# Patient Record
Sex: Male | Born: 1980 | Race: White | Hispanic: No | Marital: Married | Smoking: Current every day smoker
Health system: Southern US, Community
[De-identification: ages and names within clinical notes are randomized; demographics above are authoritative.]

## PROBLEM LIST (undated history)

## (undated) DIAGNOSIS — N200 Calculus of kidney: Secondary | ICD-10-CM

## (undated) DIAGNOSIS — G5600 Carpal tunnel syndrome, unspecified upper limb: Secondary | ICD-10-CM

## (undated) DIAGNOSIS — S069XAA Unspecified intracranial injury with loss of consciousness status unknown, initial encounter: Secondary | ICD-10-CM

## (undated) HISTORY — PX: LITHOTRIPSY: SUR834

---

## 2011-03-01 ENCOUNTER — Emergency Department (HOSPITAL_COMMUNITY)
Admission: EM | Admit: 2011-03-01 | Discharge: 2011-03-01 | Disposition: A | Discharge status: Home / Self Care | Payer: Self-pay | Attending: Emergency Medicine | Admitting: Emergency Medicine

## 2011-03-01 DIAGNOSIS — R209 Unspecified disturbances of skin sensation: Secondary | ICD-10-CM | POA: Insufficient documentation

## 2011-03-01 DIAGNOSIS — G5600 Carpal tunnel syndrome, unspecified upper limb: Secondary | ICD-10-CM | POA: Insufficient documentation

## 2011-03-01 DIAGNOSIS — M79609 Pain in unspecified limb: Secondary | ICD-10-CM | POA: Insufficient documentation

## 2011-04-02 ENCOUNTER — Emergency Department (HOSPITAL_COMMUNITY)
Admission: EM | Admit: 2011-04-02 | Discharge: 2011-04-02 | Disposition: A | Discharge status: Home / Self Care | Payer: Self-pay | Attending: Emergency Medicine | Admitting: Emergency Medicine

## 2011-04-02 DIAGNOSIS — G5601 Carpal tunnel syndrome, right upper limb: Secondary | ICD-10-CM

## 2011-04-02 DIAGNOSIS — Z87442 Personal history of urinary calculi: Secondary | ICD-10-CM | POA: Insufficient documentation

## 2011-04-02 DIAGNOSIS — G5600 Carpal tunnel syndrome, unspecified upper limb: Secondary | ICD-10-CM | POA: Insufficient documentation

## 2011-04-02 HISTORY — DX: Carpal tunnel syndrome, unspecified upper limb: G56.00

## 2011-04-02 HISTORY — DX: Calculus of kidney: N20.0

## 2011-04-02 MED ORDER — PREDNISONE 20 MG PO TABS: ORAL_TABLET | ORAL | Status: AC

## 2011-04-02 MED ORDER — PREDNISONE 20 MG PO TABS
60.0000 mg | ORAL_TABLET | Freq: Every day | ORAL | Status: DC
  Administered 2011-04-02: 60 mg via ORAL
  Filled 2011-04-02: qty 3

## 2011-04-02 MED ORDER — LORAZEPAM 2 MG/ML IJ SOLN
2.0000 mg | Freq: Once | INTRAMUSCULAR | Status: AC
  Administered 2011-04-02: 2 mg via INTRAMUSCULAR
  Filled 2011-04-02: qty 1

## 2011-04-02 MED ORDER — HYDROMORPHONE HCL 2 MG/ML IJ SOLN
2.0000 mg | Freq: Once | INTRAMUSCULAR | Status: AC
  Administered 2011-04-02: 2 mg via INTRAMUSCULAR
  Filled 2011-04-02: qty 1

## 2011-04-02 NOTE — ED Notes (Signed)
Has known carpal tunnel, unable to get right hand open "its locked up", +etoh noted on pts breath

## 2011-04-02 NOTE — ED Provider Notes (Signed)
History     CSN: [id number] Arrival date & time: 04/02/2011  8:59 PM  Chief Complaint  Patient presents with  . Hand Pain    HPI  (Consider location/radiation/quality/duration/timing/severity/associated sxs/prior treatment) This 30 year old right-hand-dominant male has had several months of pain in both volar wrists with numbness to his right handin the distribution of the median nerve without weakness. He has been wearing splints for several weeks to both wrists. 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 The pain is from his right volar wrist up to his right elbow along his volar forearm and he has cramps and spasms in his right hand causing him to clench his hand in a fist position. 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 His left hand is now bothering him today but over the last few days his right hand has been cramping off and on in a fist position. There is no weakness to the right hand today his baseline numbness with no change in numbness there is no redness or swelling to his right hand or forearm. He has no trauma he has no neck pain chest pain shortness of breath or abdominal pain or back pain or other concerns. 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 He has not seen an orthopedic surgeon yet. His pain is again in his right wrist and radiates to his right hand and his right forearm with a severe throbbing constant pain for days at a time. HPI  Past Medical History  Diagnosis Date  . Carpal tunnel syndrome   . Kidney stones     Past Surgical History  Procedure Date  . Lithotripsy     No family history on file.  History  Substance Use Topics  . Smoking status: Current Everyday Smoker  . Smokeless tobacco: Not on file  . Alcohol Use: Yes     daiily      Review of Systems  Review of Systems    Constitutional: Negative for fever.       10 Systems reviewed and are negative for acute change except as noted in the HPI.  HENT: Negative for congestion.   Eyes: Negative for discharge and redness.  Respiratory: Negative for cough and shortness of breath.   Cardiovascular: Negative for chest pain.  Gastrointestinal: Negative for vomiting and abdominal pain.  Musculoskeletal: Negative for back pain.  Skin: Negative for rash.  Neurological: Positive for numbness. Negative for syncope, weakness and headaches.  Psychiatric/Behavioral:       No behavior change.    Allergies  Review of patient's allergies indicates no known allergies.  Home Medications   Current Outpatient Rx  Name Route Sig Dispense Refill  . ARTHRITIS PAIN FORMULA PO Oral Take 2 tablets by mouth 4 (four) times daily as needed. For pain     . DIAZEPAM 5 MG PO TABS Oral Take 1 tablet (5 mg total) by mouth every 8 (eight) hours as needed (muscle spasm). 15 tablet 0  . OXYCODONE-ACETAMINOPHEN 5-325 MG PO TABS Oral Take 1 tablet by mouth once. 20 tablet 0  . PREDNISONE 20 MG PO TABS  2 tabs po daily x 4 days 8 tablet 0    Physical Exam    BP 102/85  Pulse 92  Temp(Src) 98.2 F (36.8 C) (Oral)  Resp 20  Ht 5\' 11"  (1.803 m)  Wt 175 lb (79.379 kg)  BMI 24.41 kg/m2  SpO2 100%  Physical Exam  Nursing note and vitals reviewed. Constitutional:       Awake, alert, nontoxic appearance.  HENT:  Head: Atraumatic.  Eyes: Right eye exhibits no discharge. Left eye exhibits no discharge.  Neck: Neck supple.  Cardiovascular: Normal rate and regular rhythm.   No murmur heard. Pulmonary/Chest: Effort normal and breath sounds normal. No respiratory distress. He has no wheezes. He has no rales. He exhibits no tenderness.  Abdominal: Soft. There is no tenderness. There is no rebound.  Musculoskeletal: He exhibits tenderness. He exhibits no edema.       Baseline ROM except right hand and wrist, no obvious new focal weakness.   His left arm and both legs are nontender. His right arm is nontender the shoulder upper arm and elbow. His right forearm is tender mildly to the volar forearm he also is mild tenderness to the right volar wrist he has no tenderness to the right hand but holds his right hand clenched fist and flexes against resistance when I passively tried to extend any of his digits of his right hand. His capillary refill less than 2 seconds to all digits to the right hand with slight decreased light touch to the right thumb index long and part of the ring finger and the median nerve distribution. He has normal light touch to the right small finger.  Neurological:       Mental status and motor strength appears baseline for patient and situation.  Skin: No rash noted.  Psychiatric: He has a normal mood and affect.    ED Course  Procedures (including critical care time)  Labs Reviewed - No data to display    1. Carpal tunnel syndrome of right wrist      MDM         Hurman Horn, MD 04/10/11 1115

## 2011-04-02 NOTE — ED Notes (Signed)
Pt states was seen at [facility] & dx w/ carpal tunnel, was seen in [place] & given valium. Pt states took 1 valium tab this morning & another again tonight. Pt right hand is clenched & pt states he is unable to open.

## 2011-04-05 ENCOUNTER — Emergency Department (HOSPITAL_COMMUNITY)
Admission: EM | Admit: 2011-04-05 | Discharge: 2011-04-05 | Disposition: A | Discharge status: Home / Self Care | Payer: Self-pay | Attending: Emergency Medicine | Admitting: Emergency Medicine

## 2011-04-05 ENCOUNTER — Encounter (HOSPITAL_COMMUNITY): Payer: Self-pay

## 2011-04-05 DIAGNOSIS — M62838 Other muscle spasm: Secondary | ICD-10-CM | POA: Insufficient documentation

## 2011-04-05 DIAGNOSIS — G5600 Carpal tunnel syndrome, unspecified upper limb: Secondary | ICD-10-CM | POA: Insufficient documentation

## 2011-04-05 DIAGNOSIS — Z87442 Personal history of urinary calculi: Secondary | ICD-10-CM | POA: Insufficient documentation

## 2011-04-05 DIAGNOSIS — F17200 Nicotine dependence, unspecified, uncomplicated: Secondary | ICD-10-CM | POA: Insufficient documentation

## 2011-04-05 MED ORDER — DIAZEPAM 5 MG PO TABS
5.0000 mg | ORAL_TABLET | Freq: Once | ORAL | Status: AC
  Administered 2011-04-05: 5 mg via ORAL
  Filled 2011-04-05: qty 1

## 2011-04-05 MED ORDER — DIAZEPAM 5 MG PO TABS: 5.0000 mg | ORAL_TABLET | Freq: Three times a day (TID) | ORAL | Status: AC | PRN

## 2011-04-05 MED ORDER — OXYCODONE-ACETAMINOPHEN 5-325 MG PO TABS: 1.0000 | ORAL_TABLET | Freq: Once | ORAL | Status: AC

## 2011-04-05 MED ORDER — KETOROLAC TROMETHAMINE 60 MG/2ML IM SOLN
60.0000 mg | Freq: Once | INTRAMUSCULAR | Status: AC
  Administered 2011-04-05: 60 mg via INTRAMUSCULAR
  Filled 2011-04-05: qty 2

## 2011-04-05 MED ORDER — HYDROMORPHONE HCL 1 MG/ML IJ SOLN
1.0000 mg | Freq: Once | INTRAMUSCULAR | Status: AC
  Administered 2011-04-05: 1 mg via INTRAMUSCULAR
  Filled 2011-04-05: qty 1

## 2011-04-05 MED ORDER — OXYCODONE-ACETAMINOPHEN 5-325 MG PO TABS
1.0000 | ORAL_TABLET | Freq: Once | ORAL | Status: AC
  Administered 2011-04-05: 1 via ORAL
  Filled 2011-04-05: qty 1

## 2011-04-05 NOTE — ED Notes (Signed)
Pt states he was recently seen and treated for carpal tunnel in rt hand. Pt states " my hand locks up" pt states he is unable to use hand. Pt states thumb and 2 fingers are numb.

## 2011-04-12 NOTE — ED Provider Notes (Signed)
History     CSN: [id number] Arrival date & time: 04/05/2011  8:29 PM  Chief Complaint  Patient presents with  . Hand Pain    (Consider location/radiation/quality/duration/timing/severity/associated sxs/prior treatment) Patient is a 30 y.o. male presenting with hand pain. The history is provided by the patient.  Hand Pain This is a recurrent problem. The current episode started 1 to 4 weeks ago. The problem occurs intermittently. The problem has been gradually worsening (he describes intermittent pain with "locking" up of his fingers and wrist intermittently for the past month.  He was seen here and diagnosed with carpal tunnel syndrome.    He has not followed up with any specialists yet.). Associated symptoms include arthralgias and numbness. Pertinent negatives include no abdominal pain, chest pain, congestion, fever, headaches, joint swelling, nausea, neck pain, rash, sore throat or weakness. Associated symptoms comments: He was here while his girlfriend is being treated in the ed when he developed a new muscle spasm in his hand,  So decided to be re-evaluated.  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  He also has complaint of numbness in his hands and fingertips which wakes him at night.  When his right hand spasms,  He states it spasm in the position of carrying a hammer.. Exacerbated by: Overuse. Treatments tried: He has had wrist splints for the past month without relief,  mainly wears at night..  He denies trauma.   The treatment provided no relief.    Past Medical History  Diagnosis Date  . Carpal tunnel syndrome   . Kidney stones     Past Surgical History  Procedure Date  . Lithotripsy     No family history on file.  History  Substance Use Topics  . Smoking status: Current Everyday Smoker  . Smokeless tobacco: Not on file  . Alcohol Use: Yes     daiily      Review of Systems  Constitutional: Negative for fever.  HENT: Negative for  congestion, sore throat and neck pain.   Eyes: Negative.   Respiratory: Negative for chest tightness and shortness of breath.   Cardiovascular: Negative for chest pain.  Gastrointestinal: Negative for nausea and abdominal pain.  Genitourinary: Negative.   Musculoskeletal: Positive for arthralgias. Negative for joint swelling.  Skin: Negative.  Negative for rash and wound.  Neurological: Positive for numbness. Negative for dizziness, weakness, light-headedness and headaches.  Hematological: Negative.   Psychiatric/Behavioral: Negative.     Allergies  Review of patient's allergies indicates no known allergies.  Home Medications   Current Outpatient Rx  Name Route Sig Dispense Refill  . ARTHRITIS PAIN FORMULA PO Oral Take 2 tablets by mouth 4 (four) times daily as needed. For pain     . DIAZEPAM 5 MG PO TABS Oral Take 1 tablet (5 mg total) by mouth every 8 (eight) hours as needed (muscle spasm). 15 tablet 0  . OXYCODONE-ACETAMINOPHEN 5-325 MG PO TABS Oral Take 1 tablet by mouth once. 20 tablet 0  . PREDNISONE 20 MG PO TABS  2 tabs po daily x 4 days 8 tablet 0    BP 129/73  Pulse 98  Temp(Src) 97.9 F (36.6 C) (Oral)  Resp 16  Ht 5\' 11"  (1.803 m)  Wt 175 lb (79.379 kg)  BMI 24.41 kg/m2  SpO2 99%  Physical Exam  Nursing note and vitals reviewed. Constitutional: He is oriented to person, place, and time. He appears well-developed and well-nourished.  HENT:  Head: Normocephalic and atraumatic.  Eyes: Conjunctivae are normal.  Neck: Normal range of motion.  Cardiovascular: Normal rate and intact distal pulses.   Pulmonary/Chest: Effort normal.  Abdominal: Soft. There is no tenderness.  Musculoskeletal: He exhibits tenderness. He exhibits no edema.       Right hand: He exhibits decreased range of motion and tenderness. He exhibits normal capillary refill and no swelling. normal sensation noted. Normal strength noted.       Hand and fingers of right hand held in flexion with  active resistance with passive attempt to extend digits and wrist.  Cap refill less than 2 seconds.  Mild ttp volar wrist and hand.  Slight decreased sensation to all digits and volar hand.   Neurological: He is alert and oriented to person, place, and time.  Skin: Skin is warm and dry.  Psychiatric: He has a normal mood and affect.    ED Course  Procedures (including critical care time)  Labs Reviewed - No data to display No results found.   1. Muscle spasm       MDM  Muscle spasm,  Probable carpal tunnel.  Referral to neurology for emg testing.  Patient given valium 5 mg PO in ed with little relief.  Dilaudid 1 mg IM given with improved pain and flexibility after tx.          Candis Musa, PA 04/13/11 [phone]

## 2011-04-16 NOTE — ED Provider Notes (Signed)
Medical screening examination/treatment/procedure(s) were performed by non-physician practitioner and as supervising physician I was immediately available for consultation/collaboration.   Geoffery Lyons, MD 04/16/11 2029

## 2011-10-20 ENCOUNTER — Emergency Department (HOSPITAL_COMMUNITY)
Admission: EM | Admit: 2011-10-20 | Discharge: 2011-10-20 | Disposition: A | Discharge status: Home / Self Care | Payer: Self-pay | Attending: Emergency Medicine | Admitting: Emergency Medicine

## 2011-10-20 ENCOUNTER — Emergency Department (HOSPITAL_COMMUNITY): Payer: Self-pay

## 2011-10-20 ENCOUNTER — Encounter (HOSPITAL_COMMUNITY): Payer: Self-pay | Admitting: *Deleted

## 2011-10-20 DIAGNOSIS — R296 Repeated falls: Secondary | ICD-10-CM | POA: Insufficient documentation

## 2011-10-20 DIAGNOSIS — Y92009 Unspecified place in unspecified non-institutional (private) residence as the place of occurrence of the external cause: Secondary | ICD-10-CM | POA: Insufficient documentation

## 2011-10-20 DIAGNOSIS — S62339A Displaced fracture of neck of unspecified metacarpal bone, initial encounter for closed fracture: Secondary | ICD-10-CM

## 2011-10-20 DIAGNOSIS — S62309A Unspecified fracture of unspecified metacarpal bone, initial encounter for closed fracture: Secondary | ICD-10-CM | POA: Insufficient documentation

## 2011-10-20 DIAGNOSIS — F17200 Nicotine dependence, unspecified, uncomplicated: Secondary | ICD-10-CM | POA: Insufficient documentation

## 2011-10-20 DIAGNOSIS — G5600 Carpal tunnel syndrome, unspecified upper limb: Secondary | ICD-10-CM | POA: Insufficient documentation

## 2011-10-20 IMAGING — CR DG HAND COMPLETE 3+V*R*
3 series · 3 of 3 positions shown · non-contrast
Comparison: None.

CLINICAL DATA: The patient states fifth metacarpal fracture 2
months ago, fall today.

RIGHT HAND - COMPLETE 3+ VIEW

[x hand pa right]
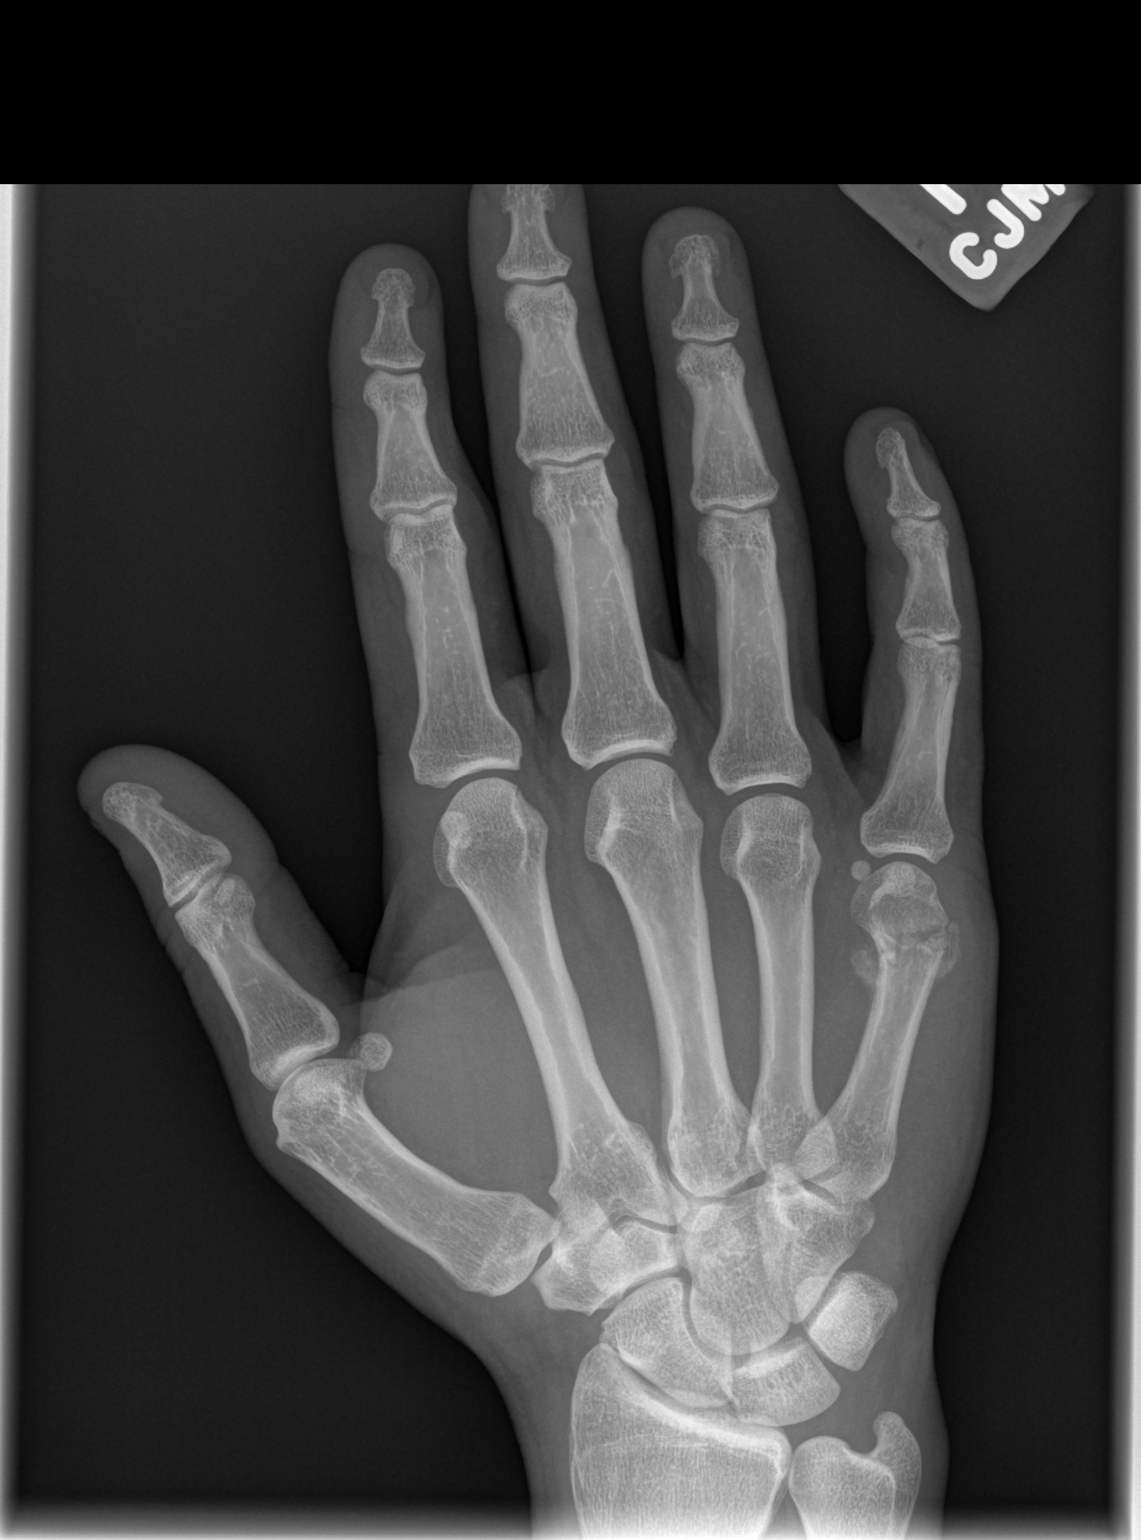

[x hand oblique right]
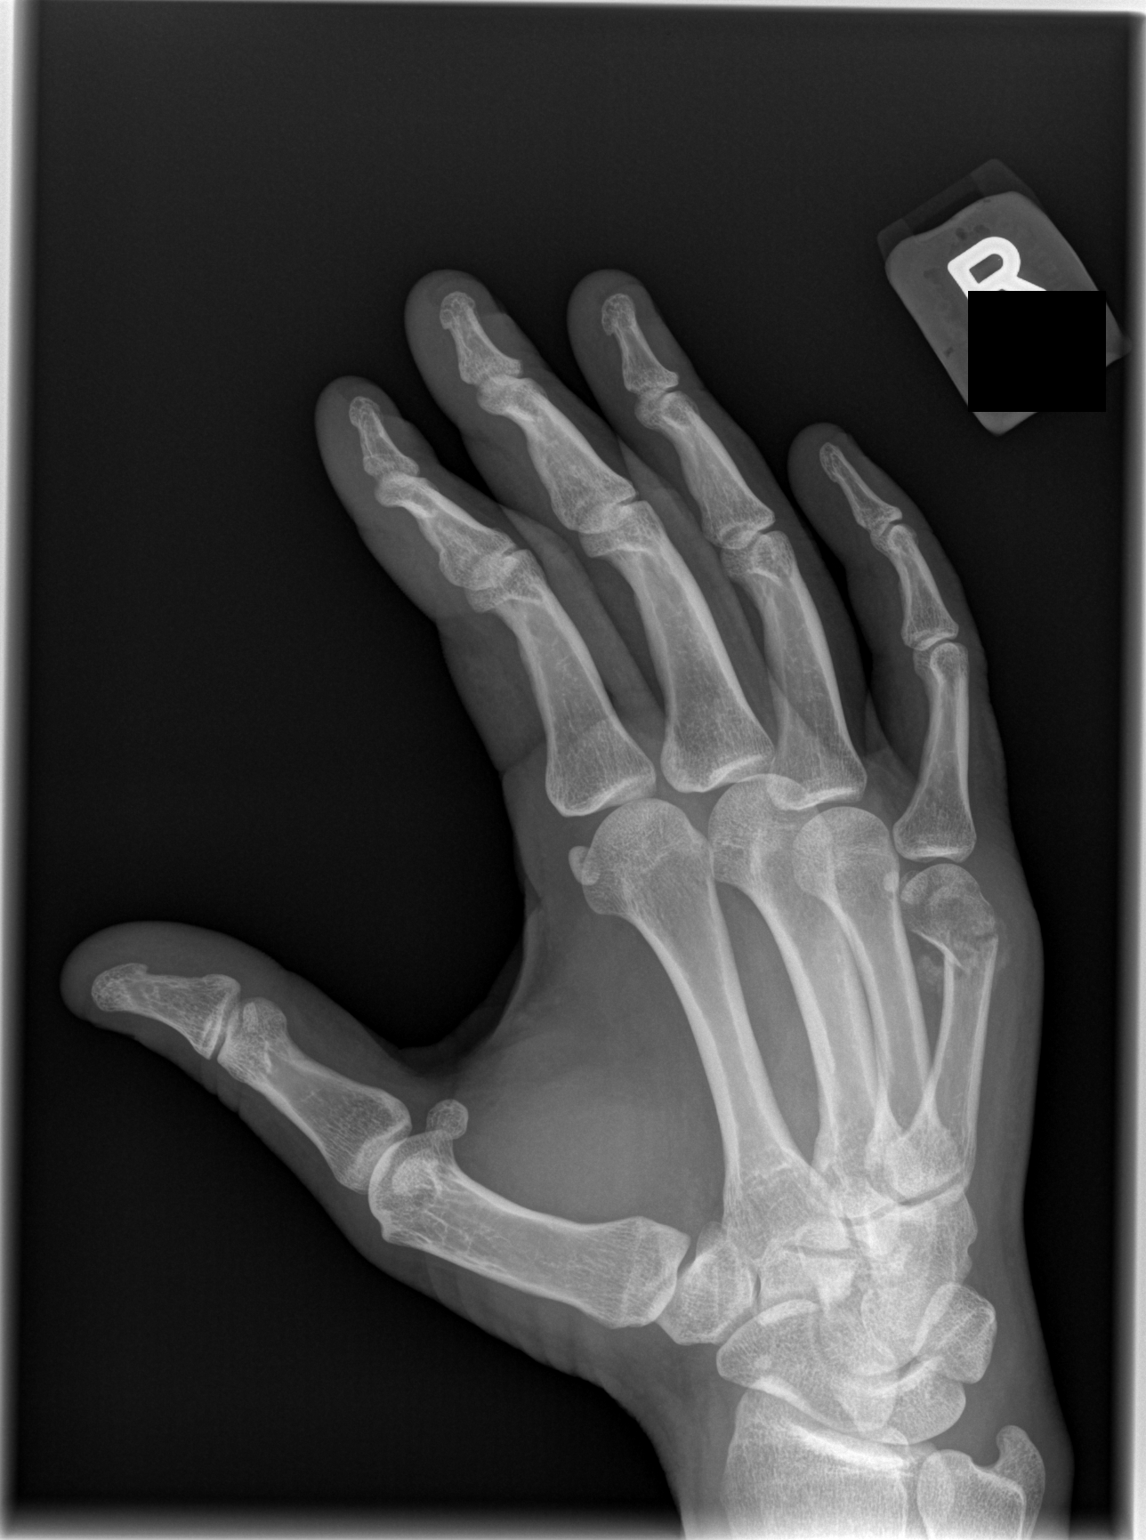

[x hand lat right]
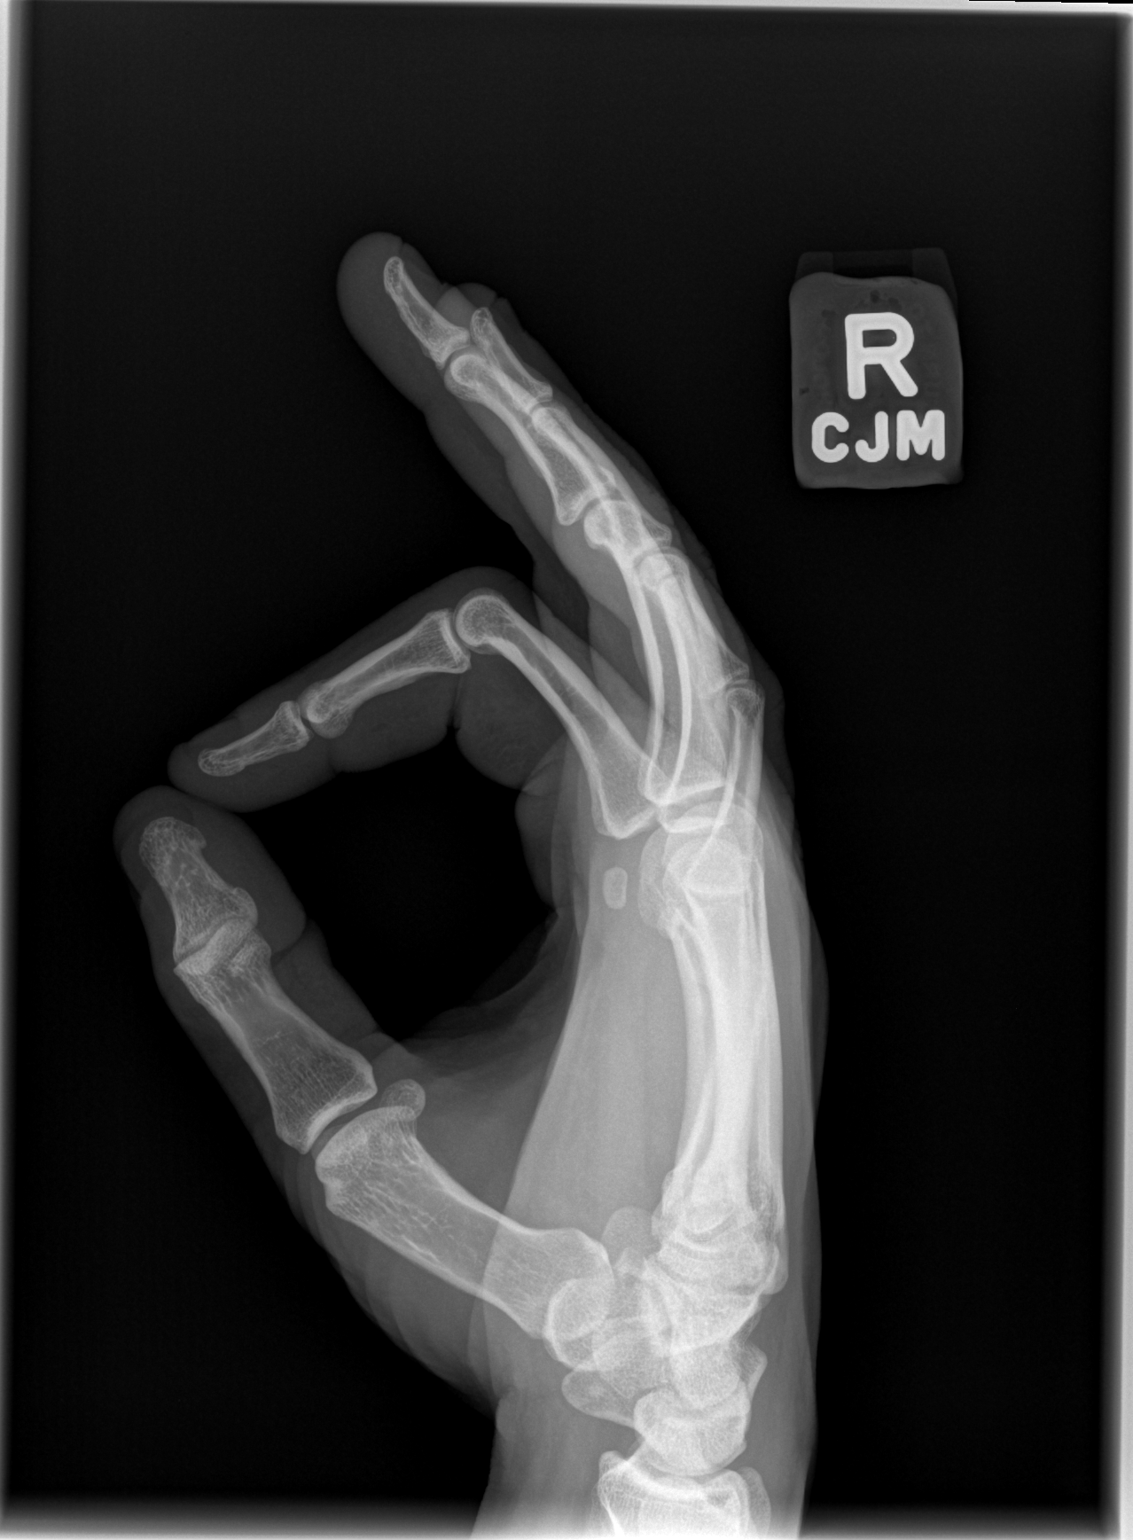

[3 of 3 positions shown; findings below may reference images not displayed]

FINDINGS: There is a subacute appearing fracture of the fifth
metacarpal neck.  This is incompletely healed with persistent
lucency and apex dorsal angulation.  There is some surrounding
callus formation.  There is no evidence of acute fracture or
dislocation.
IMPRESSION: Subacute boxer's fracture with incomplete healing and mild
angulation as described.

## 2011-10-20 MED ORDER — HYDROCODONE-ACETAMINOPHEN 5-325 MG PO TABS: 1.0000 | ORAL_TABLET | Freq: Four times a day (QID) | ORAL | Status: AC | PRN

## 2011-10-20 NOTE — ED Notes (Signed)
No answer x1

## 2011-10-20 NOTE — ED Notes (Signed)
The pt has rt hand pain that was unjured one month ago and he reinjured it again today. While wrestling.  Pain over the rt 4th and 5th metacarpal

## 2011-10-20 NOTE — ED Notes (Signed)
Ortho tech paged for splint.

## 2011-10-20 NOTE — ED Notes (Signed)
Ice pack given

## 2011-10-20 NOTE — Discharge Instructions (Signed)
Follow up with dr. Mina Marble next week.

## 2011-10-20 NOTE — ED Provider Notes (Cosign Needed)
History     CSN: [id number]  Arrival date & time 10/20/11  1728   First MD Initiated Contact with Patient 10/20/11 1912      Chief Complaint  Patient presents with  . Hand Injury    (Consider location/radiation/quality/duration/timing/severity/associated sxs/prior treatment) Patient is a 31 y.o. male presenting with hand injury. The history is provided by the patient (The patient states that he broke his hand couple weeks ago and then fell on his hand again today and had severe pain to his right hand over his fifth Mcp). No language interpreter was used.  Hand Injury  The incident occurred yesterday. The incident occurred at home. The injury mechanism was a fall. The pain is present in the right hand. The quality of the pain is described as sharp. The pain is at a severity of 5/10. The pain is moderate. The pain has been constant since the incident. Pertinent negatives include no fever. He reports no foreign bodies present. The symptoms are aggravated by movement. He has tried nothing for the symptoms. The treatment provided moderate relief.    Past Medical History  Diagnosis Date  . Carpal tunnel syndrome   . Kidney stones     Past Surgical History  Procedure Date  . Lithotripsy     No family history on file.  History  Substance Use Topics  . Smoking status: Current Everyday Smoker  . Smokeless tobacco: Not on file  . Alcohol Use: Yes     daiily      Review of Systems  Constitutional: Negative for fever and fatigue.  HENT: Negative for congestion, sinus pressure and ear discharge.   Eyes: Negative for discharge.  Respiratory: Negative for cough.   Cardiovascular: Negative for chest pain.  Gastrointestinal: Negative for abdominal pain and diarrhea.  Genitourinary: Negative for frequency and hematuria.  Musculoskeletal: Negative for back pain.  Skin: Negative for rash.  Neurological: Negative for seizures and headaches.  Hematological: Negative.     Psychiatric/Behavioral: Negative for hallucinations.    Allergies  Review of patient's allergies indicates no known allergies.  Home Medications   Current Outpatient Rx  Name Route Sig Dispense Refill  . HYDROCODONE-ACETAMINOPHEN 5-325 MG PO TABS Oral Take 1 tablet by mouth every 6 (six) hours as needed for pain. 20 tablet 0    BP 113/71  Pulse 98  Temp(Src) 98.4 F (36.9 C) (Oral)  Resp 18  SpO2 98%  Physical Exam  Constitutional: He is oriented to person, place, and time. He appears well-developed.  HENT:  Head: Normocephalic.  Eyes: Conjunctivae are normal.  Neck: No tracheal deviation present.  Cardiovascular:  No murmur heard. Musculoskeletal:       Tender swelling over right mcp  Neurological: He is oriented to person, place, and time.  Skin: Skin is warm.  Psychiatric: He has a normal mood and affect.    ED Course  Procedures (including critical care time)  Labs Reviewed - No data to display Dg Hand Complete Right  10/20/2011  *RADIOLOGY REPORT*  Clinical Data: The patient states fifth metacarpal fracture 2 months ago, fall today.  RIGHT HAND - COMPLETE 3+ VIEW  Comparison: None.  Findings: There is a subacute appearing fracture of the fifth metacarpal neck.  This is incompletely healed with persistent lucency and apex dorsal angulation.  There is some surrounding callus formation.  There is no evidence of acute fracture or dislocation.  IMPRESSION: Subacute boxer's fracture with incomplete healing and mild angulation as described.  Original Report Authenticated  By: Gerrianne Scale, M.D.     1. Boxer's fracture       MDM          Benny Lennert, MD 10/20/11 [phone]

## 2011-10-20 NOTE — Progress Notes (Signed)
Orthopedic Tech Progress Note Patient Details:  Yama Nielson Oct 31, 1980 [id number]  Type of Splint: Other (comment) (ulna gutter) Splint Location: (R) UE Splint Interventions: Application    Jennye Moccasin 10/20/2011, 8:19 PM

## 2011-11-22 ENCOUNTER — Emergency Department (HOSPITAL_COMMUNITY)
Admission: EM | Admit: 2011-11-22 | Discharge: 2011-11-22 | Discharge status: Against Medical Advice | Payer: Self-pay | Attending: Emergency Medicine | Admitting: Emergency Medicine

## 2011-11-22 ENCOUNTER — Encounter (HOSPITAL_COMMUNITY): Payer: Self-pay | Admitting: *Deleted

## 2011-11-22 DIAGNOSIS — R079 Chest pain, unspecified: Secondary | ICD-10-CM | POA: Insufficient documentation

## 2011-11-22 LAB — DIFFERENTIAL
Basophils Relative: 0 % (ref 0–1)
Eosinophils Relative: 2 % (ref 0–5)
Monocytes Absolute: 0.5 10*3/uL (ref 0.1–1.0)
Monocytes Relative: 5 % (ref 3–12)
Neutro Abs: 7.6 10*3/uL (ref 1.7–7.7)

## 2011-11-22 LAB — RAPID URINE DRUG SCREEN, HOSP PERFORMED
Barbiturates: NOT DETECTED
Benzodiazepines: NOT DETECTED
Cocaine: NOT DETECTED
Opiates: NOT DETECTED

## 2011-11-22 LAB — COMPREHENSIVE METABOLIC PANEL
Albumin: 4.3 g/dL (ref 3.5–5.2)
BUN: 6 mg/dL (ref 6–23)
CO2: 23 mEq/L (ref 19–32)
Calcium: 9.4 mg/dL (ref 8.4–10.5)
Chloride: 104 mEq/L (ref 96–112)
Creatinine, Ser: 0.84 mg/dL (ref 0.50–1.35)
GFR calc non Af Amer: >90 mL/min (ref >90)
Total Bilirubin: 0.2 mg/dL — ABNORMAL LOW (ref 0.3–1.2)

## 2011-11-22 LAB — URINALYSIS, ROUTINE W REFLEX MICROSCOPIC
Leukocytes, UA: NEGATIVE
Nitrite: NEGATIVE
Specific Gravity, Urine: 1.007 (ref 1.005–1.030)
Urobilinogen, UA: 0.2 mg/dL (ref 0.0–1.0)
pH: 6 (ref 5.0–8.0)

## 2011-11-22 LAB — CBC
HCT: 46.6 % (ref 39.0–52.0)
Hemoglobin: 16.5 g/dL (ref 13.0–17.0)
MCH: 32.7 pg (ref 26.0–34.0)
MCHC: 35.4 g/dL (ref 30.0–36.0)
MCV: 92.3 fL (ref 78.0–100.0)

## 2011-11-22 LAB — URINE MICROSCOPIC-ADD ON

## 2011-11-22 NOTE — ED Notes (Signed)
The pt is c/o pain in his back.  He has chest pain after he started hyperventalting  With numbness and tingling in his arms and hands.  He has multiple complaints

## 2021-06-30 ENCOUNTER — Emergency Department (HOSPITAL_COMMUNITY)
Admission: EM | Admit: 2021-06-30 | Discharge: 2021-07-01 | Disposition: A | Discharge status: Home / Self Care | Payer: Self-pay | Attending: Emergency Medicine | Admitting: Emergency Medicine

## 2021-06-30 ENCOUNTER — Other Ambulatory Visit: Payer: Self-pay

## 2021-06-30 DIAGNOSIS — F17200 Nicotine dependence, unspecified, uncomplicated: Secondary | ICD-10-CM | POA: Insufficient documentation

## 2021-06-30 DIAGNOSIS — N200 Calculus of kidney: Secondary | ICD-10-CM | POA: Insufficient documentation

## 2021-06-30 DIAGNOSIS — R531 Weakness: Secondary | ICD-10-CM

## 2021-06-30 DIAGNOSIS — G609 Hereditary and idiopathic neuropathy, unspecified: Secondary | ICD-10-CM | POA: Insufficient documentation

## 2021-06-30 DIAGNOSIS — M5136 Other intervertebral disc degeneration, lumbar region: Secondary | ICD-10-CM | POA: Insufficient documentation

## 2021-07-01 ENCOUNTER — Emergency Department (HOSPITAL_COMMUNITY): Payer: Self-pay

## 2021-07-01 ENCOUNTER — Encounter (HOSPITAL_COMMUNITY): Payer: Self-pay

## 2021-07-01 ENCOUNTER — Other Ambulatory Visit: Payer: Self-pay

## 2021-07-01 LAB — URINALYSIS, ROUTINE W REFLEX MICROSCOPIC
Glucose, UA: NEGATIVE mg/dL
Hgb urine dipstick: NEGATIVE
Ketones, ur: NEGATIVE mg/dL
Leukocytes,Ua: NEGATIVE
Nitrite: NEGATIVE
Protein, ur: 30 mg/dL — AB
Specific Gravity, Urine: >1.03 — ABNORMAL HIGH (ref 1.005–1.030)
pH: 6.5 (ref 5.0–8.0)

## 2021-07-01 LAB — URINALYSIS, MICROSCOPIC (REFLEX)

## 2021-07-01 IMAGING — CT CT RENAL STONE PROTOCOL
2 of 4 series · 16 of 46 positions shown, 18 images · non-contrast
Comparison: None.

CLINICAL DATA: Nephrolithiasis, back pain

EXAM:
CT ABDOMEN AND PELVIS WITHOUT CONTRAST
TECHNIQUE: Multidetector CT imaging of the abdomen and pelvis was performed
following the standard protocol without IV contrast.

[Series 3: ap without · axial · non-contrast · 0.82mm/px · z∈[+987,+1412]mm · 13 of 96 slices shown, 15 images]
[im 6/96  soft-tissue]
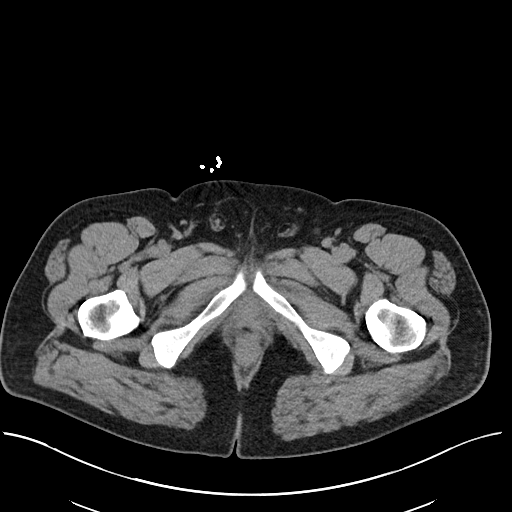
[im 6/96  bone]
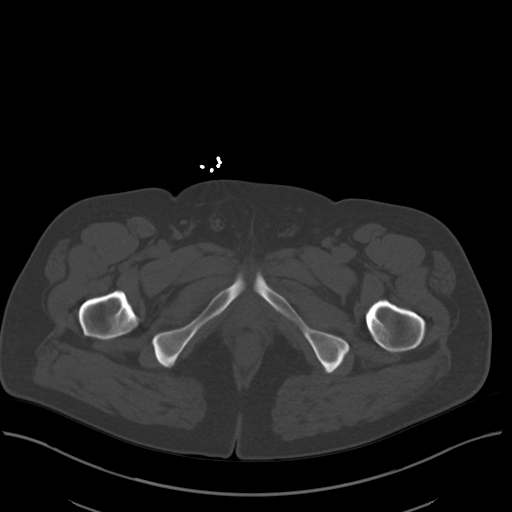
[im 16/96  soft-tissue]
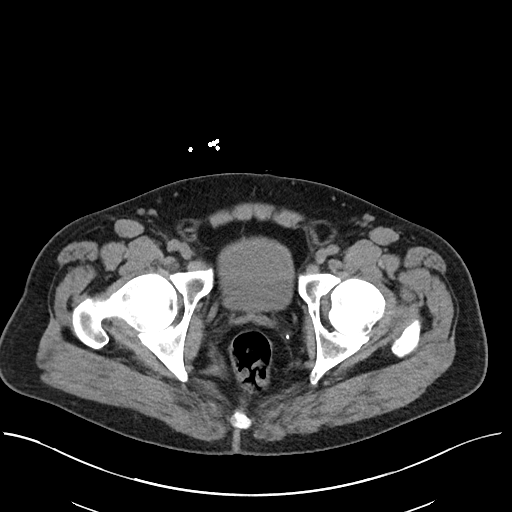
[im 21/96  soft-tissue]
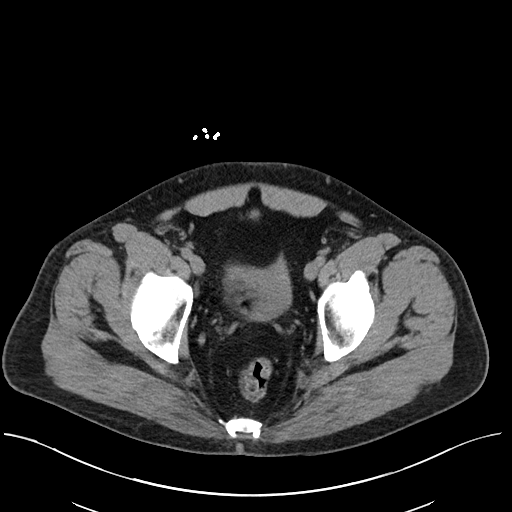
[im 26/96  soft-tissue]
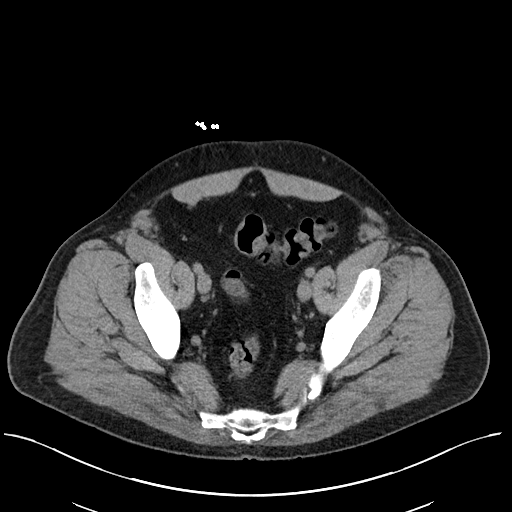
[im 36/96  soft-tissue]
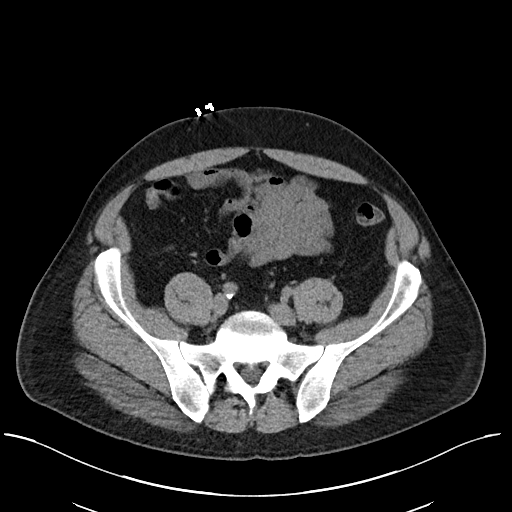
[im 41/96  soft-tissue]
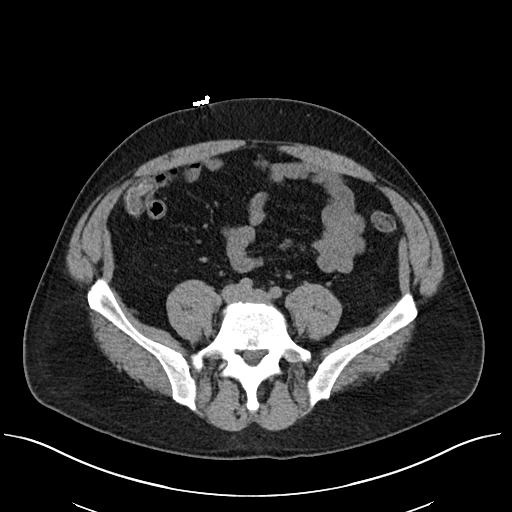
[im 51/96  soft-tissue]
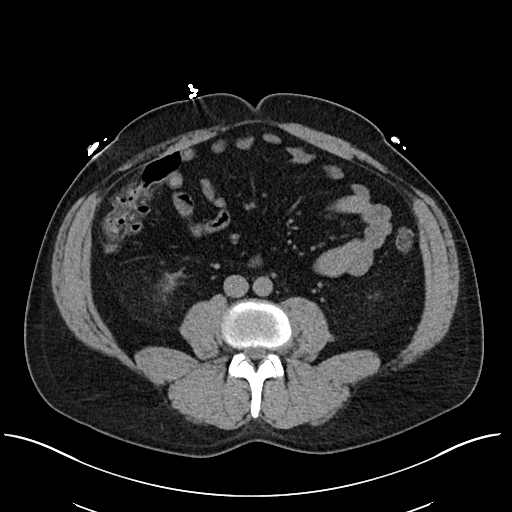
[im 56/96  soft-tissue]
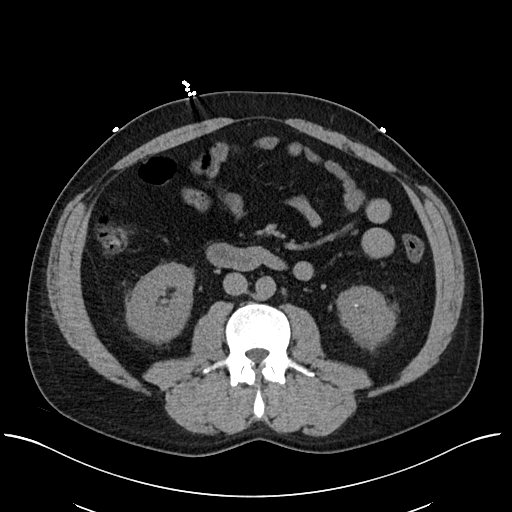
[im 61/96  soft-tissue]
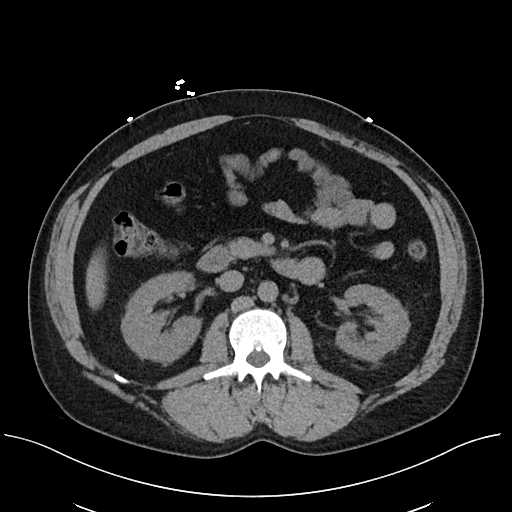
[im 61/96  bone]
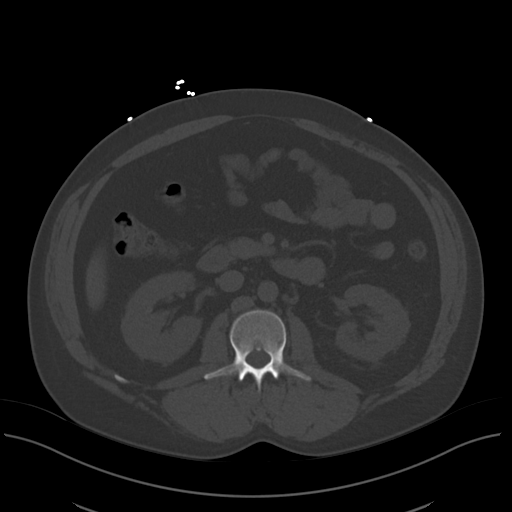
[im 71/96  soft-tissue]
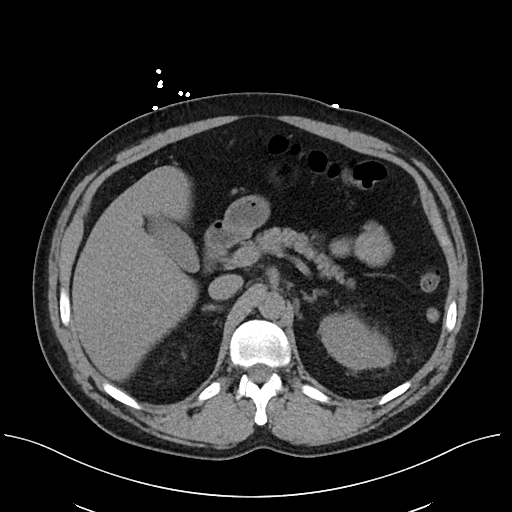
[im 76/96  soft-tissue]
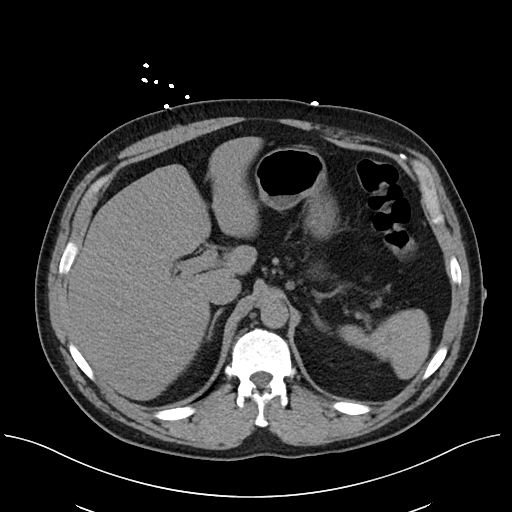
[im 81/96  soft-tissue]
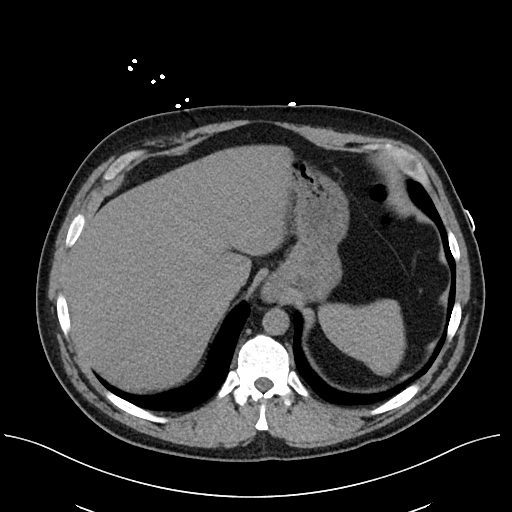
[im 91/96  soft-tissue]
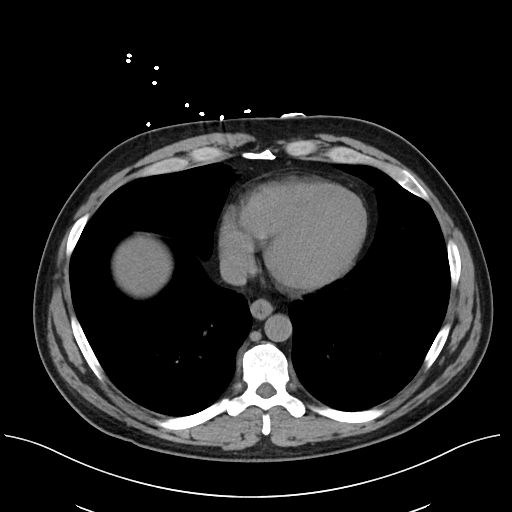

[Series 6: cor · coronal · 0.80mm/px · 3 of 105 slices shown]
[im 35/105  soft-tissue]
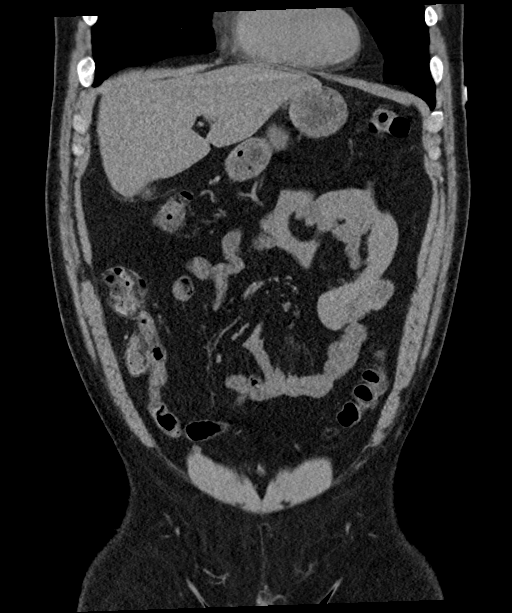
[im 47/105  soft-tissue]
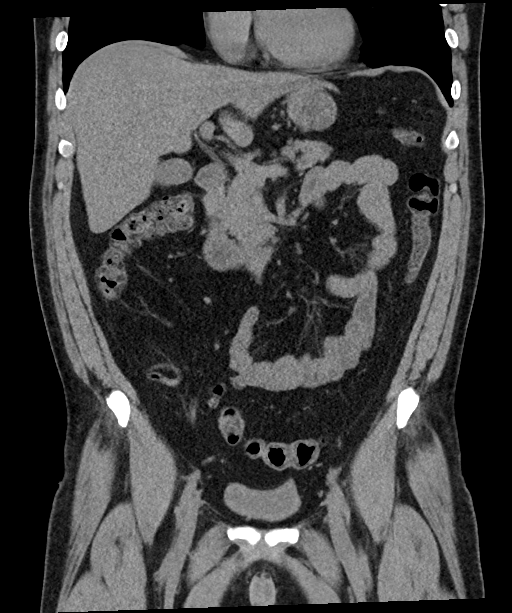
[im 58/105  soft-tissue]
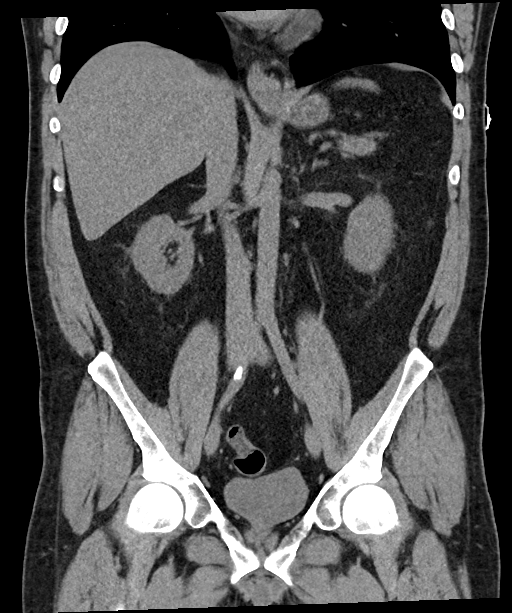

[16 of 46 positions shown; findings below may reference images not displayed]

FINDINGS: Lower chest: No acute abnormality.

Hepatobiliary: No focal liver abnormality is seen. No gallstones,
gallbladder wall thickening, or biliary dilatation.

Pancreas: Unremarkable. No pancreatic ductal dilatation or
surrounding inflammatory changes.

Spleen: Normal in size without focal abnormality.

Adrenals/Urinary Tract: Normal adrenal glands. 3 mm nonobstructing
right renal calculus. 3 mm nonobstructing left renal calculus. No
obstructive uropathy. No ureteral calculi. No renal mass. Normal
bladder.

Stomach/Bowel: Stomach is within normal limits. Appendix appears
normal. No evidence of bowel wall thickening, distention, or
inflammatory changes.

Vascular/Lymphatic: No significant vascular findings are present. No
enlarged abdominal or pelvic lymph nodes.

Reproductive: Prostate is unremarkable.

Other: No abdominal wall hernia or abnormality. No abdominopelvic
ascites.

Musculoskeletal: No acute or significant osseous findings. 7 mm
right iliac sclerotic bone lesion consistent with a bone island.
Indeterminate 12 mm sclerotic bone lesion with central lucency in
the right posterior ilium, which likely reflects a benign
fibro-osseous lesion, but cannot be definitively characterized.
Recommend a follow-up MRI of the pelvis in 6 months.
IMPRESSION: 1. Bilateral nonobstructing nephrolithiasis. No obstructive
uropathy. No ureteral or bladder calculi.
2. Indeterminate 12 mm sclerotic bone lesion with central lucency in
the right posterior ilium, which likely reflects a benign
fibro-osseous lesion, but cannot be definitively characterized.
Recommend a follow-up MRI of the pelvis in 6 months.

## 2021-07-01 IMAGING — MR MR LUMBAR SPINE W/O CM
6 of 8 series · 28 of 48 positions shown · non-contrast
Comparison: Thoracic spine MRI the same day.

CLINICAL DATA: 40-year-old male with acute back pain and sudden
onset numbness in the right leg.

EXAM:
MRI LUMBAR SPINE WITHOUT CONTRAST
TECHNIQUE: Multiplanar, multisequence MR imaging of the lumbar spine was
performed. No intravenous contrast was administered.

[Series 19: T2 · sagittal · 3.0mm · 0.76mm/px · 4 of 22 slices shown (1 of 3)]
[im 1/22]
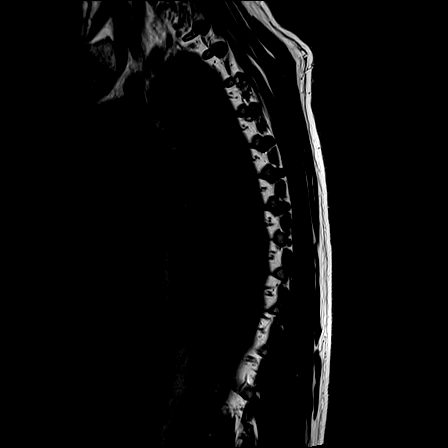
[im 8/22]
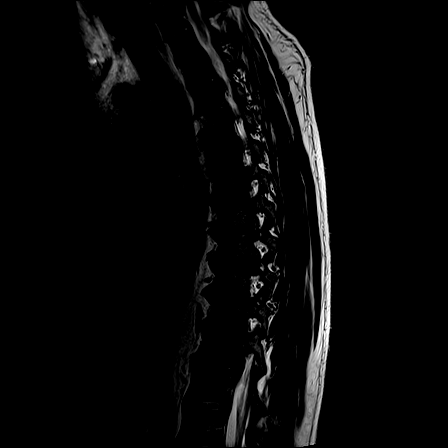
[im 15/22]
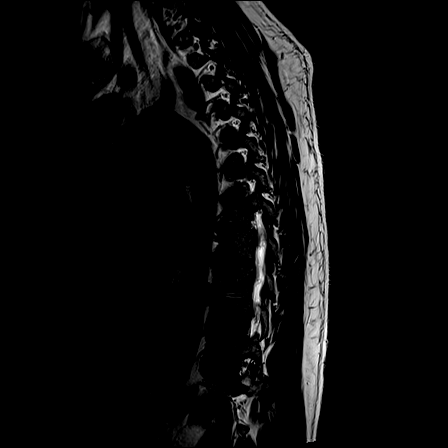
[im 22/22]
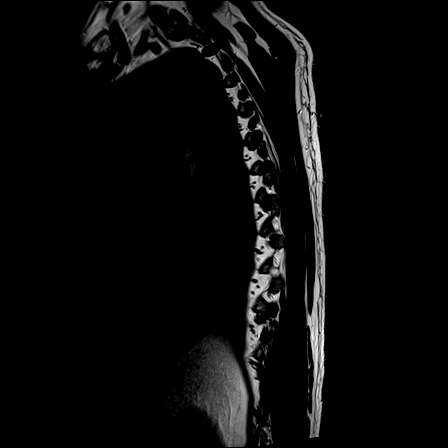

[Series 20: T1 · sagittal · 3.0mm · 0.76mm/px · 5 of 22 slices shown (1 of 3)]
[im 1/22]
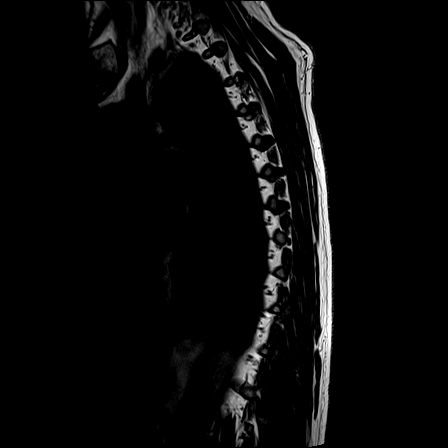
[im 6/22]
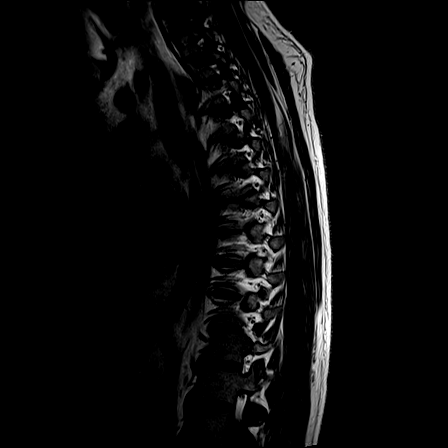
[im 11/22]
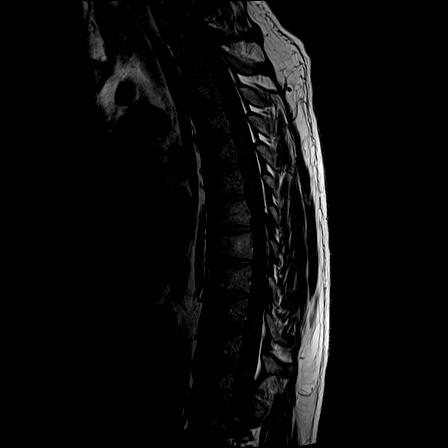
[im 16/22]
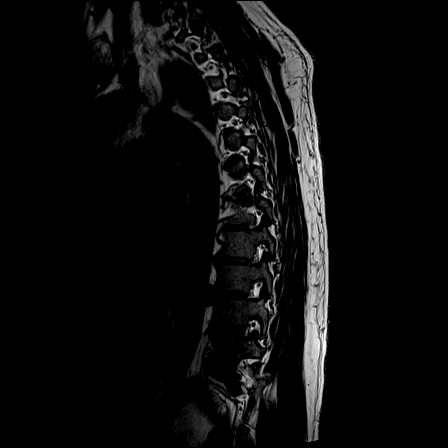
[im 22/22]
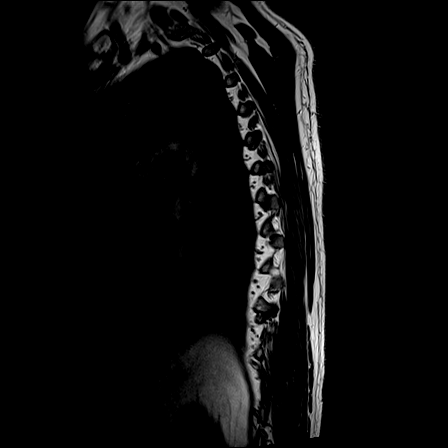

[Series 21: T2 · sagittal · 4.0mm · 0.73mm/px · 4 of 16 slices shown (2 of 3)]
[im 1/16]
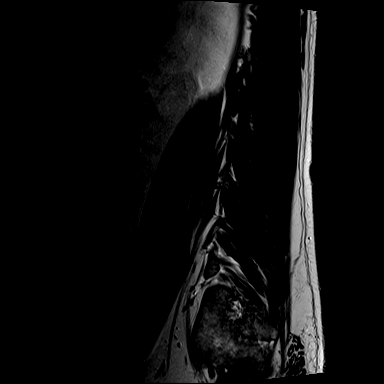
[im 6/16]
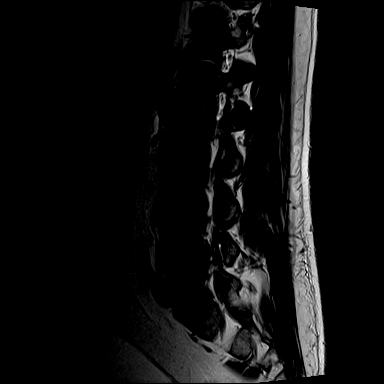
[im 11/16]
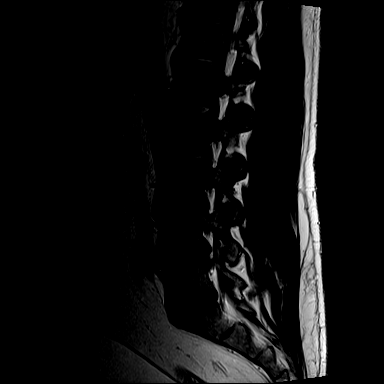
[im 16/16]
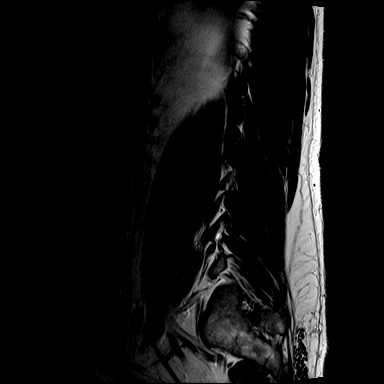

[Series 23: T1 · sagittal · 4.0mm · 0.88mm/px · 4 of 16 slices shown (2 of 3)]
[im 1/16]
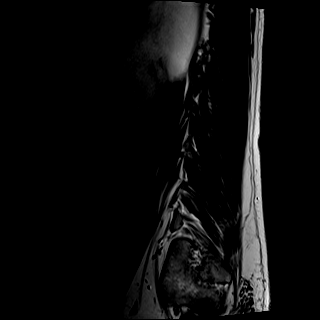
[im 6/16]
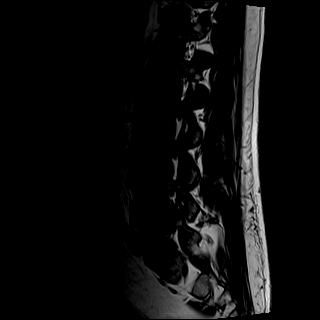
[im 11/16]
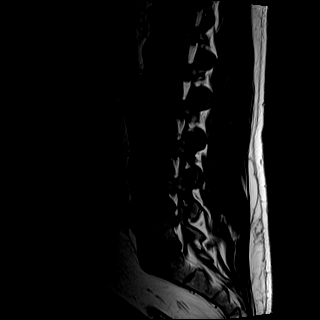
[im 16/16]
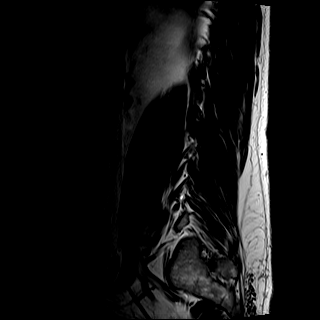

[Series 24: T2 · axial · 5.0mm · 0.57mm/px · z∈[-514,-266]mm · 9 of 36 slices shown (3 of 3)]
[im 1/36]
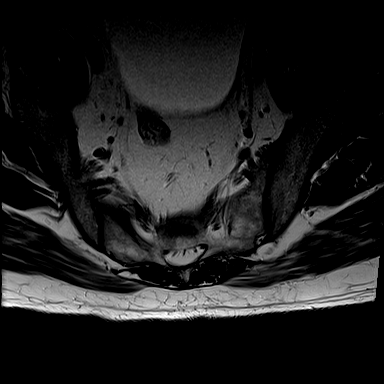
[im 5/36]
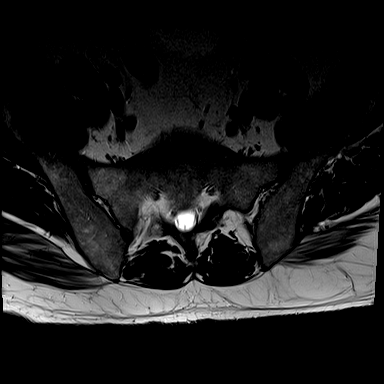
[im 9/36]
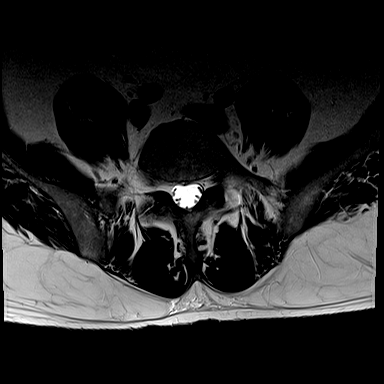
[im 14/36]
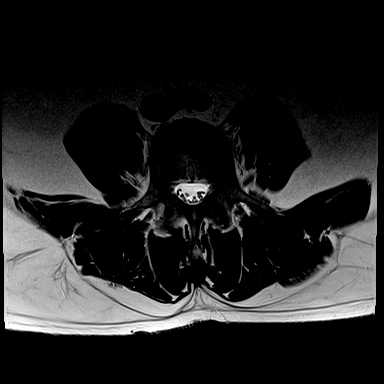
[im 18/36]
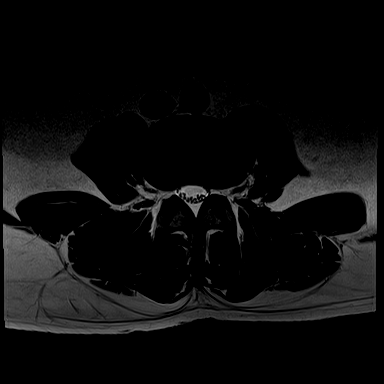
[im 22/36]
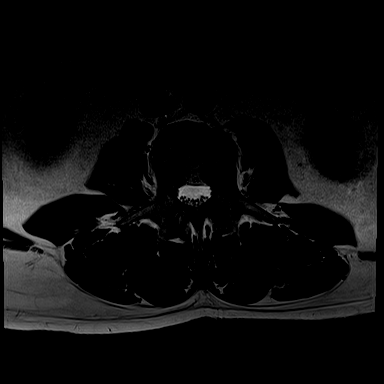
[im 27/36]
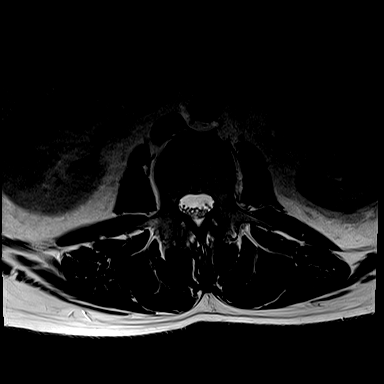
[im 31/36]
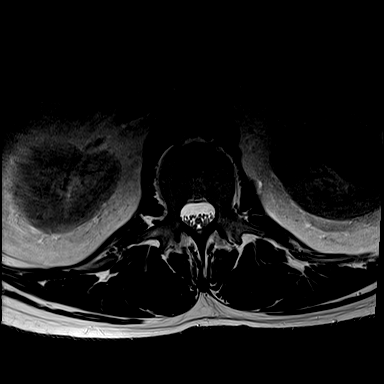
[im 36/36]
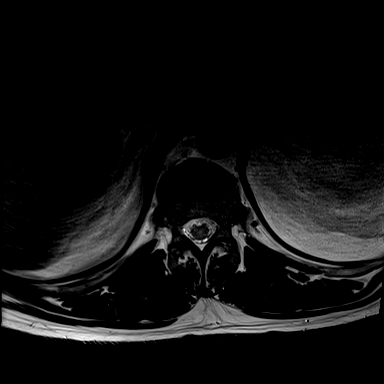

[Series 25: T1 · axial · 5.0mm · 0.34mm/px · z∈[-514,-484]mm · 2 of 36 slices shown (3 of 3)]
[im 1/36]
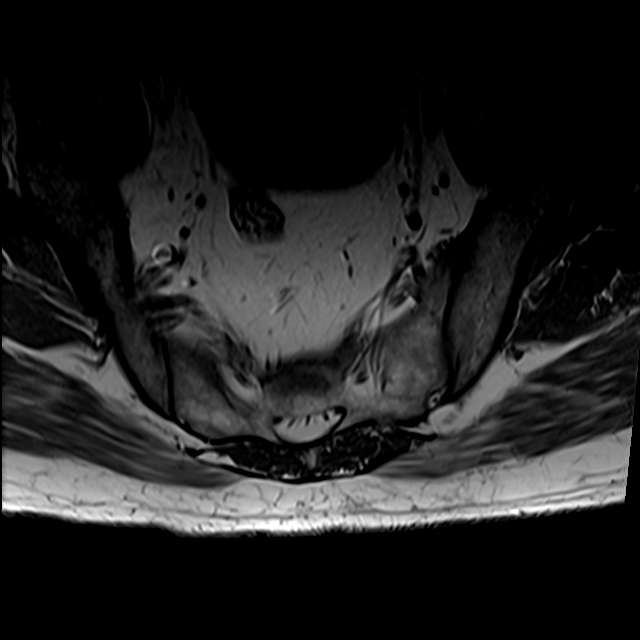
[im 5/36]
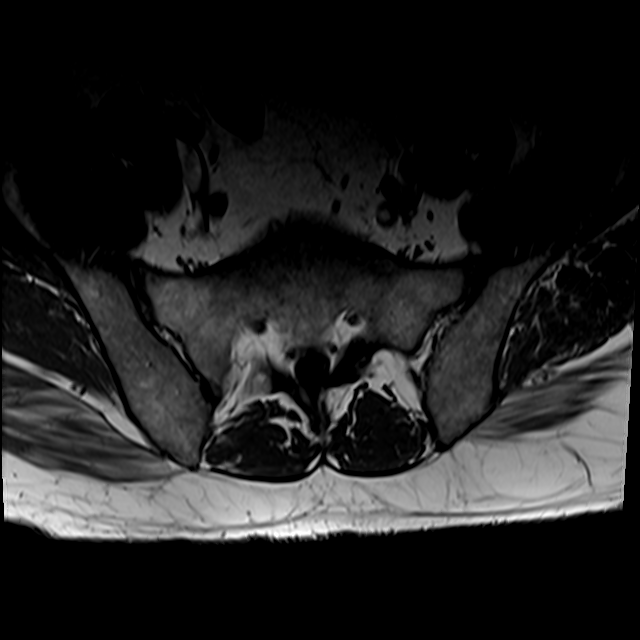

[28 of 48 positions shown; findings below may reference images not displayed]

FINDINGS: Segmentation: Normal, concordant with the thoracic spine numbering
today.

Alignment: Mild straightening of lumbar lordosis. No
spondylolisthesis.

Vertebrae: Small area of chronic degenerative endplate marrow signal
change anteriorly superiorly at L4, versus a small chronic
hemangioma there. Furthermore, a benign hemangioma of the right L4
facet is suspected on series 22, image 5. And a circumscribed 9 mm
dark T1 and T2 signal area in the visible medial right iliac bone
(series 25, image 31) is most likely a benign fibrous lesion of
bone. No convincing marrow edema or acute osseous abnormality.
Background bone marrow signal within normal limits. Intact visible
sacrum and SI joints.

Conus medullaris and cauda equina: Conus extends to the T12-L1
level. No lower spinal cord or conus signal abnormality. Normal
cauda equina nerve roots.

Paraspinal and other soft tissues: There is faint left lateral
paraspinal soft tissue inflammation adjacent to the L3-L4 disc
(series 22, images 14 and 15), see additional details of that level
below.

Other paraspinal soft tissues appear normal. Negative visible
abdominal viscera. The urinary bladder is mildly distended.

Disc levels:

L1-L2:  Negative.

L2-L3:  Negative.

L3-L4: Left foraminal to far lateral disc herniation which appears
relatively small (series 25, image 17), but is associated with left
L3 neural foraminal edema and/or trace epidural blood. Mild
inflammation along the exiting left L3 nerve. No spinal or lateral
recess involvement. Normal contralateral right L3 foramen.

L4-L5: Mild disc desiccation and circumferential disc bulge. Subtle
left foraminal annular fissure of the disc. Mild endplate spurring.
Mild facet hypertrophy. No significant spinal or lateral recess
stenosis. Mild left L4 neural foraminal stenosis.

L5-S1: Subtle midline annular fissure of the disc (series 21, image
8). Otherwise negative.
IMPRESSION: 1. Small L3-L4 left foraminal to far lateral disc herniation with
associated edema and/or trace epidural blood in the left L3 neural
foramen. Mild regional soft tissue inflammation. Query Left L3
radiculitis.

2. Otherwise mild disc and posterior element degeneration at L4-L5.
Minimal disc degeneration at L5-S1. Mild left L4 neural foraminal no
lumbar spinal stenosis.

## 2021-07-01 IMAGING — MR MR THORACIC SPINE W/O CM
4 of 6 series · 21 of 48 positions shown · non-contrast
Comparison: Lumbar MRI reported separately.

CLINICAL DATA: 40-year-old male with acute back pain and sudden
onset numbness in the right leg.

EXAM:
MRI THORACIC SPINE WITHOUT CONTRAST
TECHNIQUE: Multiplanar, multisequence MR imaging of the thoracic spine was
performed. No intravenous contrast was administered.

[Series 1: T2 · sagittal · 3.0mm · 0.76mm/px · 6 of 22 slices shown (1 of 2)]
[im 1/22]
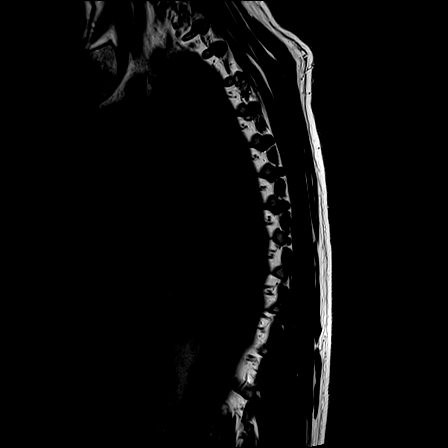
[im 5/22]
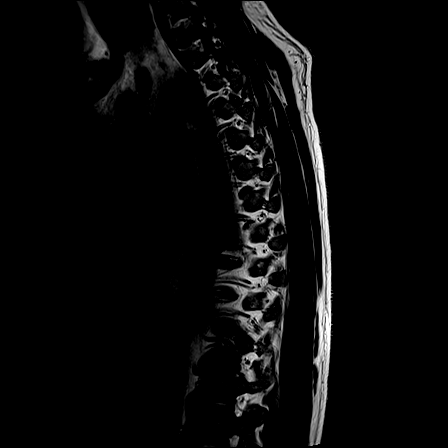
[im 9/22]
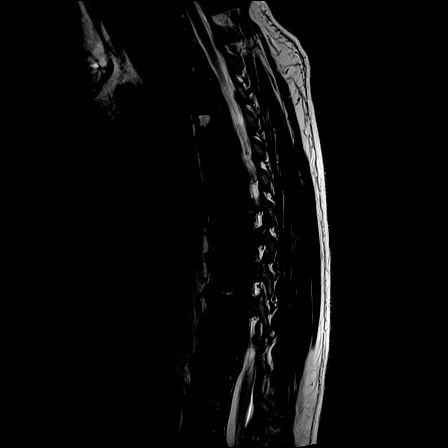
[im 13/22]
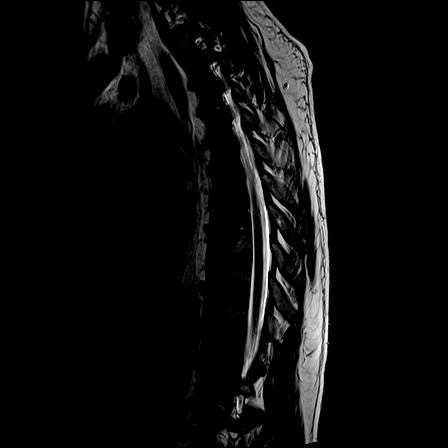
[im 17/22]
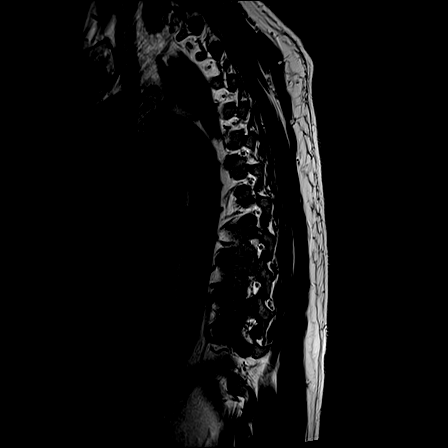
[im 22/22]
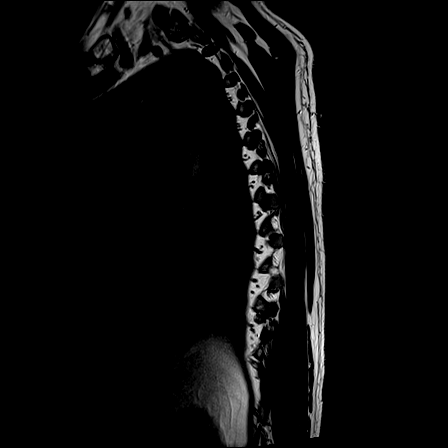

[Series 2: T1 · sagittal · 3.0mm · 0.76mm/px · 4 of 22 slices shown]
[im 1/22]
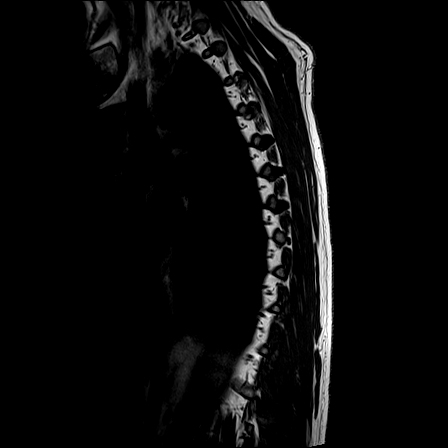
[im 5/22]
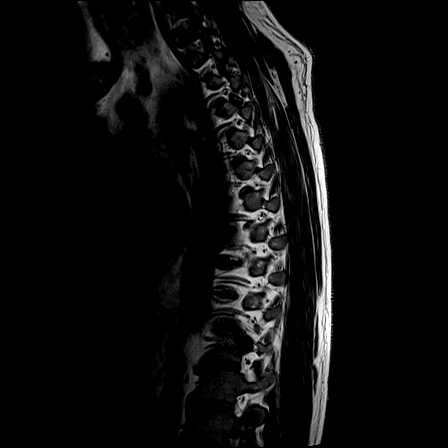
[im 13/22]
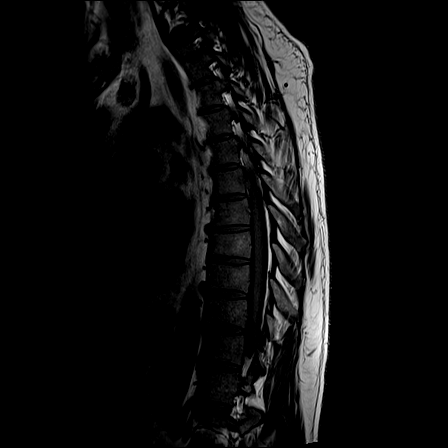
[im 22/22]
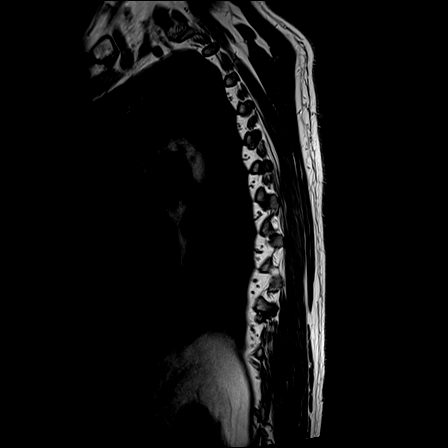

[Series 3: STIR · sagittal · 3.0mm · 0.38mm/px · 3 of 22 slices shown]
[im 5/22]
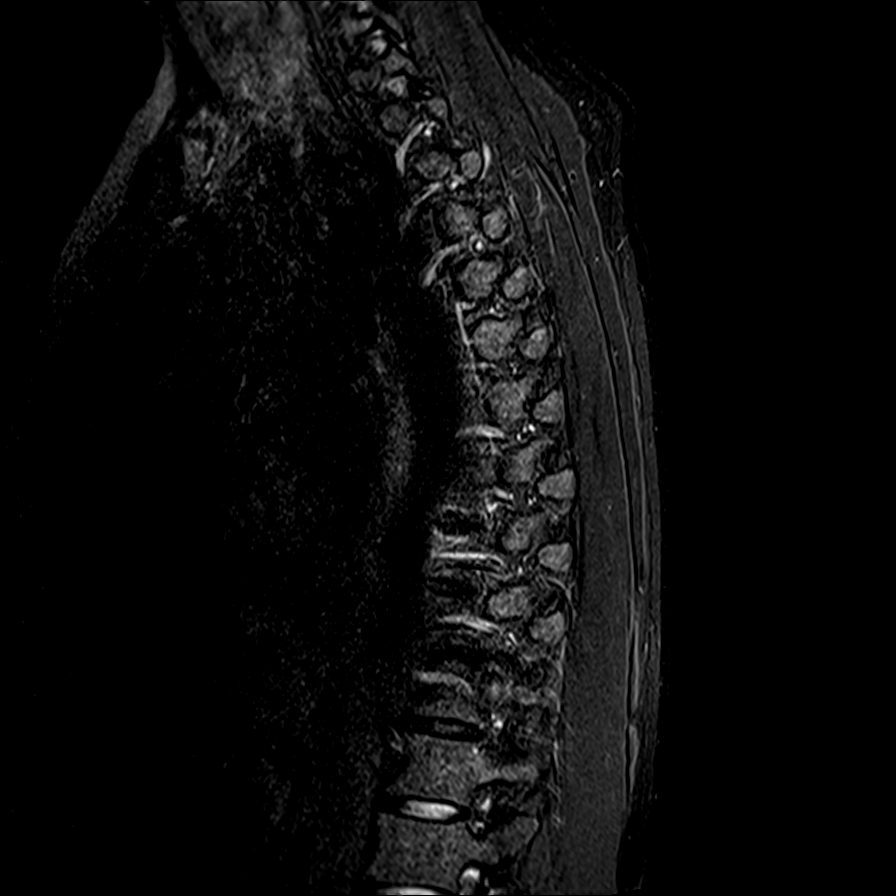
[im 13/22]
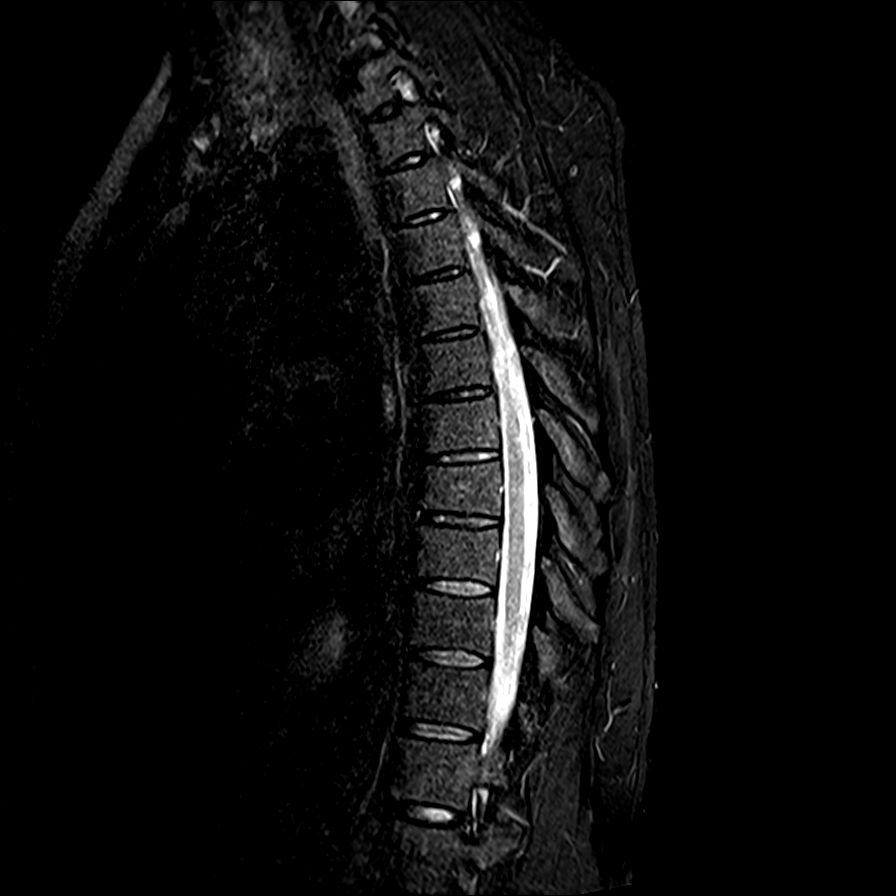
[im 22/22]
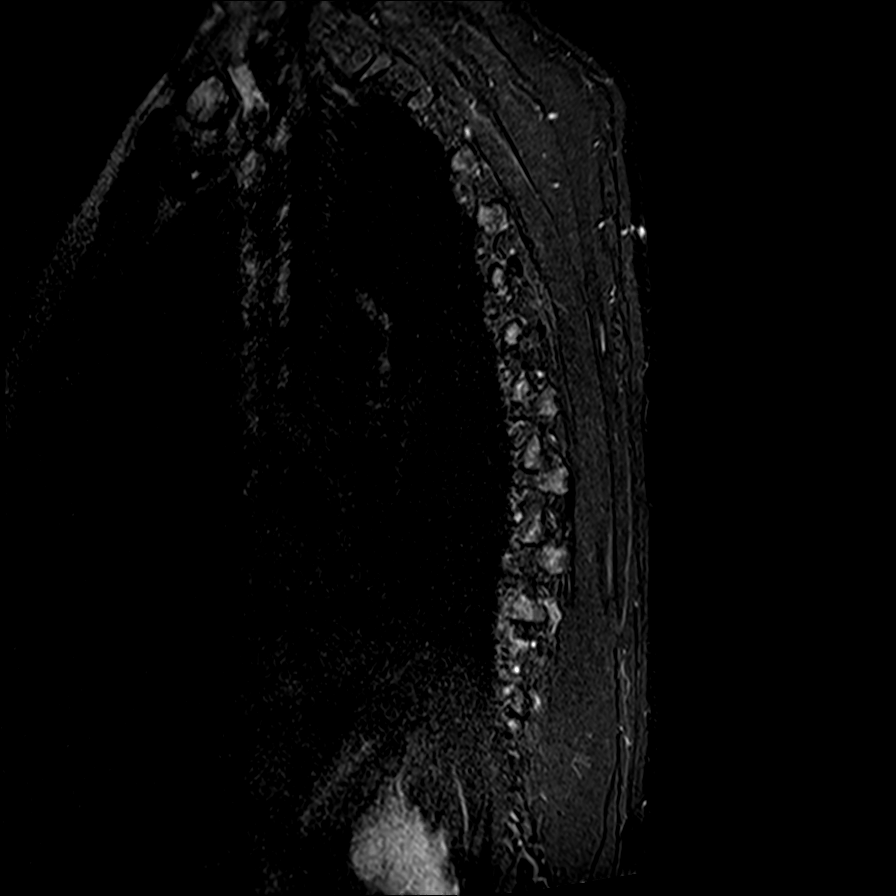

[Series 5: T2 · axial · 5.0mm · 0.59mm/px · z∈[-273,-40]mm · 8 of 39 slices shown (2 of 2)]
[im 1/39]
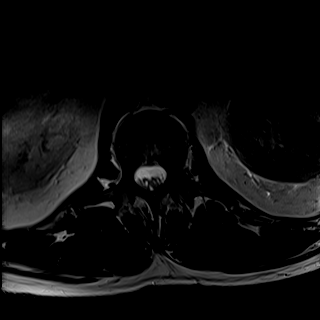
[im 5/39]
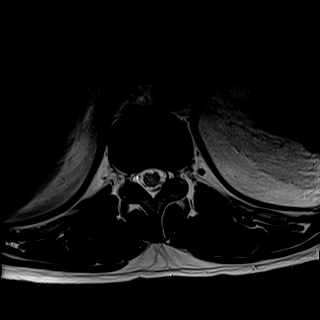
[im 13/39]
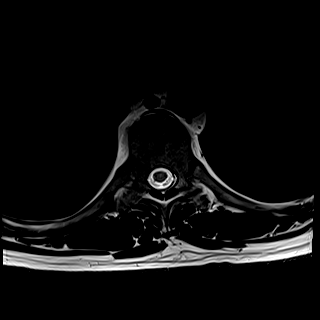
[im 17/39]
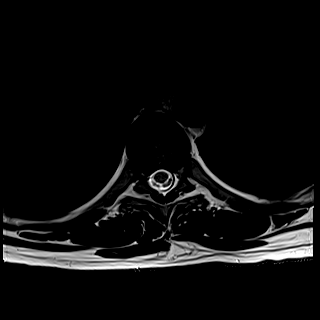
[im 22/39]
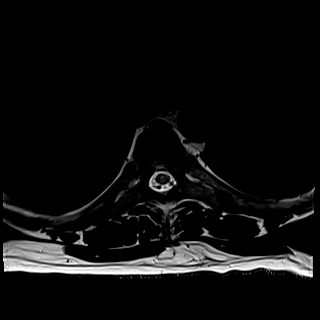
[im 26/39]
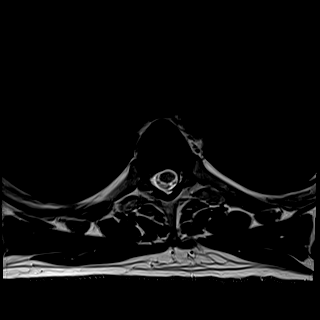
[im 34/39]
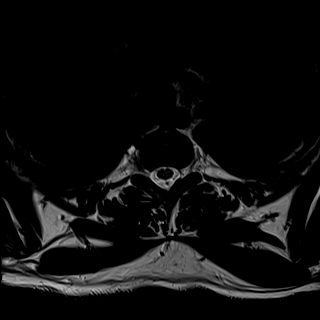
[im 39/39]
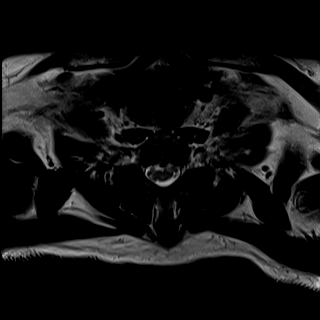

[21 of 48 positions shown; findings below may reference images not displayed]

FINDINGS: Limited cervical spine imaging: Cervicothoracic junction alignment
is within normal limits.

Thoracic spine segmentation: Normal, concordant with lumbar
numbering today.

Alignment: Normal thoracic kyphosis. No scoliosis or
spondylolisthesis.

Vertebrae: T4 benign vertebral body hemangioma. Visualized bone
marrow signal is within normal limits. No marrow edema or evidence
of acute osseous abnormality.

Cord: Normal. Conus medullaris is at T12-L1. No spinal cord signal
abnormality.

Paraspinal and other soft tissues: Negative visible chest and upper
abdominal viscera. Thoracic paraspinal soft tissues appear normal.

Disc levels:

T1-T2: Negative.

T2-T3: Negative.

T3-T4: Mild right facet hypertrophy.  No stenosis.

T4-T5: Negative.

T5-T6: Negative.

T6-T7: Negative.

T7-T8: Mild ligament flavum hypertrophy mostly on the right. No
stenosis.

T8-T9: Negative.

T9-T10: Negative.

T10-T11: Negative.

T11-T12: Negative.

T12-L1: Negative.
IMPRESSION: Essentially normal MRI appearance of the thoracic spine.
No disc herniation, spinal stenosis, or neural impingement.

## 2021-07-01 IMAGING — MR MR HEAD W/O CM
9 of 10 series · 37 of 48 positions shown · non-contrast
Comparison: None.

CLINICAL DATA: Neuro deficit, acute, stroke suspected

EXAM:
MRI HEAD WITHOUT CONTRAST
TECHNIQUE: Multiplanar, multiecho pulse sequences of the brain and surrounding
structures were obtained without intravenous contrast.

[Series 3: DWI · axial · 3.0mm · 1.09mm/px · z∈[-94,+56]mm · 9 of 104 slices shown (1 of 4)]
[im 1/104]
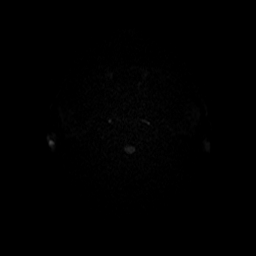
[im 13/104]
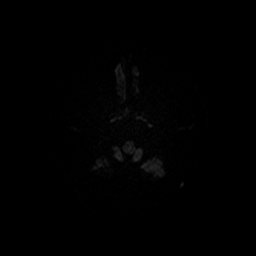
[im 26/104]
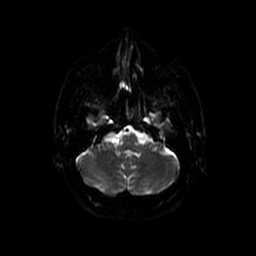
[im 39/104]
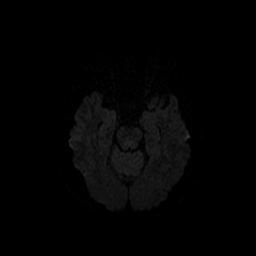
[im 52/104]
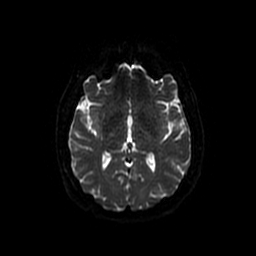
[im 65/104]
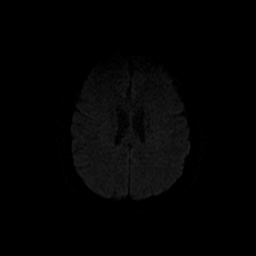
[im 78/104]
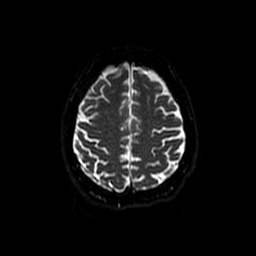
[im 91/104]
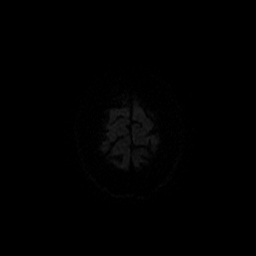
[im 104/104]
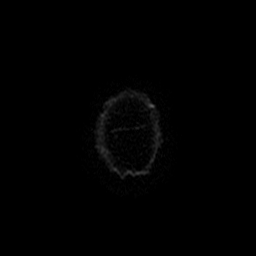

[Series 4: DWI · coronal · 5.0mm · 1.09mm/px · 7 of 74 slices shown (2 of 4)]
[im 1/74]
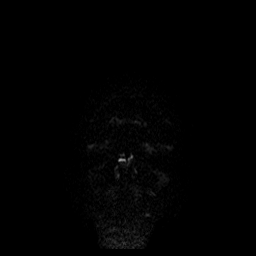
[im 13/74]
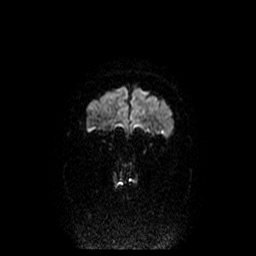
[im 25/74]
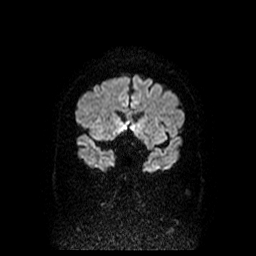
[im 37/74]
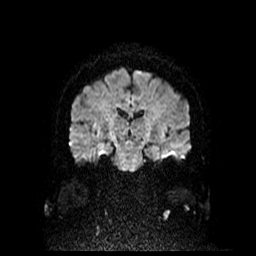
[im 49/74]
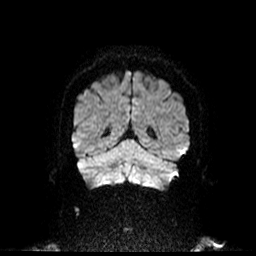
[im 61/74]
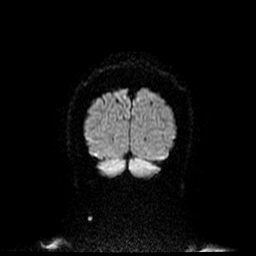
[im 74/74]
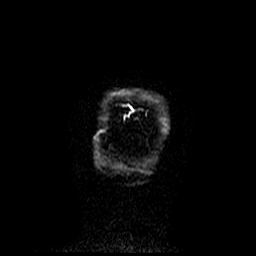

[Series 5: T1 · sagittal · 5.0mm · 0.47mm/px · 2 of 25 slices shown (1 of 2)]
[im 1/25]
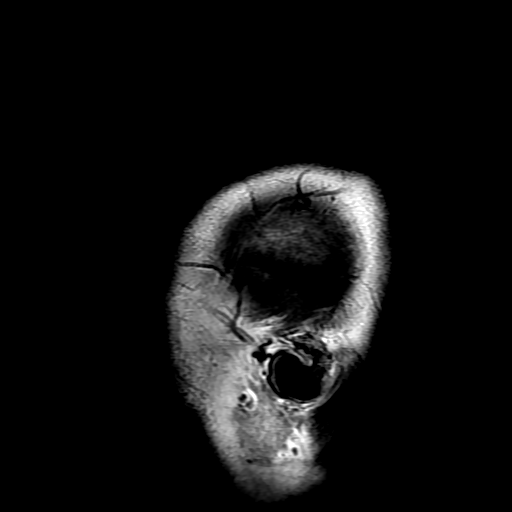
[im 25/25]
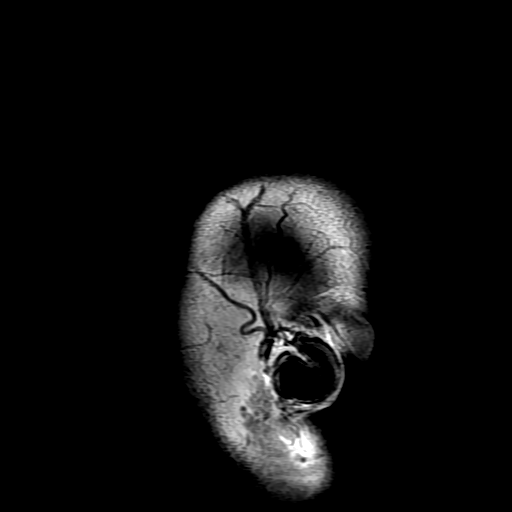

[Series 6: T2 · axial · 5.0mm · 0.43mm/px · z∈[-88,+60]mm · 3 of 26 slices shown (1 of 2)]
[im 1/26]
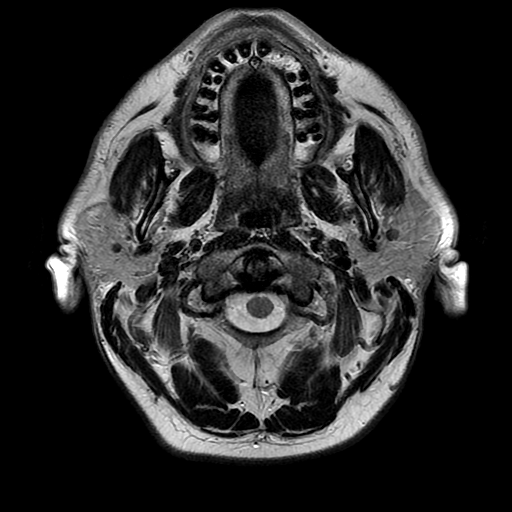
[im 13/26]
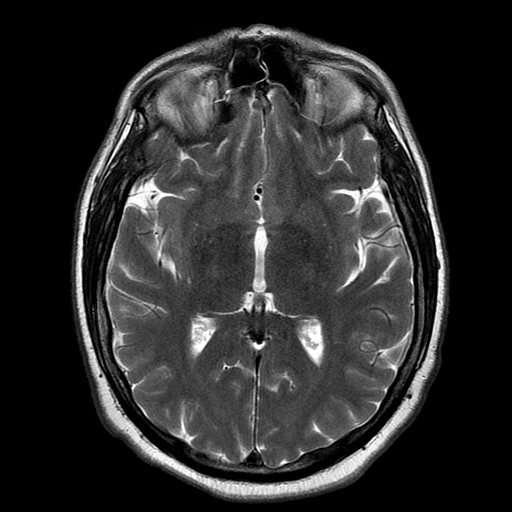
[im 26/26]
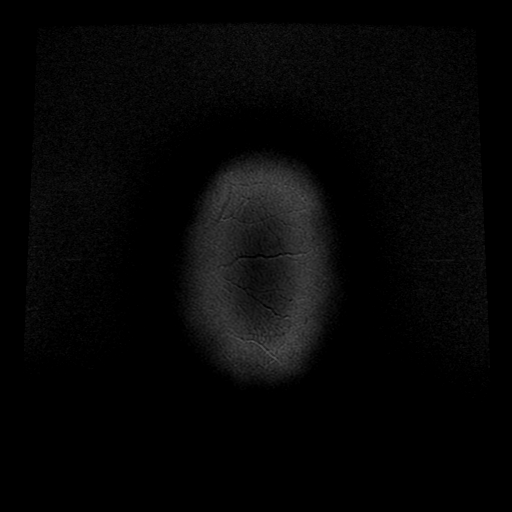

[Series 7: FLAIR · axial · 3.0mm · 0.43mm/px · z∈[-88,+60]mm · 3 of 26 slices shown]
[im 1/26]
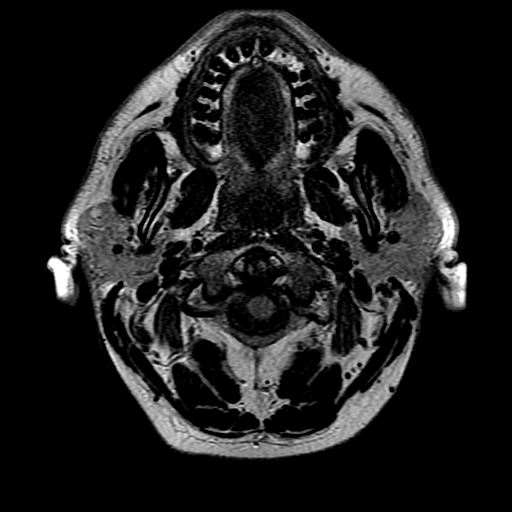
[im 13/26]
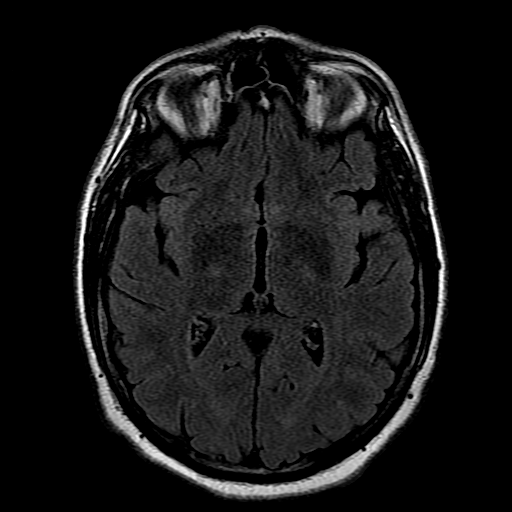
[im 26/26]
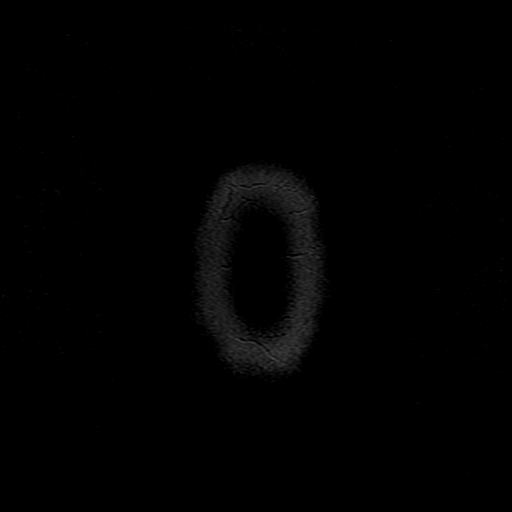

[Series 9: T1 · axial · 3.0mm · 0.47mm/px · 1 of 104 slices shown (2 of 2)]
[im 1/104]
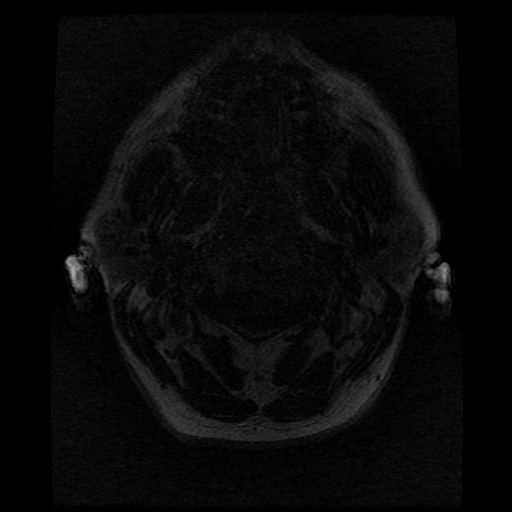

[Series 10: T2 · coronal · 5.0mm · 0.39mm/px · 3 of 28 slices shown (2 of 2)]
[im 1/28]
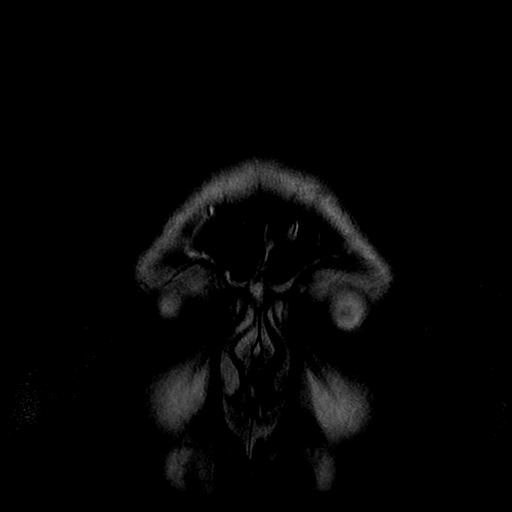
[im 14/28]
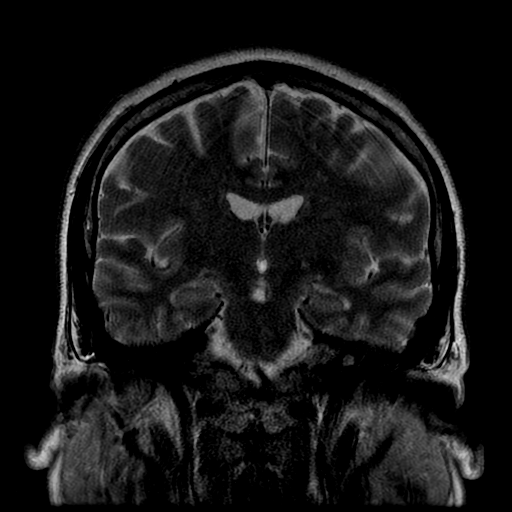
[im 28/28]
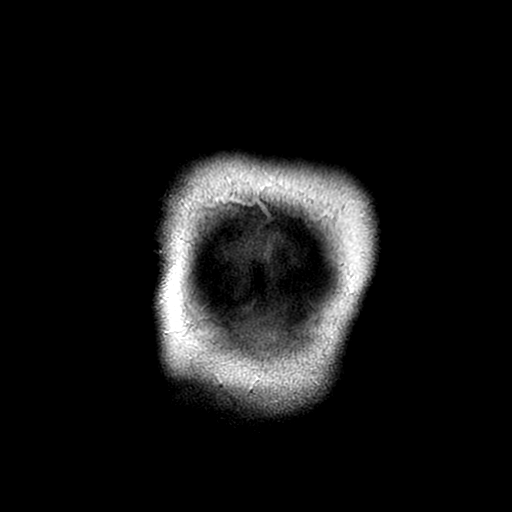

[Series 300: DWI · axial · 3.0mm · 1.09mm/px · z∈[-94,+56]mm · 5 of 52 slices shown (3 of 4)]
[im 1/52]
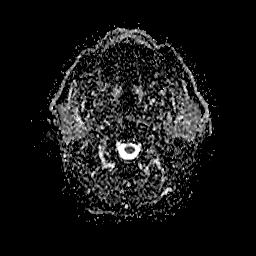
[im 13/52]
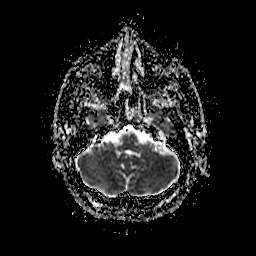
[im 26/52]
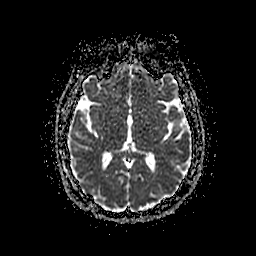
[im 39/52]
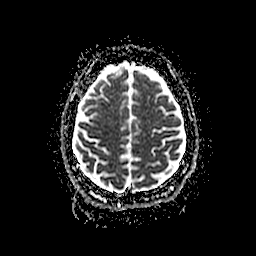
[im 52/52]
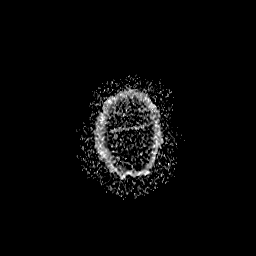

[Series 400: DWI · coronal · 5.0mm · 1.09mm/px · 4 of 37 slices shown (4 of 4)]
[im 1/37]
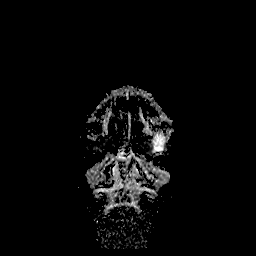
[im 13/37]
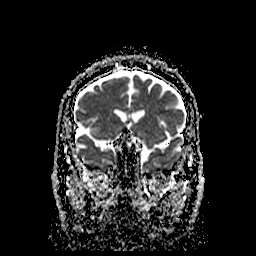
[im 25/37]
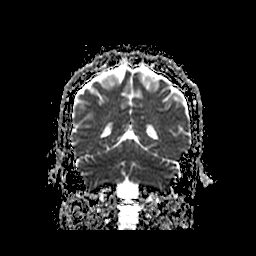
[im 37/37]
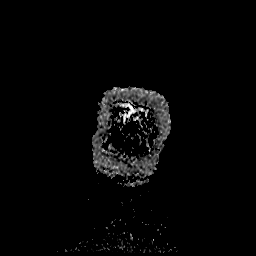

[37 of 48 positions shown; findings below may reference images not displayed]

FINDINGS: Brain: There is no acute infarction or intracranial hemorrhage.
There is no intracranial mass, mass effect, or edema. There is no
hydrocephalus or extra-axial fluid collection. Ventricles and sulci
are normal in size and configuration.

Vascular: Major vessel flow voids at the skull base are preserved.

Skull and upper cervical spine: Normal marrow signal is preserved.

Sinuses/Orbits: Minor mucosal thickening.  Orbits are unremarkable.

Other: Sella is partially empty but not expanded. Mastoid air cells
are clear.
IMPRESSION: No acute infarction, hemorrhage, or mass.

## 2021-07-01 MED ORDER — METHOCARBAMOL 500 MG PO TABS
500.0000 mg | ORAL_TABLET | Freq: Once | ORAL | Status: AC
  Administered 2021-07-01: 01:00:00 500 mg via ORAL
  Filled 2021-07-01: qty 1

## 2021-07-01 MED ORDER — CYCLOBENZAPRINE HCL 10 MG PO TABS: 10.0000 mg | ORAL_TABLET | Freq: Two times a day (BID) | ORAL | 0 refills | Status: DC | PRN

## 2021-07-01 MED ORDER — KETOROLAC TROMETHAMINE 30 MG/ML IJ SOLN
30.0000 mg | Freq: Once | INTRAMUSCULAR | Status: AC
  Administered 2021-07-01: 10:00:00 30 mg via INTRAMUSCULAR
  Filled 2021-07-01: qty 1

## 2021-07-01 MED ORDER — IBUPROFEN 800 MG PO TABS: 800.0000 mg | ORAL_TABLET | Freq: Three times a day (TID) | ORAL | 0 refills | Status: DC

## 2021-07-01 MED ORDER — OXYCODONE HCL 5 MG PO TABS
10.0000 mg | ORAL_TABLET | Freq: Once | ORAL | Status: AC
  Administered 2021-07-01: 01:00:00 10 mg via ORAL
  Filled 2021-07-01: qty 2

## 2021-07-01 NOTE — ED Provider Notes (Signed)
Emergency Medicine Provider Triage Evaluation Note  Shawn Mills , a 40 y.o. male  was evaluated in triage.  Pt complains of low and mid back pain.  Pt reports hx of torn muscle in his back (moving christmas decorations) and kidney stones.  Pt was seen and treated for his back pain on Friday. Pt reports he stood up from sitting around 10pm and had acute mid back pain.  He reports he had sudden numbness in the R leg.  Denies saddle anesthesia, loss of bowel or bladder control.  Pt reports pain is a 10/10.  No treatment of pain PTA. Denies IVDU, back procedures, fever/chills, no previous back surgeries.  Review of Systems  Positive: Mid and low back pain, leg numbness Negative: Loss of bowel/bladder control  Physical Exam  BP 120/80    Pulse (!) 108    Temp 98.2 F (36.8 C) (Oral)    Resp (!) 22    Ht 6' (1.829 m)    Wt 115.7 kg    SpO2 95%    BMI 34.58 kg/m  Gen:   Awake, no distress   Resp:  Normal effort  MSK:   Moves extremities without difficulty  Other:  Midline and L paraspinal tenderness of the T and L spine.  Strength 5/5 in thr LLE, 4/5 in the RLE.  Reported loss of sensation in the entire right leg.  Medical Decision Making  Medically screening exam initiated at 12:23 AM.  Appropriate orders placed.  Shawn Mills was informed that the remainder of the evaluation will be completed by another provider, this initial triage assessment does not replace that evaluation, and the importance of remaining in the ED until their evaluation is complete.  Mid and low back pain; loss of sensation in the right leg with weakness.  MRI ordered.     Shawn Mills, Boyd Kerbs 07/01/21 Wendelyn Breslow, MD 07/01/21 [phone]

## 2021-07-01 NOTE — Discharge Instructions (Addendum)
You were evaluated in the Emergency Department and after careful evaluation, we did not find any emergent condition requiring admission or further testing in the hospital.  Your exam/testing today was overall reassuring.  The MRI imaging of your back and brain did not reveal an etiology of the weakness in your right leg.  You do have some degenerative disc disease in your lower back which would be causing symptoms potentially on the left but your symptoms are all on the right.  You may have an isolated peripheral neuropathy.  Recommend neurology and PCP follow-up outpatient.  Your back pain could be due to your degenerative disc disease versus a muscle strain.  Recommend Tylenol, ibuprofen, Flexeril for pain.  You do have kidney stones in your kidneys but they should not be causing symptoms at this time.  No evidence of kidney stones in your ureter.  Your urinalysis did not reveal evidence of urinary tract infection.  Please return to the Emergency Department if you experience any worsening of your condition.  Thank you for allowing [place] to be a part of your care.

## 2021-07-01 NOTE — ED Notes (Signed)
X2 no response °

## 2021-07-01 NOTE — ED Triage Notes (Signed)
Pt c/o back pain. Pt states he was seen by a provider last week and was told he had kidney stones and a torn muscle in his back.

## 2021-07-01 NOTE — ED Notes (Signed)
Patient transported to MRI 

## 2021-07-01 NOTE — ED Notes (Signed)
Patient in MRI at this time. 

## 2021-07-01 NOTE — ED Notes (Signed)
X1 for vitals update with no response 

## 2021-07-01 NOTE — ED Provider Notes (Signed)
[facility] EMERGENCY DEPARTMENT Provider Note   CSN: [id number] Arrival date & time: 06/30/21  2337     History Chief Complaint  Patient presents with   Back Pain   Chest Pain    Shawn Mills is a 40 y.o. male.   Back Pain Associated symptoms: numbness and weakness   Chest Pain Associated symptoms: back pain, numbness and weakness    40 year old male with a history of kidney stones presenting to the Emergency Department with back pain and right leg weakness.  The patient states that he was seen in [place] for this and had a CT scan of his back that showed no evidence of fracture.  He was told he had a likely torn muscle in his back.  He states that a few days ago he was moving Christmas decorations when he thinks he injured his back.  He does have a history of kidney stones and endorses flank pain consistent with prior episodes of nephrolithiasis.  Around 10 PM last night his symptoms came on more acutely with mid back pain.  He also endorses sudden numbness in his right leg.  He denies any loss of bowel or bladder function, saddle anesthesia.  He denies any recent falls other than his recent injury lifting Christmas decorations.  He not take any medicine for the pain.  He still feels like his right leg is weak.  He denies any history of IV drug use, denies any fever or chills.  He denies any history of back surgery.  Additionally, the patient does state that he has not been able to urinate for the last 2 days and he thinks that this is due to a kidney stone.  Past Medical History:  Diagnosis Date   Carpal tunnel syndrome    Kidney stones     There are no problems to display for this patient.   Past Surgical History:  Procedure Laterality Date   LITHOTRIPSY         History reviewed. No pertinent family history.  Social History   Tobacco Use   Smoking status: Every Day  Substance Use Topics   Alcohol use: Yes    Comment: daiily   Drug use: No     Home Medications Prior to Admission medications   Medication Sig Start Date End Date Taking? Authorizing Provider  cyclobenzaprine (FLEXERIL) 10 MG tablet Take 1 tablet (10 mg total) by mouth 2 (two) times daily as needed for muscle spasms. 07/01/21  Yes Regan Lemming, MD  ibuprofen (ADVIL) 800 MG tablet Take 1 tablet (800 mg total) by mouth 3 (three) times daily. 07/01/21  Yes Regan Lemming, MD    Allergies    Patient has no known allergies.  Review of Systems   Review of Systems  Genitourinary:  Positive for difficulty urinating.  Musculoskeletal:  Positive for back pain.  Neurological:  Positive for weakness and numbness.  All other systems reviewed and are negative.  Physical Exam Updated Vital Signs BP 123/88    Pulse 86    Temp 97.9 F (36.6 C) (Oral)    Resp (!) 21    Ht 6' (1.829 m)    Wt 115.7 kg    SpO2 97%    BMI 34.58 kg/m   Physical Exam Vitals and nursing note reviewed.  Constitutional:      General: He is not in acute distress.    Appearance: He is well-developed.  HENT:     Head: Normocephalic and atraumatic.  Eyes:  Conjunctiva/sclera: Conjunctivae normal.  Cardiovascular:     Rate and Rhythm: Normal rate and regular rhythm.     Heart sounds: No murmur heard. Pulmonary:     Effort: Pulmonary effort is normal. No respiratory distress.     Breath sounds: Normal breath sounds.  Abdominal:     Palpations: Abdomen is soft.     Tenderness: There is no abdominal tenderness.  Musculoskeletal:        General: No swelling.     Cervical back: Neck supple.     Comments: Mild musculoskeletal tenderness to palpation in the back bilaterally, no midline tenderness.    Skin:    General: Skin is warm and dry.     Capillary Refill: Capillary refill takes less than 2 seconds.  Neurological:     Mental Status: He is alert.     Comments: 4-5 strength in the right lower extremity, 5 out of 5 strength all other extremities, intact sensation to light touch all 4  extremities.  Psychiatric:        Mood and Affect: Mood normal.    ED Results / Procedures / Treatments   Labs (all labs ordered are listed, but only abnormal results are displayed) Labs Reviewed  URINALYSIS, ROUTINE W REFLEX MICROSCOPIC - Abnormal; Notable for the following components:      Result Value   Specific Gravity, Urine >1.030 (*)    Bilirubin Urine SMALL (*)    Protein, ur 30 (*)    All other components within normal limits  URINALYSIS, MICROSCOPIC (REFLEX) - Abnormal; Notable for the following components:   Bacteria, UA RARE (*)    All other components within normal limits    EKG EKG Interpretation  Date/Time:  Thursday June 30 2021 23:54:51 EST Ventricular Rate:  111 PR Interval:  128 QRS Duration: 86 QT Interval:  354 QTC Calculation: 481 R Axis:   95 Text Interpretation: Sinus tachycardia Rightward axis Borderline ECG Confirmed by Regan Lemming ([id number]) on 07/01/2021 8:38:02 AM  Radiology MR BRAIN WO CONTRAST  Result Date: 07/01/2021 CLINICAL DATA:  Neuro deficit, acute, stroke suspected EXAM: MRI HEAD WITHOUT CONTRAST TECHNIQUE: Multiplanar, multiecho pulse sequences of the brain and surrounding structures were obtained without intravenous contrast. COMPARISON:  None. FINDINGS: Brain: There is no acute infarction or intracranial hemorrhage. There is no intracranial mass, mass effect, or edema. There is no hydrocephalus or extra-axial fluid collection. Ventricles and sulci are normal in size and configuration. Vascular: Major vessel flow voids at the skull base are preserved. Skull and upper cervical spine: Normal marrow signal is preserved. Sinuses/Orbits: Minor mucosal thickening.  Orbits are unremarkable. Other: Sella is partially empty but not expanded. Mastoid air cells are clear. IMPRESSION: No acute infarction, hemorrhage, or mass. Electronically Signed   By: Macy Mis M.D.   On: 07/01/2021 11:37   MR THORACIC SPINE WO CONTRAST  Result Date:  07/01/2021 CLINICAL DATA:  40 year old male with acute back pain and sudden onset numbness in the right leg. EXAM: MRI THORACIC SPINE WITHOUT CONTRAST TECHNIQUE: Multiplanar, multisequence MR imaging of the thoracic spine was performed. No intravenous contrast was administered. COMPARISON:  Lumbar MRI reported separately. FINDINGS: Limited cervical spine imaging: Cervicothoracic junction alignment is within normal limits. Thoracic spine segmentation: Normal, concordant with lumbar numbering today. Alignment: Normal thoracic kyphosis. No scoliosis or spondylolisthesis. Vertebrae: T4 benign vertebral body hemangioma. Visualized bone marrow signal is within normal limits. No marrow edema or evidence of acute osseous abnormality. Cord: Normal. Conus medullaris is at T12-L1. No spinal  cord signal abnormality. Paraspinal and other soft tissues: Negative visible chest and upper abdominal viscera. Thoracic paraspinal soft tissues appear normal. Disc levels: T1-T2: Negative. T2-T3: Negative. T3-T4: Mild right facet hypertrophy.  No stenosis. T4-T5: Negative. T5-T6: Negative. T6-T7: Negative. T7-T8: Mild ligament flavum hypertrophy mostly on the right. No stenosis. T8-T9: Negative. T9-T10: Negative. T10-T11: Negative. T11-T12: Negative. T12-L1: Negative. IMPRESSION: Essentially normal MRI appearance of the thoracic spine. No disc herniation, spinal stenosis, or neural impingement. Electronically Signed   By: Odessa Fleming M.D.   On: 07/01/2021 05:09   MR LUMBAR SPINE WO CONTRAST  Result Date: 07/01/2021 CLINICAL DATA:  40 year old male with acute back pain and sudden onset numbness in the right leg. EXAM: MRI LUMBAR SPINE WITHOUT CONTRAST TECHNIQUE: Multiplanar, multisequence MR imaging of the lumbar spine was performed. No intravenous contrast was administered. COMPARISON:  Thoracic spine MRI the same day. FINDINGS: Segmentation: Normal, concordant with the thoracic spine numbering today. Alignment: Mild straightening of  lumbar lordosis. No spondylolisthesis. Vertebrae: Small area of chronic degenerative endplate marrow signal change anteriorly superiorly at L4, versus a small chronic hemangioma there. Furthermore, a benign hemangioma of the right L4 facet is suspected on series 22, image 5. And a circumscribed 9 mm dark T1 and T2 signal area in the visible medial right iliac bone (series 25, image 31) is most likely a benign fibrous lesion of bone. No convincing marrow edema or acute osseous abnormality. Background bone marrow signal within normal limits. Intact visible sacrum and SI joints. Conus medullaris and cauda equina: Conus extends to the T12-L1 level. No lower spinal cord or conus signal abnormality. Normal cauda equina nerve roots. Paraspinal and other soft tissues: There is faint left lateral paraspinal soft tissue inflammation adjacent to the L3-L4 disc (series 22, images 14 and 15), see additional details of that level below. Other paraspinal soft tissues appear normal. Negative visible abdominal viscera. The urinary bladder is mildly distended. Disc levels: L1-L2:  Negative. L2-L3:  Negative. L3-L4: Left foraminal to far lateral disc herniation which appears relatively small (series 25, image 17), but is associated with left L3 neural foraminal edema and/or trace epidural blood. Mild inflammation along the exiting left L3 nerve. No spinal or lateral recess involvement. Normal contralateral right L3 foramen. L4-L5: Mild disc desiccation and circumferential disc bulge. Subtle left foraminal annular fissure of the disc. Mild endplate spurring. Mild facet hypertrophy. No significant spinal or lateral recess stenosis. Mild left L4 neural foraminal stenosis. L5-S1: Subtle midline annular fissure of the disc (series 21, image 8). Otherwise negative. IMPRESSION: 1. Small L3-L4 left foraminal to far lateral disc herniation with associated edema and/or trace epidural blood in the left L3 neural foramen. Mild regional soft  tissue inflammation. Query Left L3 radiculitis. 2. Otherwise mild disc and posterior element degeneration at L4-L5. Minimal disc degeneration at L5-S1. Mild left L4 neural foraminal no lumbar spinal stenosis. Electronically Signed   By: Odessa Fleming M.D.   On: 07/01/2021 05:16   CT Renal Stone Study  Result Date: 07/01/2021 CLINICAL DATA:  Nephrolithiasis, back pain EXAM: CT ABDOMEN AND PELVIS WITHOUT CONTRAST TECHNIQUE: Multidetector CT imaging of the abdomen and pelvis was performed following the standard protocol without IV contrast. COMPARISON:  None. FINDINGS: Lower chest: No acute abnormality. Hepatobiliary: No focal liver abnormality is seen. No gallstones, gallbladder wall thickening, or biliary dilatation. Pancreas: Unremarkable. No pancreatic ductal dilatation or surrounding inflammatory changes. Spleen: Normal in size without focal abnormality. Adrenals/Urinary Tract: Normal adrenal glands. 3 mm nonobstructing right renal calculus.  3 mm nonobstructing left renal calculus. No obstructive uropathy. No ureteral calculi. No renal mass. Normal bladder. Stomach/Bowel: Stomach is within normal limits. Appendix appears normal. No evidence of bowel wall thickening, distention, or inflammatory changes. Vascular/Lymphatic: No significant vascular findings are present. No enlarged abdominal or pelvic lymph nodes. Reproductive: Prostate is unremarkable. Other: No abdominal wall hernia or abnormality. No abdominopelvic ascites. Musculoskeletal: No acute or significant osseous findings. 7 mm right iliac sclerotic bone lesion consistent with a bone island. 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 Recommend a follow-up MRI of the pelvis in 6 months. IMPRESSION: 1. Bilateral nonobstructing nephrolithiasis. No obstructive uropathy. No ureteral or bladder calculi. 2. 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 Recommend a follow-up MRI of the pelvis in 6 months. Electronically Signed   By: Kathreen Devoid M.D.   On: 07/01/2021 08:54    Procedures Procedures   Medications Ordered in ED Medications  oxyCODONE (Oxy IR/ROXICODONE) immediate release tablet 10 mg (10 mg Oral Given 07/01/21 0042)  methocarbamol (ROBAXIN) tablet 500 mg (500 mg Oral Given 07/01/21 0042)  ketorolac (TORADOL) 30 MG/ML injection 30 mg (30 mg Intramuscular Given 07/01/21 [id number])    ED Course  I have reviewed the triage vital signs and the nursing notes.  Pertinent labs & imaging results that were available during my care of the patient were reviewed by me and considered in my medical decision making (see chart for details).    MDM Rules/Calculators/A&P                          41 year old male with a history of kidney stones presenting to the Emergency Department with back pain and right leg weakness.  The patient states that he was seen in [place] for this and had a CT scan of his back that showed no evidence of fracture.  He was told he had a likely torn muscle in his back.  He states that a few days ago he was moving Christmas decorations when he thinks he injured his back.  He does have a history of kidney stones and endorses flank pain consistent with prior episodes of nephrolithiasis.  Around 10 PM last night his symptoms came on more acutely with mid back pain.  He also endorses sudden numbness in his right leg.  He denies any loss of bowel or bladder function, saddle anesthesia.  He denies any recent falls other than his recent injury lifting Christmas decorations.  He not take any medicine for the pain.  He still feels like his right leg is weak.  He denies any history of IV drug use, denies any fever or chills.  He denies any history of back surgery.   Additionally, the patient does state that he has not been able to urinate for the last 2 days and he thinks that this is due to a kidney stone.  On arrival, the patient was afebrile, hemodynamically stable, with a neuro exams consistent with right lower extremity weakness, no decreased sensation.  No red flag symptoms of cauda equina syndrome at this time.  Concern for possible herniated disc resulting in radiculopathy and new weakness.  MRI imaging of the thoracic and lumbar spine was performed and results are below: IMPRESSION: Essentially normal  MRI appearance of the thoracic spine. No disc herniation, spinal stenosis, or neural impingement.   IMPRESSION: 1. Small L3-L4 left foraminal to far lateral disc herniation with associated edema and/or trace epidural blood in the left L3 neural foramen. Mild regional soft tissue inflammation. Query Left L3 radiculitis.   2. Otherwise mild disc and posterior element degeneration at L4-L5. Minimal disc degeneration at L5-S1. Mild left L4 neural foraminal no lumbar spinal stenosis.  These findings do not explain the patient's right leg weakness as it appears that the disc herniation is affecting the left side of the patient's spine.  No evidence of cauda equina syndrome, no evidence of conus medullaris syndrome, no evidence of epidural abscess.  No other acute abnormalities noted.  Do the patient's concern for possible nephrolithiasis, CT abdomen pelvis was performed which revealed bilateral nephrolithiasis with no evidence of ureterolithiasis, ureteric obstruction.    CT Renal Stone IMPRESSION: 1. Bilateral nonobstructing nephrolithiasis. No obstructive uropathy. No ureteral or bladder calculi. 2. 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 Recommend a follow-up MRI of the pelvis in 6 months.     Bladder scan unremarkable. Patient able to urinate. Urinalysis revealed no hematuria or evidence of UTI.  Patient was administered Toradol with some improvement in his pain and symptoms. Informed of his workup to thus far. Spoke with on call neurosurgery regarding his back pain symptoms who did not recommend any acute intervention at this time. MRI imaging of the brain was obtained and did not reveal evidence of acute stroke. Patient ambulatory in the ED with improvement in symptoms.   The patient has been appropriately medically screened and/or stabilized in the ED. I have low suspicion for any other emergent medical condition which would require further screening, evaluation or treatment in the ED or require inpatient management.  Will plan for Motrin, Flexeril for acute back pain, likely a combination of muscle strain and degenerative disc disease. No etiology for the patient's RLE weakness at this time, with reassuring MRI imaging findings. Symptoms could be consistent with a peripheral neuropathy. Will provide outpatient neurology referral for follow-up as needed. Patient would also benefit from PCP follow-up to ensure symptom resulution and to follow-up incidental finding of right posterior ilium bone lesion seen incidentally on CT.   Final Clinical Impression(s) / ED Diagnoses Final diagnoses:  Idiopathic peripheral neuropathy  Weakness  Degenerative disc disease, lumbar  Nephrolithiasis    Rx / DC Orders ED Discharge Orders          Ordered    ibuprofen (ADVIL) 800 MG tablet  3 times daily        07/01/21 1212    cyclobenzaprine (FLEXERIL) 10 MG tablet  2 times daily PRN        07/01/21 1212    Ambulatory referral to Neurology       Comments: An appointment is requested in approximately: 2 weeks   07/01/21 1215             Regan Lemming, MD 07/01/21 [phone]

## 2021-07-01 NOTE — Progress Notes (Signed)
I was called to look at the MRI on this 40 year old gentleman who presents to the emergency department with an episode of numbness down the right leg and "diffuse right leg weakness" with significant back pain.  I reviewed his notes.  I reviewed the MRI of the thoracic spine and it looks essentially normal.  The radiologist read this as normal.  I reviewed his MRI of his lumbar spine and there is a focal left extraforaminal disc protrusion at L3-4 and may be a small annular tear in the extraforaminal space at L4-5 on the left, but I see no right-sided pathology that would make his leg generally weak and numb.  I see no compressive lesion on the right.  I do not see evidence that would suggest AV fistula or AVM.  There is no mass lesion and on noncontrasted imaging no evidence of infectious appearing material. I would look for other etiologies such as neuropathies or inflammatory or infectious or vascular or metabolic or other etiologies, but I see no compressive lesion that needs neurosurgical intervention at this time.  Please do not hesitate to contact me if I can be of further assistance in this matter

## 2022-12-08 ENCOUNTER — Emergency Department (HOSPITAL_COMMUNITY): Payer: Medicaid Other

## 2022-12-08 ENCOUNTER — Inpatient Hospital Stay (HOSPITAL_COMMUNITY): Payer: Medicaid Other

## 2022-12-08 ENCOUNTER — Inpatient Hospital Stay (HOSPITAL_COMMUNITY)
Admission: EM | Admit: 2022-12-08 | Discharge: 2023-01-04 | DRG: 082 | Disposition: A | Discharge status: Rehabilitation Facility | Payer: Medicaid Other | Attending: General Surgery | Admitting: General Surgery

## 2022-12-08 DIAGNOSIS — R561 Post traumatic seizures: Secondary | ICD-10-CM | POA: Diagnosis not present

## 2022-12-08 DIAGNOSIS — E669 Obesity, unspecified: Secondary | ICD-10-CM | POA: Diagnosis present

## 2022-12-08 DIAGNOSIS — S069X9S Unspecified intracranial injury with loss of consciousness of unspecified duration, sequela: Secondary | ICD-10-CM | POA: Diagnosis not present

## 2022-12-08 DIAGNOSIS — R0689 Other abnormalities of breathing: Principal | ICD-10-CM

## 2022-12-08 DIAGNOSIS — R29810 Facial weakness: Secondary | ICD-10-CM | POA: Diagnosis not present

## 2022-12-08 DIAGNOSIS — R509 Fever, unspecified: Secondary | ICD-10-CM | POA: Diagnosis not present

## 2022-12-08 DIAGNOSIS — R4701 Aphasia: Secondary | ICD-10-CM | POA: Diagnosis not present

## 2022-12-08 DIAGNOSIS — R7401 Elevation of levels of liver transaminase levels: Secondary | ICD-10-CM | POA: Diagnosis present

## 2022-12-08 DIAGNOSIS — R402332 Coma scale, best motor response, abnormal, at arrival to emergency department: Secondary | ICD-10-CM | POA: Diagnosis present

## 2022-12-08 DIAGNOSIS — F10120 Alcohol abuse with intoxication, uncomplicated: Secondary | ICD-10-CM | POA: Diagnosis present

## 2022-12-08 DIAGNOSIS — L298 Other pruritus: Secondary | ICD-10-CM | POA: Diagnosis not present

## 2022-12-08 DIAGNOSIS — S066X0D Traumatic subarachnoid hemorrhage without loss of consciousness, subsequent encounter: Secondary | ICD-10-CM | POA: Diagnosis not present

## 2022-12-08 DIAGNOSIS — S0591XA Unspecified injury of right eye and orbit, initial encounter: Secondary | ICD-10-CM | POA: Diagnosis not present

## 2022-12-08 DIAGNOSIS — B963 Hemophilus influenzae [H. influenzae] as the cause of diseases classified elsewhere: Secondary | ICD-10-CM | POA: Diagnosis present

## 2022-12-08 DIAGNOSIS — S066X0A Traumatic subarachnoid hemorrhage without loss of consciousness, initial encounter: Secondary | ICD-10-CM | POA: Diagnosis not present

## 2022-12-08 DIAGNOSIS — H9191 Unspecified hearing loss, right ear: Secondary | ICD-10-CM | POA: Diagnosis not present

## 2022-12-08 DIAGNOSIS — Z79899 Other long term (current) drug therapy: Secondary | ICD-10-CM

## 2022-12-08 DIAGNOSIS — W01198A Fall on same level from slipping, tripping and stumbling with subsequent striking against other object, initial encounter: Secondary | ICD-10-CM | POA: Diagnosis present

## 2022-12-08 DIAGNOSIS — R402222 Coma scale, best verbal response, incomprehensible words, at arrival to emergency department: Secondary | ICD-10-CM | POA: Diagnosis present

## 2022-12-08 DIAGNOSIS — I10 Essential (primary) hypertension: Secondary | ICD-10-CM | POA: Diagnosis present

## 2022-12-08 DIAGNOSIS — M25561 Pain in right knee: Secondary | ICD-10-CM | POA: Diagnosis not present

## 2022-12-08 DIAGNOSIS — I7771 Dissection of carotid artery: Secondary | ICD-10-CM | POA: Diagnosis not present

## 2022-12-08 DIAGNOSIS — S020XXA Fracture of vault of skull, initial encounter for closed fracture: Secondary | ICD-10-CM

## 2022-12-08 DIAGNOSIS — K5901 Slow transit constipation: Secondary | ICD-10-CM | POA: Diagnosis not present

## 2022-12-08 DIAGNOSIS — E871 Hypo-osmolality and hyponatremia: Secondary | ICD-10-CM | POA: Diagnosis present

## 2022-12-08 DIAGNOSIS — J9601 Acute respiratory failure with hypoxia: Secondary | ICD-10-CM | POA: Diagnosis present

## 2022-12-08 DIAGNOSIS — S069X3S Unspecified intracranial injury with loss of consciousness of 1 hour to 5 hours 59 minutes, sequela: Secondary | ICD-10-CM | POA: Diagnosis not present

## 2022-12-08 DIAGNOSIS — S069XAS Unspecified intracranial injury with loss of consciousness status unknown, sequela: Secondary | ICD-10-CM | POA: Diagnosis not present

## 2022-12-08 DIAGNOSIS — F10129 Alcohol abuse with intoxication, unspecified: Secondary | ICD-10-CM | POA: Diagnosis not present

## 2022-12-08 DIAGNOSIS — R159 Full incontinence of feces: Secondary | ICD-10-CM | POA: Diagnosis present

## 2022-12-08 DIAGNOSIS — S069XAA Unspecified intracranial injury with loss of consciousness status unknown, initial encounter: Secondary | ICD-10-CM

## 2022-12-08 DIAGNOSIS — S065XAD Traumatic subdural hemorrhage with loss of consciousness status unknown, subsequent encounter: Secondary | ICD-10-CM | POA: Diagnosis not present

## 2022-12-08 DIAGNOSIS — R451 Restlessness and agitation: Secondary | ICD-10-CM | POA: Diagnosis not present

## 2022-12-08 DIAGNOSIS — S069X1S Unspecified intracranial injury with loss of consciousness of 30 minutes or less, sequela: Secondary | ICD-10-CM | POA: Diagnosis not present

## 2022-12-08 DIAGNOSIS — Z6830 Body mass index (BMI) 30.0-30.9, adult: Secondary | ICD-10-CM

## 2022-12-08 DIAGNOSIS — G9389 Other specified disorders of brain: Secondary | ICD-10-CM | POA: Diagnosis present

## 2022-12-08 DIAGNOSIS — Z781 Physical restraint status: Secondary | ICD-10-CM | POA: Diagnosis not present

## 2022-12-08 DIAGNOSIS — S0219XD Other fracture of base of skull, subsequent encounter for fracture with routine healing: Secondary | ICD-10-CM | POA: Diagnosis not present

## 2022-12-08 DIAGNOSIS — R000 Tachycardia, unspecified: Secondary | ICD-10-CM | POA: Diagnosis present

## 2022-12-08 DIAGNOSIS — Y908 Blood alcohol level of 240 mg/100 ml or more: Secondary | ICD-10-CM | POA: Diagnosis present

## 2022-12-08 DIAGNOSIS — Z87442 Personal history of urinary calculi: Secondary | ICD-10-CM

## 2022-12-08 DIAGNOSIS — J690 Pneumonitis due to inhalation of food and vomit: Secondary | ICD-10-CM | POA: Diagnosis present

## 2022-12-08 DIAGNOSIS — I82C11 Acute embolism and thrombosis of right internal jugular vein: Secondary | ICD-10-CM | POA: Diagnosis present

## 2022-12-08 DIAGNOSIS — M79604 Pain in right leg: Secondary | ICD-10-CM | POA: Diagnosis not present

## 2022-12-08 DIAGNOSIS — S0219XA Other fracture of base of skull, initial encounter for closed fracture: Secondary | ICD-10-CM

## 2022-12-08 DIAGNOSIS — R5383 Other fatigue: Secondary | ICD-10-CM | POA: Diagnosis not present

## 2022-12-08 DIAGNOSIS — Z7982 Long term (current) use of aspirin: Secondary | ICD-10-CM | POA: Diagnosis not present

## 2022-12-08 DIAGNOSIS — S066XAA Traumatic subarachnoid hemorrhage with loss of consciousness status unknown, initial encounter: Secondary | ICD-10-CM | POA: Diagnosis present

## 2022-12-08 DIAGNOSIS — I609 Nontraumatic subarachnoid hemorrhage, unspecified: Secondary | ICD-10-CM | POA: Diagnosis not present

## 2022-12-08 DIAGNOSIS — R519 Headache, unspecified: Secondary | ICD-10-CM | POA: Diagnosis not present

## 2022-12-08 DIAGNOSIS — S065X0A Traumatic subdural hemorrhage without loss of consciousness, initial encounter: Secondary | ICD-10-CM | POA: Diagnosis not present

## 2022-12-08 DIAGNOSIS — R402112 Coma scale, eyes open, never, at arrival to emergency department: Secondary | ICD-10-CM | POA: Diagnosis present

## 2022-12-08 DIAGNOSIS — R32 Unspecified urinary incontinence: Secondary | ICD-10-CM | POA: Diagnosis present

## 2022-12-08 DIAGNOSIS — M549 Dorsalgia, unspecified: Secondary | ICD-10-CM | POA: Diagnosis not present

## 2022-12-08 DIAGNOSIS — R41844 Frontal lobe and executive function deficit: Secondary | ICD-10-CM | POA: Diagnosis not present

## 2022-12-08 DIAGNOSIS — S0292XD Unspecified fracture of facial bones, subsequent encounter for fracture with routine healing: Secondary | ICD-10-CM | POA: Diagnosis not present

## 2022-12-08 DIAGNOSIS — G8929 Other chronic pain: Secondary | ICD-10-CM | POA: Diagnosis not present

## 2022-12-08 DIAGNOSIS — R1310 Dysphagia, unspecified: Secondary | ICD-10-CM | POA: Diagnosis not present

## 2022-12-08 DIAGNOSIS — S066XAD Traumatic subarachnoid hemorrhage with loss of consciousness status unknown, subsequent encounter: Secondary | ICD-10-CM | POA: Diagnosis not present

## 2022-12-08 DIAGNOSIS — S15091D Other specified injury of right carotid artery, subsequent encounter: Secondary | ICD-10-CM | POA: Diagnosis not present

## 2022-12-08 DIAGNOSIS — W19XXXD Unspecified fall, subsequent encounter: Secondary | ICD-10-CM | POA: Diagnosis not present

## 2022-12-08 DIAGNOSIS — K5900 Constipation, unspecified: Secondary | ICD-10-CM | POA: Diagnosis not present

## 2022-12-08 DIAGNOSIS — S069XAD Unspecified intracranial injury with loss of consciousness status unknown, subsequent encounter: Secondary | ICD-10-CM | POA: Diagnosis not present

## 2022-12-08 DIAGNOSIS — W1789XD Other fall from one level to another, subsequent encounter: Secondary | ICD-10-CM | POA: Diagnosis not present

## 2022-12-08 DIAGNOSIS — F10920 Alcohol use, unspecified with intoxication, uncomplicated: Secondary | ICD-10-CM

## 2022-12-08 DIAGNOSIS — Z7141 Alcohol abuse counseling and surveillance of alcoholic: Secondary | ICD-10-CM | POA: Diagnosis not present

## 2022-12-08 DIAGNOSIS — S065XAA Traumatic subdural hemorrhage with loss of consciousness status unknown, initial encounter: Secondary | ICD-10-CM | POA: Diagnosis not present

## 2022-12-08 DIAGNOSIS — S065XAS Traumatic subdural hemorrhage with loss of consciousness status unknown, sequela: Secondary | ICD-10-CM | POA: Diagnosis not present

## 2022-12-08 DIAGNOSIS — S020XXD Fracture of vault of skull, subsequent encounter for fracture with routine healing: Secondary | ICD-10-CM | POA: Diagnosis not present

## 2022-12-08 DIAGNOSIS — R569 Unspecified convulsions: Secondary | ICD-10-CM | POA: Diagnosis not present

## 2022-12-08 DIAGNOSIS — M79661 Pain in right lower leg: Secondary | ICD-10-CM | POA: Diagnosis not present

## 2022-12-08 DIAGNOSIS — H9221 Otorrhagia, right ear: Secondary | ICD-10-CM | POA: Diagnosis present

## 2022-12-08 DIAGNOSIS — F1010 Alcohol abuse, uncomplicated: Secondary | ICD-10-CM | POA: Diagnosis not present

## 2022-12-08 DIAGNOSIS — Z87891 Personal history of nicotine dependence: Secondary | ICD-10-CM | POA: Diagnosis not present

## 2022-12-08 DIAGNOSIS — G629 Polyneuropathy, unspecified: Secondary | ICD-10-CM | POA: Diagnosis not present

## 2022-12-08 DIAGNOSIS — G479 Sleep disorder, unspecified: Secondary | ICD-10-CM | POA: Diagnosis not present

## 2022-12-08 DIAGNOSIS — S065X9A Traumatic subdural hemorrhage with loss of consciousness of unspecified duration, initial encounter: Secondary | ICD-10-CM | POA: Diagnosis not present

## 2022-12-08 DIAGNOSIS — R4587 Impulsiveness: Secondary | ICD-10-CM | POA: Diagnosis not present

## 2022-12-08 LAB — I-STAT ARTERIAL BLOOD GAS, ED
Acid-base deficit: 5 mmol/L — ABNORMAL HIGH (ref 0.0–2.0)
Bicarbonate: 22.4 mmol/L (ref 20.0–28.0)
Calcium, Ion: 1.09 mmol/L — ABNORMAL LOW (ref 1.15–1.40)
HCT: 42 % (ref 39.0–52.0)
Hemoglobin: 14.3 g/dL (ref 13.0–17.0)
O2 Saturation: 100 %
Patient temperature: 98.6
Potassium: 4 mmol/L (ref 3.5–5.1)
Sodium: 134 mmol/L — ABNORMAL LOW (ref 135–145)
TCO2: 24 mmol/L (ref 22–32)
pCO2 arterial: 50.3 mmHg — ABNORMAL HIGH (ref 32–48)
pH, Arterial: 7.257 — ABNORMAL LOW (ref 7.35–7.45)
pO2, Arterial: 554 mmHg — ABNORMAL HIGH (ref 83–108)

## 2022-12-08 LAB — I-STAT CHEM 8, ED
BUN: 5 mg/dL — ABNORMAL LOW (ref 6–20)
Calcium, Ion: 1.01 mmol/L — ABNORMAL LOW (ref 1.15–1.40)
Chloride: 100 mmol/L (ref 98–111)
Creatinine, Ser: 1 mg/dL (ref 0.61–1.24)
Glucose, Bld: 117 mg/dL — ABNORMAL HIGH (ref 70–99)
HCT: 48 % (ref 39.0–52.0)
Hemoglobin: 16.3 g/dL (ref 13.0–17.0)
Potassium: 3.7 mmol/L (ref 3.5–5.1)
Sodium: 135 mmol/L (ref 135–145)
TCO2: 20 mmol/L — ABNORMAL LOW (ref 22–32)

## 2022-12-08 LAB — PROTIME-INR
INR: 1.1 (ref 0.8–1.2)
Prothrombin Time: 14 seconds (ref 11.4–15.2)

## 2022-12-08 LAB — COMPREHENSIVE METABOLIC PANEL
ALT: 55 U/L — ABNORMAL HIGH (ref 0–44)
AST: 52 U/L — ABNORMAL HIGH (ref 15–41)
Albumin: 3.7 g/dL (ref 3.5–5.0)
Alkaline Phosphatase: 97 U/L (ref 38–126)
Anion gap: 12 (ref 5–15)
BUN: 6 mg/dL (ref 6–20)
CO2: 19 mmol/L — ABNORMAL LOW (ref 22–32)
Calcium: 8.3 mg/dL — ABNORMAL LOW (ref 8.9–10.3)
Chloride: 101 mmol/L (ref 98–111)
Creatinine, Ser: 0.84 mg/dL (ref 0.61–1.24)
GFR, Estimated: >60 mL/min (ref >60)
Glucose, Bld: 115 mg/dL — ABNORMAL HIGH (ref 70–99)
Potassium: 3.7 mmol/L (ref 3.5–5.1)
Sodium: 132 mmol/L — ABNORMAL LOW (ref 135–145)
Total Bilirubin: 0.4 mg/dL (ref 0.3–1.2)
Total Protein: 6.8 g/dL (ref 6.5–8.1)

## 2022-12-08 LAB — CBC
HCT: 43.3 % (ref 39.0–52.0)
Hemoglobin: 15.1 g/dL (ref 13.0–17.0)
MCH: 34.7 pg — ABNORMAL HIGH (ref 26.0–34.0)
MCHC: 34.9 g/dL (ref 30.0–36.0)
MCV: 99.5 fL (ref 80.0–100.0)
Platelets: 435 10*3/uL — ABNORMAL HIGH (ref 150–400)
RBC: 4.35 MIL/uL (ref 4.22–5.81)
RDW: 13.2 % (ref 11.5–15.5)
WBC: 14.1 10*3/uL — ABNORMAL HIGH (ref 4.0–10.5)
nRBC: 0 % (ref 0.0–0.2)

## 2022-12-08 LAB — TYPE AND SCREEN
ABO/RH(D): A POS
Antibody Screen: NEGATIVE

## 2022-12-08 LAB — ABO/RH: ABO/RH(D): A POS

## 2022-12-08 LAB — URINALYSIS, ROUTINE W REFLEX MICROSCOPIC
Bilirubin Urine: NEGATIVE
Glucose, UA: NEGATIVE mg/dL
Hgb urine dipstick: NEGATIVE
Ketones, ur: NEGATIVE mg/dL
Leukocytes,Ua: NEGATIVE
Nitrite: NEGATIVE
Protein, ur: NEGATIVE mg/dL
Specific Gravity, Urine: >1.046 — ABNORMAL HIGH (ref 1.005–1.030)
pH: 5 (ref 5.0–8.0)

## 2022-12-08 LAB — LACTIC ACID, PLASMA: Lactic Acid, Venous: 2.1 mmol/L (ref 0.5–1.9)

## 2022-12-08 LAB — ETHANOL: Alcohol, Ethyl (B): 270 mg/dL — ABNORMAL HIGH (ref <10)

## 2022-12-08 MED ORDER — POLYETHYLENE GLYCOL 3350 17 G PO PACK
17.0000 g | PACK | Freq: Every day | ORAL | Status: DC
  Administered 2022-12-09 – 2022-12-20 (×7): 17 g
  Filled 2022-12-08 (×9): qty 1

## 2022-12-08 MED ORDER — SUCCINYLCHOLINE CHLORIDE 20 MG/ML IJ SOLN
INTRAMUSCULAR | Status: AC | PRN
  Administered 2022-12-08: 100 mg via INTRAVENOUS

## 2022-12-08 MED ORDER — LEVETIRACETAM IN NACL 500 MG/100ML IV SOLN: 500.0000 mg | Freq: Two times a day (BID) | INTRAVENOUS | Status: DC

## 2022-12-08 MED ORDER — FENTANYL BOLUS VIA INFUSION
50.0000 ug | INTRAVENOUS | Status: DC | PRN
  Administered 2022-12-09: 100 ug via INTRAVENOUS
  Administered 2022-12-11: 50 ug via INTRAVENOUS
  Administered 2022-12-13 – 2022-12-16 (×3): 100 ug via INTRAVENOUS
  Administered 2022-12-16: 50 ug via INTRAVENOUS
  Administered 2022-12-17 (×2): 100 ug via INTRAVENOUS
  Administered 2022-12-17 (×4): 50 ug via INTRAVENOUS
  Administered 2022-12-17: 100 ug via INTRAVENOUS
  Administered 2022-12-18: 50 ug via INTRAVENOUS
  Administered 2022-12-18: 100 ug via INTRAVENOUS

## 2022-12-08 MED ORDER — ETOMIDATE 2 MG/ML IV SOLN
INTRAVENOUS | Status: AC | PRN
  Administered 2022-12-08: 20 mg via INTRAVENOUS

## 2022-12-08 MED ORDER — ONDANSETRON 4 MG PO TBDP: 4.0000 mg | ORAL_TABLET | Freq: Four times a day (QID) | ORAL | Status: DC | PRN

## 2022-12-08 MED ORDER — SODIUM CHLORIDE 0.9 % IV BOLUS
1000.0000 mL | Freq: Once | INTRAVENOUS | Status: AC
  Administered 2022-12-08: 1000 mL via INTRAVENOUS

## 2022-12-08 MED ORDER — LORAZEPAM 1 MG PO TABS: 1.0000 mg | ORAL_TABLET | ORAL | Status: DC | PRN

## 2022-12-08 MED ORDER — PROPOFOL 1000 MG/100ML IV EMUL
0.0000 ug/kg/min | INTRAVENOUS | Status: DC
  Administered 2022-12-08: 20 ug/kg/min via INTRAVENOUS
  Administered 2022-12-09: 40 ug/kg/min via INTRAVENOUS
  Administered 2022-12-09: 20 ug/kg/min via INTRAVENOUS
  Administered 2022-12-09: 10 ug/kg/min via INTRAVENOUS
  Administered 2022-12-10: 20 ug/kg/min via INTRAVENOUS
  Administered 2022-12-10: 12 ug/kg/min via INTRAVENOUS
  Administered 2022-12-11 – 2022-12-13 (×4): 20 ug/kg/min via INTRAVENOUS
  Administered 2022-12-13: 25 ug/kg/min via INTRAVENOUS
  Administered 2022-12-13 (×2): 20 ug/kg/min via INTRAVENOUS
  Administered 2022-12-14: 25 ug/kg/min via INTRAVENOUS
  Administered 2022-12-14: 35 ug/kg/min via INTRAVENOUS
  Administered 2022-12-14 – 2022-12-17 (×9): 25 ug/kg/min via INTRAVENOUS
  Administered 2022-12-18: 35 ug/kg/min via INTRAVENOUS
  Administered 2022-12-18: 30 ug/kg/min via INTRAVENOUS
  Filled 2022-12-08 (×31): qty 100

## 2022-12-08 MED ORDER — LEVETIRACETAM IN NACL 1000 MG/100ML IV SOLN
2000.0000 mg | Freq: Once | INTRAVENOUS | Status: AC
  Administered 2022-12-08: 2000 mg via INTRAVENOUS

## 2022-12-08 MED ORDER — LEVETIRACETAM IN NACL 500 MG/100ML IV SOLN
500.0000 mg | Freq: Two times a day (BID) | INTRAVENOUS | Status: AC
  Administered 2022-12-09 – 2022-12-15 (×14): 500 mg via INTRAVENOUS
  Filled 2022-12-08 (×14): qty 100

## 2022-12-08 MED ORDER — ONDANSETRON HCL 4 MG/2ML IJ SOLN
4.0000 mg | Freq: Four times a day (QID) | INTRAMUSCULAR | Status: DC | PRN
  Administered 2022-12-28: 4 mg via INTRAVENOUS
  Filled 2022-12-08: qty 2

## 2022-12-08 MED ORDER — METOPROLOL TARTRATE 5 MG/5ML IV SOLN
5.0000 mg | Freq: Four times a day (QID) | INTRAVENOUS | Status: DC | PRN
  Filled 2022-12-08: qty 5

## 2022-12-08 MED ORDER — PROPOFOL 1000 MG/100ML IV EMUL
INTRAVENOUS | Status: AC
  Administered 2022-12-08: 20 ug/kg/min via INTRAVENOUS
  Filled 2022-12-08: qty 100

## 2022-12-08 MED ORDER — THIAMINE HCL 100 MG/ML IJ SOLN
100.0000 mg | Freq: Every day | INTRAMUSCULAR | Status: DC
  Administered 2022-12-09 – 2022-12-10 (×2): 100 mg via INTRAVENOUS
  Filled 2022-12-08 (×2): qty 2

## 2022-12-08 MED ORDER — ACETAMINOPHEN 160 MG/5ML PO SOLN
1000.0000 mg | Freq: Four times a day (QID) | ORAL | Status: DC
  Administered 2022-12-09 – 2022-12-26 (×61): 1000 mg
  Filled 2022-12-08 (×64): qty 40.6

## 2022-12-08 MED ORDER — SODIUM CHLORIDE 0.9 % IV SOLN
INTRAVENOUS | Status: DC
  Administered 2022-12-10: 100 mL/h via INTRAVENOUS

## 2022-12-08 MED ORDER — FENTANYL CITRATE PF 50 MCG/ML IJ SOSY
50.0000 ug | PREFILLED_SYRINGE | Freq: Once | INTRAMUSCULAR | Status: DC
  Filled 2022-12-08: qty 1

## 2022-12-08 MED ORDER — FENTANYL BOLUS VIA INFUSION
50.0000 ug | INTRAVENOUS | Status: DC | PRN
  Administered 2022-12-08 (×2): 100 ug via INTRAVENOUS

## 2022-12-08 MED ORDER — ASPIRIN 81 MG PO CHEW
81.0000 mg | CHEWABLE_TABLET | Freq: Every day | ORAL | Status: DC
  Administered 2022-12-09 – 2022-12-31 (×24): 81 mg
  Filled 2022-12-08 (×24): qty 1

## 2022-12-08 MED ORDER — HYDRALAZINE HCL 20 MG/ML IJ SOLN: 10.0000 mg | INTRAMUSCULAR | Status: DC | PRN

## 2022-12-08 MED ORDER — IOHEXOL 350 MG/ML SOLN
75.0000 mL | Freq: Once | INTRAVENOUS | Status: AC | PRN
  Administered 2022-12-08: 75 mL via INTRAVENOUS

## 2022-12-08 MED ORDER — PROPOFOL 1000 MG/100ML IV EMUL: 0.0000 ug/kg/min | INTRAVENOUS | Status: DC

## 2022-12-08 MED ORDER — FENTANYL CITRATE PF 50 MCG/ML IJ SOSY: 50.0000 ug | PREFILLED_SYRINGE | Freq: Once | INTRAMUSCULAR | Status: AC

## 2022-12-08 MED ORDER — IOHEXOL 350 MG/ML SOLN
100.0000 mL | Freq: Once | INTRAVENOUS | Status: AC | PRN
  Administered 2022-12-08: 100 mL via INTRAVENOUS

## 2022-12-08 MED ORDER — FENTANYL 2500MCG IN NS 250ML (10MCG/ML) PREMIX INFUSION
50.0000 ug/h | INTRAVENOUS | Status: DC
  Administered 2022-12-08: 200 ug/h via INTRAVENOUS
  Administered 2022-12-08: 50 ug/h via INTRAVENOUS
  Filled 2022-12-08: qty 250

## 2022-12-08 MED ORDER — THIAMINE MONONITRATE 100 MG PO TABS: 100.0000 mg | ORAL_TABLET | Freq: Every day | ORAL | Status: DC

## 2022-12-08 MED ORDER — FENTANYL CITRATE PF 50 MCG/ML IJ SOSY
PREFILLED_SYRINGE | INTRAMUSCULAR | Status: AC
  Administered 2022-12-08: 50 ug via INTRAVENOUS
  Filled 2022-12-08: qty 1

## 2022-12-08 MED ORDER — FENTANYL 2500MCG IN NS 250ML (10MCG/ML) PREMIX INFUSION
50.0000 ug/h | INTRAVENOUS | Status: DC
  Administered 2022-12-08: 200 ug/h via INTRAVENOUS
  Administered 2022-12-09: 100 ug/h via INTRAVENOUS
  Administered 2022-12-10 – 2022-12-11 (×2): 50 ug/h via INTRAVENOUS
  Administered 2022-12-13: 75 ug/h via INTRAVENOUS
  Administered 2022-12-14: 50 ug/h via INTRAVENOUS
  Administered 2022-12-15 (×2): 100 ug/h via INTRAVENOUS
  Administered 2022-12-17: 75 ug/h via INTRAVENOUS
  Administered 2022-12-18: 50 ug/h via INTRAVENOUS
  Filled 2022-12-08 (×9): qty 250

## 2022-12-08 MED ORDER — ACETAMINOPHEN 500 MG PO TABS: 1000.0000 mg | ORAL_TABLET | Freq: Four times a day (QID) | ORAL | Status: DC

## 2022-12-08 MED ORDER — FOLIC ACID 1 MG PO TABS
1.0000 mg | ORAL_TABLET | Freq: Every day | ORAL | Status: DC
  Administered 2022-12-09 – 2022-12-10 (×2): 1 mg via ORAL
  Filled 2022-12-08 (×2): qty 1

## 2022-12-08 MED ORDER — DOCUSATE SODIUM 50 MG/5ML PO LIQD
100.0000 mg | Freq: Two times a day (BID) | ORAL | Status: DC
  Administered 2022-12-09 – 2022-12-20 (×19): 100 mg
  Filled 2022-12-08 (×21): qty 10

## 2022-12-08 MED ORDER — ADULT MULTIVITAMIN W/MINERALS CH
1.0000 | ORAL_TABLET | Freq: Every day | ORAL | Status: DC
  Administered 2022-12-09 – 2022-12-10 (×2): 1 via ORAL
  Filled 2022-12-08 (×2): qty 1

## 2022-12-08 NOTE — ED Provider Notes (Signed)
[facility] AT [facility] Provider Note   CSN: [id number] Arrival date & time: 12/08/22  1952     History  Chief Complaint  Patient presents with   Shawn Mills    Shawn Mills is a 42 y.o. male.  Pt is a 42 yo male with pmhx significant for kidney stones.  Pt fell from a standing position and hit the back of his head.  He had loc at the scene.  He did hit the back of his head and has blood coming out of right ear.  No other injuries noted.  He has been grunting and not following commands since he fell.  Pt is unable to give any hx.       Home Medications Prior to Admission medications   Not on File      Allergies    Patient has no allergy information on record.    Review of Systems   Review of Systems  Unable to perform ROS: Mental status change    Physical Exam Updated Vital Signs BP 116/74   Pulse 74   Temp (!) 96.8 F (36 C)   Resp (!) 22   Ht 5\' 10"  (1.778 m)   Wt 100 kg   SpO2 100%   BMI 31.63 kg/m  Physical Exam Vitals and nursing note reviewed.  Constitutional:      General: He is in acute distress.     Appearance: He is obese.  HENT:     Head: Normocephalic.     Comments: Hematoma posterior head.  Blood coming from right ear.    Left Ear: External ear normal.     Nose: Nose normal.     Mouth/Throat:     Comments: Blood in mouth.  Poor dentition. Eyes:     Pupils: Pupils are equal, round, and reactive to light.     Comments: Eyes gazing to the left  Neck:     Comments: In c-collar Cardiovascular:     Rate and Rhythm: Normal rate and regular rhythm.     Pulses: Normal pulses.     Heart sounds: Normal heart sounds.  Pulmonary:     Effort: Pulmonary effort is normal.     Breath sounds: Normal breath sounds.  Abdominal:     General: Abdomen is flat. Bowel sounds are normal.     Palpations: Abdomen is soft.  Musculoskeletal:        General: Normal range of motion.  Skin:    General: Skin is warm.     Capillary  Refill: Capillary refill takes less than 2 seconds.  Neurological:     GCS: GCS eye subscore is 1. GCS verbal subscore is 2. GCS motor subscore is 3.     Comments: Pt is moving his left arm, but not right arm  Psychiatric:     Comments: Unable to assess     ED Results / Procedures / Treatments   Labs (all labs ordered are listed, but only abnormal results are displayed) Labs Reviewed  COMPREHENSIVE METABOLIC PANEL - Abnormal; Notable for the following components:      Result Value   Sodium 132 (*)    CO2 19 (*)    Glucose, Bld 115 (*)    Calcium 8.3 (*)    AST 52 (*)    ALT 55 (*)    All other components within normal limits  CBC - Abnormal; Notable for the following components:   WBC 14.1 (*)    MCH 34.7 (*)  Platelets 435 (*)    All other components within normal limits  ETHANOL - Abnormal; Notable for the following components:   Alcohol, Ethyl (B) 270 (*)    All other components within normal limits  URINALYSIS, ROUTINE W REFLEX MICROSCOPIC - Abnormal; Notable for the following components:   Color, Urine STRAW (*)    Specific Gravity, Urine >1.046 (*)    All other components within normal limits  LACTIC ACID, PLASMA - Abnormal; Notable for the following components:   Lactic Acid, Venous 2.1 (*)    All other components within normal limits  I-STAT CHEM 8, ED - Abnormal; Notable for the following components:   BUN 5 (*)    Glucose, Bld 117 (*)    Calcium, Ion 1.01 (*)    TCO2 20 (*)    All other components within normal limits  PROTIME-INR  TRIGLYCERIDES  TYPE AND SCREEN  ABO/RH    EKG None  Radiology CT HEAD WO CONTRAST  Result Date: 12/08/2022 CLINICAL DATA:  Trauma EXAM: CT HEAD WITHOUT CONTRAST CT CERVICAL SPINE WITHOUT CONTRAST TECHNIQUE: Multidetector CT imaging of the head and cervical spine was performed following the standard protocol without intravenous contrast. Multiplanar CT image reconstructions of the cervical spine were also generated.  RADIATION DOSE REDUCTION: This exam was performed according to the departmental dose-optimization program which includes automated exposure control, adjustment of the mA and/or kV according to patient size and/or use of iterative reconstruction technique. COMPARISON:  None Available. FINDINGS: CT HEAD FINDINGS Brain: Subdural and subarachnoid hemorrhage along the left frontal lobe and temporal lobe (series 3, image 20 and 11). The subdural component measures up to 2 mm, without significant mass effect or midline shift. No definite parenchymal or intraventricular hemorrhage. Pneumocephalus in the right middle cranial fossa (series 3, image 15 Vascular: No hyperdense vessel. Air is noted in the right carotid canal (series 4, image 16) and jugular foramen (series 4, image 12). Skull: Nondisplaced right parietal fracture (series 4, image 40), which extends inferiorly to the right lambdoid suture, with diastasis of the right lambdoid suture. This diastasis extends into the right occipitomastoid suture (series 4, image 10). Fracture that likely extends to the right temporal bone, with air noted in the right jugular foramen and carotid canal (series 4, images 12 and 16). Fractures extending through the bilateral sphenoid sinuses (series 7, image 50 and 57) and into the bilateral carotid canals. Fracture also extends superiorly through the clivus into the posterior fossa and through the floor of the sphenoid sinuses into the nasopharynx (9, image 165 and series 7, image 66). Vertically oriented fracture through the right mastoid (series 8, image 205 and series 7, image 48), which extends into middle ear, adjacent to the ossicles, with likely hemorrhage around the ossicles and in the middle ear. This likely also extends into the middle cranial fossa. Sinuses/Orbits: Fluid in the bilateral sphenoid sinuses, likely hemorrhage. Additional fluid in ethmoid air cells and inferior frontal sinuses. No acute finding in the orbits.  Other: The mastoid air cells are well aerated. Air is noted in the parapharyngeal fat bilaterally (series 3, image 3). CT CERVICAL SPINE FINDINGS Alignment: Straightening of the normal cervical lordosis. Skull base and vertebrae: No acute fracture or suspicious osseous lesion. Soft tissues and spinal canal: No prevertebral fluid or swelling. No visible canal hematoma. Disc levels: Degenerative changes in the cervical spine. No significant spinal canal stenosis. Upper chest: For findings in the thorax, please see same day CT chest. IMPRESSION: 1. Subdural and  subarachnoid hemorrhage along the left frontal lobe and temporal lobe, without significant mass effect or midline shift. 2. Nondisplaced right parietal fracture, which extends inferiorly to the right lambdoid suture, with diastasis of the right lambdoid suture. This extends into the right occipitomastoid suture and likely extends to the right temporal bone, with air noted in the right jugular foramen and carotid canal. 3. Fractures extending through the bilateral sphenoid sinuses and into the bilateral carotid canals. Fracture also extends superiorly through the clivus into the posterior fossa and through the floor of the sphenoid sinuses into the nasopharynx. 4. Vertically oriented fracture through the right mastoid, which extends into the middle ear, adjacent to the ossicles, with likely hemorrhage around the ossicles and in the middle ear. This likely also extends into the middle cranial fossa. 5. Air is noted in the parapharyngeal fat bilaterally. 6. No acute fracture or static subluxation in the cervical spine. 7. For findings in the thorax, please see same day CT chest. #1, #2, and #6 were discussed by telephone on 12/08/2022 at 8:40 pm with provider WILSON. #3 and #4 were communicated on 12/08/2022 at 9:17 pm to provider [facility] via secure text paging. Electronically Signed   By: Wiliam Ke M.D.   On: 12/08/2022 21:18   CT CERVICAL SPINE WO  CONTRAST  Result Date: 12/08/2022 CLINICAL DATA:  Trauma EXAM: CT HEAD WITHOUT CONTRAST CT CERVICAL SPINE WITHOUT CONTRAST TECHNIQUE: Multidetector CT imaging of the head and cervical spine was performed following the standard protocol without intravenous contrast. Multiplanar CT image reconstructions of the cervical spine were also generated. RADIATION DOSE REDUCTION: This exam was performed according to the departmental dose-optimization program which includes automated exposure control, adjustment of the mA and/or kV according to patient size and/or use of iterative reconstruction technique. COMPARISON:  None Available. FINDINGS: CT HEAD FINDINGS Brain: Subdural and subarachnoid hemorrhage along the left frontal lobe and temporal lobe (series 3, image 20 and 11). The subdural component measures up to 2 mm, without significant mass effect or midline shift. No definite parenchymal or intraventricular hemorrhage. Pneumocephalus in the right middle cranial fossa (series 3, image 15 Vascular: No hyperdense vessel. Air is noted in the right carotid canal (series 4, image 16) and jugular foramen (series 4, image 12). Skull: Nondisplaced right parietal fracture (series 4, image 40), which extends inferiorly to the right lambdoid suture, with diastasis of the right lambdoid suture. This diastasis extends into the right occipitomastoid suture (series 4, image 10). Fracture that likely extends to the right temporal bone, with air noted in the right jugular foramen and carotid canal (series 4, images 12 and 16). Fractures extending through the bilateral sphenoid sinuses (series 7, image 50 and 57) and into the bilateral carotid canals. Fracture also extends superiorly through the clivus into the posterior fossa and through the floor of the sphenoid sinuses into the nasopharynx (9, image 165 and series 7, image 66). Vertically oriented fracture through the right mastoid (series 8, image 205 and series 7, image 48), which  extends into middle ear, adjacent to the ossicles, with likely hemorrhage around the ossicles and in the middle ear. This likely also extends into the middle cranial fossa. Sinuses/Orbits: Fluid in the bilateral sphenoid sinuses, likely hemorrhage. Additional fluid in ethmoid air cells and inferior frontal sinuses. No acute finding in the orbits. Other: The mastoid air cells are well aerated. Air is noted in the parapharyngeal fat bilaterally (series 3, image 3). CT CERVICAL SPINE FINDINGS Alignment: Straightening of the normal cervical  lordosis. Skull base and vertebrae: No acute fracture or suspicious osseous lesion. Soft tissues and spinal canal: No prevertebral fluid or swelling. No visible canal hematoma. Disc levels: Degenerative changes in the cervical spine. No significant spinal canal stenosis. Upper chest: For findings in the thorax, please see same day CT chest. IMPRESSION: 1. Subdural and subarachnoid hemorrhage along the left frontal lobe and temporal lobe, without significant mass effect or midline shift. 2. Nondisplaced right parietal fracture, which extends inferiorly to the right lambdoid suture, with diastasis of the right lambdoid suture. This extends into the right occipitomastoid suture and likely extends to the right temporal bone, with air noted in the right jugular foramen and carotid canal. 3. Fractures extending through the bilateral sphenoid sinuses and into the bilateral carotid canals. Fracture also extends superiorly through the clivus into the posterior fossa and through the floor of the sphenoid sinuses into the nasopharynx. 4. Vertically oriented fracture through the right mastoid, which extends into the middle ear, adjacent to the ossicles, with likely hemorrhage around the ossicles and in the middle ear. This likely also extends into the middle cranial fossa. 5. Air is noted in the parapharyngeal fat bilaterally. 6. No acute fracture or static subluxation in the cervical spine. 7.  For findings in the thorax, please see same day CT chest. #1, #2, and #6 were discussed by telephone on 12/08/2022 at 8:40 pm with provider WILSON. #3 and #4 were communicated on 12/08/2022 at 9:17 pm to provider [facility] via secure text paging. Electronically Signed   By: Wiliam Ke M.D.   On: 12/08/2022 21:18   CT CHEST ABDOMEN PELVIS W CONTRAST  Result Date: 12/08/2022 CLINICAL DATA:  Polytrauma EXAM: CT CHEST, ABDOMEN, AND PELVIS WITH CONTRAST TECHNIQUE: Multidetector CT imaging of the chest, abdomen and pelvis was performed following the standard protocol during bolus administration of intravenous contrast. RADIATION DOSE REDUCTION: This exam was performed according to the departmental dose-optimization program which includes automated exposure control, adjustment of the mA and/or kV according to patient size and/or use of iterative reconstruction technique. CONTRAST:  OMNIPAQUE IOHEXOL 350 MG/ML SOLN COMPARISON:  CT renal stone 07/01/2021 FINDINGS: CT CHEST FINDINGS Cardiovascular: No significant vascular findings. Normal heart size. No pericardial effusion. Mediastinum/Nodes: No mediastinal hematoma or pneumomediastinum. Enteric tube is seen throughout the esophagus. Visualized thyroid gland is within normal limits. No enlarged lymph nodes are identified. Lungs/Pleura: Endotracheal tube tip 5.5 cm above the carina. There is minimal dependent atelectasis in both lungs. The lungs are otherwise clear. There is no pleural effusion or pneumothorax. Musculoskeletal: No chest wall mass or suspicious bone lesions identified. CT ABDOMEN PELVIS FINDINGS Hepatobiliary: No hepatic injury or perihepatic hematoma. Gallbladder is unremarkable. Pancreas: Unremarkable. No pancreatic ductal dilatation or surrounding inflammatory changes. Spleen: No splenic injury or perisplenic hematoma. Adrenals/Urinary Tract: No adrenal hemorrhage or renal injury identified. Bladder is unremarkable. Stomach/Bowel: Stomach is within  normal limits. Enteric tube tip is in the proximal body of the stomach. Appendix appears normal. No evidence of bowel wall thickening, distention, or inflammatory changes. Vascular/Lymphatic: No significant vascular findings are present. No enlarged abdominal or pelvic lymph nodes. Reproductive: Prostate is unremarkable. Other: No abdominal wall hernia or abnormality. No abdominopelvic ascites. Musculoskeletal: No acute fractures are seen. There are 2 small sclerotic foci within the right iliac bone which are unchanged from 2022. IMPRESSION: 1. No acute posttraumatic sequelae in the chest, abdomen or pelvis. 2. Endotracheal tube tip 5.5 cm above the carina. 3. Enteric tube tip in the proximal body  of the stomach. Electronically Signed   By: Darliss Cheney M.D.   On: 12/08/2022 20:55   DG Chest Port 1 View  Result Date: 12/08/2022 CLINICAL DATA:  Trauma.  Patient fell with head injury. EXAM: PORTABLE CHEST 1 VIEW COMPARISON:  None Available. FINDINGS: Endotracheal tube with tip measuring 4.8 cm above the carina. Enteric tube tip is present. Tip is off the field of view but below the left hemidiaphragm. Proximal side hole projects at the EG junction. Shallow inspiration. Heart size and pulmonary vascularity are normal. Lungs are clear. No pleural effusions. No pneumothorax. Mediastinal contours appear intact. IMPRESSION: 1. Endotracheal tube appears in satisfactory position. Proximal side hole of the enteric tube projects at the EG junction and advancement may be indicated. 2. No evidence of active pulmonary disease. Electronically Signed   By: Burman Nieves M.D.   On: 12/08/2022 20:29   DG Pelvis Portable  Result Date: 12/08/2022 CLINICAL DATA:  Trauma, fall EXAM: PORTABLE PELVIS 1-2 VIEWS COMPARISON:  None Available. FINDINGS: No displaced fracture or dislocation is seen. Proximal left femur is noted in frogleg lateral position limiting evaluation of the neck. Ischial tuberosities are not included in their  entirety. IMPRESSION: No displaced fracture or dislocation is seen in the limited portable view of the pelvis. Electronically Signed   By: Ernie Avena M.D.   On: 12/08/2022 20:28    Procedures Procedures    Medications Ordered in ED Medications  etomidate (AMIDATE) injection (20 mg Intravenous Given 12/08/22 2000)  succinylcholine (ANECTINE) injection (100 mg Intravenous Given 12/08/22 2001)  propofol (DIPRIVAN) 1000 MG/100ML infusion (20 mcg/kg/min  80 kg (Order-Specific) Intravenous New Bag/Given 12/08/22 2004)  fentaNYL in NS (5mcg/ml) infusion-PREMIX (200 mcg/hr Intravenous Infusion Verify 12/08/22 2125)  fentaNYL (SUBLIMAZE) bolus via infusion 50-100 mcg (100 mcg Intravenous Bolus from Bag 12/08/22 2039)  levETIRAcetam (KEPPRA) IVPB 1000 mg/100 mL premix (0 mg Intravenous Stopped 12/08/22 2042)  fentaNYL (SUBLIMAZE) injection 50 mcg (50 mcg Intravenous Given 12/08/22 2044)  iohexol (OMNIPAQUE) 350 MG/ML injection 100 mL (100 mLs Intravenous Contrast Given 12/08/22 2038)  sodium chloride 0.9 % bolus 1,000 mL (1,000 mLs Intravenous New Bag/Given 12/08/22 2046)    ED Course/ Medical Decision Making/ A&P                             Medical Decision Making Amount and/or Complexity of Data Reviewed Labs: ordered. Radiology: ordered.  Risk Prescription drug management. Decision regarding hospitalization.   This patient presents to the ED for concern of fall, this involves an extensive number of treatment options, and is a complaint that carries with it a high risk of complications and morbidity.  The differential diagnosis includes multiple trauma   Co morbidities that complicate the patient evaluation  Kidney stones   Additional history obtained:  Additional history obtained from epic chart review External records from outside source obtained and reviewed including EMS report   Lab Tests:  I Ordered, and personally interpreted labs.  The pertinent  results include:  cbc with wbc elevated at 14.1, lactic 2.1, etoh 270, cmp nl,    Imaging Studies ordered:  I ordered imaging studies including cxr, pelvis, ct head/neck/chest/abd/pelvis  I independently visualized and interpreted imaging which showed  CXR: Endotracheal tube appears in satisfactory position. Proximal side  hole of the enteric tube projects at the EG junction and advancement  may be indicated.  2. No evidence of active pulmonary disease.  Pelvis: No  displaced fracture or dislocation is seen in the limited portable  view of the pelvis.  CT chest/abd/pelvis:  No acute posttraumatic sequelae in the chest, abdomen or pelvis.  2. Endotracheal tube tip 5.5 cm above the carina.  3. Enteric tube tip in the proximal body of the stomach.  CT head/c-spine: 1. Subdural and subarachnoid hemorrhage along the left frontal lobe  and temporal lobe, without significant mass effect or midline shift.  2. Nondisplaced right parietal fracture, which extends inferiorly to  the right lambdoid suture, with diastasis of the right lambdoid  suture. This extends into the right occipitomastoid suture and  likely extends to the right temporal bone, with air noted in the  right jugular foramen and carotid canal.  3. Fractures extending through the bilateral sphenoid sinuses and  into the bilateral carotid canals. Fracture also extends superiorly  through the clivus into the posterior fossa and through the floor of  the sphenoid sinuses into the nasopharynx.  4. Vertically oriented fracture through the right mastoid, which  extends into the middle ear, adjacent to the ossicles, with likely  hemorrhage around the ossicles and in the middle ear. This likely  also extends into the middle cranial fossa.  5. Air is noted in the parapharyngeal fat bilaterally.  6. No acute fracture or static subluxation in the cervical spine.  7. For findings in the thorax, please see same day CT chest.    I agree  with the radiologist interpretation   Cardiac Monitoring:  The patient was maintained on a cardiac monitor.  I personally viewed and interpreted the cardiac monitored which showed an underlying rhythm of: nsr   Medicines ordered and prescription drug management:  I ordered medication including keppra  for possible seizure  Reevaluation of the patient after these medicines showed that the patient improved I have reviewed the patients home medicines and have made adjustments as needed   Test Considered:  ct   Critical Interventions:  intubation   Consultations Obtained:  I requested consultation with the trauma surgeon (Dr. Andrey Campanile),  and discussed lab and imaging findings as well as pertinent plan - he will admit.  He consulted Dr. Franky Macho (NS) for his head injury.   Problem List / ED Course:  Airway protection:  due to GCS less than 6, pt intubated by PA [place] under my direct supervision (for airway protection) ? Seizure:  pt had a head injury with gaze deviated to the left.  Keppra given to pt and gaze normalized    Reevaluation:  After the interventions noted above, I reevaluated the patient and found that they have :stayed the same   Social Determinants of Health:  Lives at home   Dispostion:  After consideration of the diagnostic results and the patients response to treatment, I feel that the patent would benefit from admission.    CRITICAL CARE Performed by: Jacalyn Lefevre   Total critical care time: 30 minutes  Critical care time was exclusive of separately billable procedures and treating other patients.  Critical care was necessary to treat or prevent imminent or life-threatening deterioration.  Critical care was time spent personally by me on the following activities: development of treatment plan with patient and/or surrogate as well as nursing, discussions with consultants, evaluation of patient's response to treatment, examination of patient,  obtaining history from patient or surrogate, ordering and performing treatments and interventions, ordering and review of laboratory studies, ordering and review of radiographic studies, pulse oximetry and re-evaluation of patient's condition.  Final Clinical Impression(s) / ED Diagnoses Final diagnoses:  Respiratory depression  Alcoholic intoxication without complication (HCC)  Seizure after head injury (HCC)  Subarachnoid hemorrhage (HCC)  Closed fracture of temporal bone, initial encounter (HCC)  SDH (subdural hematoma) (HCC)  Closed fracture of parietal bone, initial encounter (HCC)  Closed fracture of mastoid bone, initial encounter (HCC)  Closed sphenoid sinus fracture, initial encounter [facility])    Rx / DC Orders ED Discharge Orders     None         Jacalyn Lefevre, MD 12/08/22 2132

## 2022-12-08 NOTE — ED Triage Notes (Signed)
Pt BIB [facility] from home. Per friend on scene pt had had fall from porch. Pt unresponsive, ETOH present. Hematoma to R posterior head, bleeding from R ear.

## 2022-12-08 NOTE — ED Notes (Signed)
Neuro surg at the bedside.
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
## 2022-12-08 NOTE — ED Provider Notes (Signed)
Procedure Name: Intubation Date/Time: 12/08/2022 8:53 PM  Performed by: Gailen Shelter, PAPre-anesthesia Checklist: Patient identified, Patient being monitored, Emergency Drugs available, Timeout performed and Suction available Oxygen Delivery Method: Non-rebreather mask Preoxygenation: Pre-oxygenation with 100% oxygen Induction Type: Rapid sequence Ventilation: Mask ventilation without difficulty Laryngoscope Size: Glidescope Tube size: 7.5 mm Number of attempts: 1 Airway Equipment and Method: Video-laryngoscopy Placement Confirmation: ETT inserted through vocal cords under direct vision, CO2 detector and Breath sounds checked- equal and bilateral Secured at: 22 cm Tube secured with: ETT holder Comments: There was some BRB in ET tube after intubation.  This was suctioned -- no further bleeding        Gailen Shelter, [place] 12/08/22 2054    Jacalyn Lefevre, MD 12/08/22 2219

## 2022-12-08 NOTE — Progress Notes (Signed)
Orthopedic Tech Progress Note Patient Details:  Shawn Mills 06/02/1981 [id number]  Patient ID: Kerby Less, male   DOB: 12-Dec-1980, 42 y.o.   MRN: [id number] Level I; not currently needed. Darleen Crocker 12/08/2022, 8:01 PM

## 2022-12-08 NOTE — ED Notes (Signed)
Patient transported to CT- RN and RT at bedside

## 2022-12-08 NOTE — ED Notes (Signed)
Pt belongings taken home with Melissa, pts girlfriend.

## 2022-12-08 NOTE — ED Notes (Signed)
Family updated on status of pt by Dr.Haviland
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
## 2022-12-08 NOTE — Progress Notes (Signed)
Pt transported to CT and back without incident.  

## 2022-12-08 NOTE — Progress Notes (Signed)
CTA head/neck concerning for possible dissection in distal L petrous ICA & possible b/l cav carotid; R sigmoid sinus and IJ thrombosed  D/w with Dr Franky Macho.  Rec 4v angiogram (can be in AM) and start asa.   Shawn Mills. Andrey Campanile, MD, FACS General, Bariatric, & Minimally Invasive Surgery [facility],  A [facility]
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
## 2022-12-09 ENCOUNTER — Inpatient Hospital Stay (HOSPITAL_COMMUNITY): Payer: Medicaid Other

## 2022-12-09 HISTORY — PX: IR ANGIO INTRA EXTRACRAN SEL COM CAROTID INNOMINATE BILAT MOD SED: IMG5360

## 2022-12-09 HISTORY — PX: IR ANGIO VERTEBRAL SEL VERTEBRAL BILAT MOD SED: IMG5369

## 2022-12-09 LAB — BASIC METABOLIC PANEL
Anion gap: 12 (ref 5–15)
BUN: <5 mg/dL — ABNORMAL LOW (ref 6–20)
CO2: 19 mmol/L — ABNORMAL LOW (ref 22–32)
Calcium: 7.6 mg/dL — ABNORMAL LOW (ref 8.9–10.3)
Chloride: 104 mmol/L (ref 98–111)
Creatinine, Ser: 0.82 mg/dL (ref 0.61–1.24)
GFR, Estimated: >60 mL/min (ref >60)
Glucose, Bld: 94 mg/dL (ref 70–99)
Potassium: 4.2 mmol/L (ref 3.5–5.1)
Sodium: 135 mmol/L (ref 135–145)

## 2022-12-09 LAB — HEPATIC FUNCTION PANEL
ALT: 51 U/L — ABNORMAL HIGH (ref 0–44)
AST: 48 U/L — ABNORMAL HIGH (ref 15–41)
Albumin: 3.3 g/dL — ABNORMAL LOW (ref 3.5–5.0)
Alkaline Phosphatase: 90 U/L (ref 38–126)
Bilirubin, Direct: <0.1 mg/dL (ref 0.0–0.2)
Total Bilirubin: 0.4 mg/dL (ref 0.3–1.2)
Total Protein: 6.2 g/dL — ABNORMAL LOW (ref 6.5–8.1)

## 2022-12-09 LAB — HIV ANTIBODY (ROUTINE TESTING W REFLEX): HIV Screen 4th Generation wRfx: NONREACTIVE

## 2022-12-09 LAB — CBC
HCT: 42.9 % (ref 39.0–52.0)
Hemoglobin: 14.3 g/dL (ref 13.0–17.0)
MCH: 34.7 pg — ABNORMAL HIGH (ref 26.0–34.0)
MCHC: 33.3 g/dL (ref 30.0–36.0)
MCV: 104.1 fL — ABNORMAL HIGH (ref 80.0–100.0)
Platelets: 359 10*3/uL (ref 150–400)
RBC: 4.12 MIL/uL — ABNORMAL LOW (ref 4.22–5.81)
RDW: 13.3 % (ref 11.5–15.5)
WBC: 15.1 10*3/uL — ABNORMAL HIGH (ref 4.0–10.5)
nRBC: 0 % (ref 0.0–0.2)

## 2022-12-09 LAB — TRIGLYCERIDES
Triglycerides: 106 mg/dL (ref <150)
Triglycerides: 117 mg/dL (ref <150)

## 2022-12-09 LAB — MRSA NEXT GEN BY PCR, NASAL: MRSA by PCR Next Gen: NOT DETECTED

## 2022-12-09 MED ORDER — LIDOCAINE HCL 1 % IJ SOLN
INTRAMUSCULAR | Status: AC
  Filled 2022-12-09: qty 20

## 2022-12-09 MED ORDER — CHLORHEXIDINE GLUCONATE CLOTH 2 % EX PADS
6.0000 | MEDICATED_PAD | Freq: Every day | CUTANEOUS | Status: DC
  Administered 2022-12-09 – 2023-01-01 (×25): 6 via TOPICAL

## 2022-12-09 MED ORDER — ORAL CARE MOUTH RINSE
15.0000 mL | OROMUCOSAL | Status: DC
  Administered 2022-12-09 – 2022-12-18 (×108): 15 mL via OROMUCOSAL

## 2022-12-09 MED ORDER — ORAL CARE MOUTH RINSE: 15.0000 mL | OROMUCOSAL | Status: DC | PRN

## 2022-12-09 MED ORDER — SODIUM CHLORIDE 0.9 % IV SOLN: INTRAVENOUS | Status: DC

## 2022-12-09 MED ORDER — IOHEXOL 300 MG/ML  SOLN
150.0000 mL | Freq: Once | INTRAMUSCULAR | Status: AC | PRN
  Administered 2022-12-09: 88 mL via INTRA_ARTERIAL

## 2022-12-09 NOTE — TOC CAGE-AID Note (Signed)
Transition of Care [facility]) - CAGE-AID Screening   Patient Details  Name: Shawn Mills MRN: [id number] Date of Birth: 05/23/1981  [facility] of [facility]) CM/SW Contact:    Leota Sauers, RN Phone Number: 12/09/2022, 6:42 AM   Clinical Narrative:  Patient unable to participate in screening at this time d/t being intubated.  CAGE-AID Screening: Substance Abuse Screening unable to be completed due to: : Patient unable to participate

## 2022-12-09 NOTE — Procedures (Signed)
INR.  Status post four-vessel cerebral arteriogram.  Right CFA approach.  Findings.  1.  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  2.  No angiographic evidence for cortical venous reflux.  3.  No occlusions, intimal flaps, arteriovenous shunting or aneurysms or dissections noted extracranially or intracranially.  Fatima Sanger MD.

## 2022-12-09 NOTE — Progress Notes (Signed)
Follow up - Trauma Critical Care   Patient Details:    Shawn Mills is an 42 y.o. male.  Lines/tubes : Airway (Active)  Secured at (cm) 23 cm 12/09/22 1140  Measured From Lips 12/09/22 1140  Secured Location Center 12/09/22 1140  Secured By [employer] 12/09/22 1140  Tube Holder Repositioned Yes 12/09/22 1140  Prone position No 12/09/22 1140  Cuff Pressure (cm H2O) Green OR 18-26 CmH2O 12/09/22 0841  Site Condition Dry 12/09/22 1140     NG/OG Vented/Dual Lumen 14 Fr. Oral (Active)  Tube Position (Required) External length of tube 12/09/22 0800  Measurement (cm) (Required) 63 cm 12/09/22 0800  Ongoing Placement Verification (Required) (See row information) Yes 12/09/22 0800  Site Assessment Clean, Dry, Intact 12/09/22 0800  Interventions Cleansed 12/09/22 0800  Status Clamped 12/09/22 0800  Drainage Appearance Bloody 12/09/22 0800     Urethral Catheter Ryan Hardy,RN Temperature probe 16 Fr. (Active)  Indication for Insertion or Continuance of Catheter Unstable critically ill patients first 24-48 hours (See Criteria) 12/09/22 0800  Site Assessment Clean, Dry, Intact 12/09/22 0800  Catheter Maintenance Bag below level of bladder;Catheter secured;Insertion date on drainage bag;Drainage bag/tubing not touching floor;No dependent loops;Seal intact 12/09/22 0800  Collection Container Standard drainage bag 12/09/22 0800  Securement Method Securing device (Describe) 12/09/22 0800  Urinary Catheter Interventions (if applicable) Unclamped 12/09/22 0800  Output (mL) 350 mL 12/09/22 0700    Microbiology/Sepsis markers: Results for orders placed or performed during the hospital encounter of 12/08/22  MRSA Next Gen by PCR, Nasal     Status: None   Collection Time: 12/09/22  2:42 AM   Specimen: Nasal Mucosa; Nasal Swab  Result Value Ref Range Status   MRSA by PCR Next Gen NOT DETECTED NOT DETECTED Final    Comment: (NOTE) The GeneXpert MRSA Assay (FDA approved for NASAL  specimens only), is one component of a comprehensive MRSA colonization surveillance program. It is not intended to diagnose MRSA infection nor to guide or monitor treatment for MRSA infections. Test performance is not FDA approved in patients less than 42 years old. Performed at [facility], [address] [place], [place] [address]     Anti-infectives:  Anti-infectives (From admission, onward)    None         Subjective:    Overnight Issues:   Objective:  Vital signs for last 24 hours: Temp:  [96.5 F (35.8 C)-99.7 F (37.6 C)] 99.3 F (37.4 C) (06/01 1100) Pulse Rate:  [71-88] 79 (06/01 1100) Resp:  [13-22] 13 (06/01 1100) BP: (103-129)/(64-91) 128/89 (06/01 1100) SpO2:  [97 %-100 %] 100 % (06/01 1140) FiO2 (%):  [50 %-100 %] 100 % (06/01 1140) Weight:  [96.7 kg-100 kg] 96.7 kg (06/01 0249)  Hemodynamic parameters for last 24 hours:    Intake/Output from previous day: 05/31 0701 - 06/01 0700 In: 2066.4 [I.V.:866.4; IV Piggyback:1200] Out: 2300 [Urine:2300]  Intake/Output this shift: No intake/output data recorded.  Vent settings for last 24 hours: Vent Mode: PRVC FiO2 (%):  [50 %-100 %] 100 % Set Rate:  [14 bmp] 14 bmp Vt Set:  [580 mL] 580 mL PEEP:  [5 cmH20] 5 cmH20 Plateau Pressure:  [13 cmH20-19 cmH20] 17 cmH20  Physical Exam:  Intubated, sedated, unresponsive C-collar in place, Unlabored respirations on the vent Abdomen is soft, nondistended Extremities atraumatic  Results for orders placed or performed during the hospital encounter of 12/08/22 (from the past 24 hour(s))  ABO/Rh     Status: None  Collection Time: 12/08/22  7:55 PM  Result Value Ref Range   ABO/RH(D)      A POS Performed at [facility], [address], [place], [place] [address]   Comprehensive metabolic panel     Status: Abnormal   Collection Time: 12/08/22  7:58 PM  Result Value Ref Range   Sodium 132 (L) 135 - 145 mmol/L   Potassium 3.7 3.5 - 5.1  mmol/L   Chloride 101 98 - 111 mmol/L   CO2 19 (L) 22 - 32 mmol/L   Glucose, Bld 115 (H) 70 - 99 mg/dL   BUN 6 6 - 20 mg/dL   Creatinine, Ser 6.04 0.61 - 1.24 mg/dL   Calcium 8.3 (L) 8.9 - 10.3 mg/dL   Total Protein 6.8 6.5 - 8.1 g/dL   Albumin 3.7 3.5 - 5.0 g/dL   AST 52 (H) 15 - 41 U/L   ALT 55 (H) 0 - 44 U/L   Alkaline Phosphatase 97 38 - 126 U/L   Total Bilirubin 0.4 0.3 - 1.2 mg/dL   GFR, Estimated >54 >09 mL/min   Anion gap 12 5 - 15  CBC     Status: Abnormal   Collection Time: 12/08/22  7:58 PM  Result Value Ref Range   WBC 14.1 (H) 4.0 - 10.5 K/uL   RBC 4.35 4.22 - 5.81 MIL/uL   Hemoglobin 15.1 13.0 - 17.0 g/dL   HCT 81.1 91.4 - 78.2 %   MCV 99.5 80.0 - 100.0 fL   MCH 34.7 (H) 26.0 - 34.0 pg   MCHC 34.9 30.0 - 36.0 g/dL   RDW 95.6 21.3 - 08.6 %   Platelets 435 (H) 150 - 400 K/uL   nRBC 0.0 0.0 - 0.2 %  Ethanol     Status: Abnormal   Collection Time: 12/08/22  7:58 PM  Result Value Ref Range   Alcohol, Ethyl (B) 270 (H) <10 mg/dL  Lactic acid, plasma     Status: Abnormal   Collection Time: 12/08/22  7:58 PM  Result Value Ref Range   Lactic Acid, Venous 2.1 (HH) 0.5 - 1.9 mmol/L  Protime-INR     Status: None   Collection Time: 12/08/22  7:58 PM  Result Value Ref Range   Prothrombin Time 14.0 11.4 - 15.2 seconds   INR 1.1 0.8 - 1.2  Type and screen [facility]     Status: None   Collection Time: 12/08/22  7:58 PM  Result Value Ref Range   ABO/RH(D) A POS    Antibody Screen NEG    Sample Expiration      12/11/2022,2359 Performed at [facility], [address], [place], [place] [address]   I-Stat Chem 8, ED     Status: Abnormal   Collection Time: 12/08/22  8:00 PM  Result Value Ref Range   Sodium 135 135 - 145 mmol/L   Potassium 3.7 3.5 - 5.1 mmol/L   Chloride 100 98 - 111 mmol/L   BUN 5 (L) 6 - 20 mg/dL   Creatinine, Ser 9.62 0.61 - 1.24 mg/dL   Glucose, Bld 952 (H) 70 - 99 mg/dL   Calcium, Ion 8.41 (L) 1.15 - 1.40 mmol/L   TCO2  20 (L) 22 - 32 mmol/L   Hemoglobin 16.3 13.0 - 17.0 g/dL   HCT 32.4 40.1 - 02.7 %  Urinalysis, Routine w reflex microscopic -Urine, Catheterized     Status: Abnormal   Collection Time: 12/08/22  8:50 PM  Result Value Ref Range   Color, Urine STRAW (A) YELLOW   APPearance CLEAR CLEAR   Specific Gravity, Urine >1.046 (H) 1.005 - 1.030   pH 5.0 5.0 - 8.0   Glucose, UA NEGATIVE NEGATIVE mg/dL   Hgb urine dipstick NEGATIVE NEGATIVE   Bilirubin Urine NEGATIVE NEGATIVE   Ketones, ur NEGATIVE NEGATIVE mg/dL   Protein, ur NEGATIVE NEGATIVE mg/dL   Nitrite NEGATIVE NEGATIVE   Leukocytes,Ua NEGATIVE NEGATIVE  I-Stat arterial blood gas, ED     Status: Abnormal   Collection Time: 12/08/22  9:40 PM  Result Value Ref Range   pH, Arterial 7.257 (L) 7.35 - 7.45   pCO2 arterial 50.3 (H) 32 - 48 mmHg   pO2, Arterial 554 (H) 83 - 108 mmHg   Bicarbonate 22.4 20.0 - 28.0 mmol/L   TCO2 24 22 - 32 mmol/L   O2 Saturation 100 %   Acid-base deficit 5.0 (H) 0.0 - 2.0 mmol/L   Sodium 134 (L) 135 - 145 mmol/L   Potassium 4.0 3.5 - 5.1 mmol/L   Calcium, Ion 1.09 (L) 1.15 - 1.40 mmol/L   HCT 42.0 39.0 - 52.0 %   Hemoglobin 14.3 13.0 - 17.0 g/dL   Patient temperature 16.1 F    Collection site RADIAL, ALLEN'S TEST ACCEPTABLE    Drawn by Operator    Sample type ARTERIAL   HIV Antibody (routine testing w rflx)     Status: None   Collection Time: 12/08/22 11:35 PM  Result Value Ref Range   HIV Screen 4th Generation wRfx Non Reactive Non Reactive  Triglycerides     Status: None   Collection Time: 12/09/22  1:51 AM  Result Value Ref Range   Triglycerides 106 <150 mg/dL  CBC     Status: Abnormal   Collection Time: 12/09/22  1:51 AM  Result Value Ref Range   WBC 15.1 (H) 4.0 - 10.5 K/uL   RBC 4.12 (L) 4.22 - 5.81 MIL/uL   Hemoglobin 14.3 13.0 - 17.0 g/dL   HCT 09.6 04.5 - 40.9 %   MCV 104.1 (H) 80.0 - 100.0 fL   MCH 34.7 (H) 26.0 - 34.0 pg   MCHC 33.3 30.0 - 36.0 g/dL   RDW 81.1 91.4 - 78.2 %    Platelets 359 150 - 400 K/uL   nRBC 0.0 0.0 - 0.2 %  Basic metabolic panel     Status: Abnormal   Collection Time: 12/09/22  1:51 AM  Result Value Ref Range   Sodium 135 135 - 145 mmol/L   Potassium 4.2 3.5 - 5.1 mmol/L   Chloride 104 98 - 111 mmol/L   CO2 19 (L) 22 - 32 mmol/L   Glucose, Bld 94 70 - 99 mg/dL   BUN <5 (L) 6 - 20 mg/dL   Creatinine, Ser 9.56 0.61 - 1.24 mg/dL   Calcium 7.6 (L) 8.9 - 10.3 mg/dL   GFR, Estimated >21 >30 mL/min   Anion gap 12 5 - 15  Hepatic function panel     Status: Abnormal   Collection Time: 12/09/22  1:51 AM  Result Value Ref Range   Total Protein 6.2 (L) 6.5 - 8.1 g/dL   Albumin 3.3 (L) 3.5 - 5.0 g/dL   AST 48 (H) 15 - 41 U/L   ALT 51 (H) 0 - 44 U/L   Alkaline Phosphatase 90 38 - 126 U/L   Total Bilirubin 0.4 0.3 - 1.2 mg/dL   Bilirubin, Direct <8.6 0.0 - 0.2 mg/dL   Indirect  Bilirubin NOT CALCULATED 0.3 - 0.9 mg/dL  MRSA Next Gen by PCR, Nasal     Status: None   Collection Time: 12/09/22  2:42 AM   Specimen: Nasal Mucosa; Nasal Swab  Result Value Ref Range   MRSA by PCR Next Gen NOT DETECTED NOT DETECTED  Triglycerides     Status: None   Collection Time: 12/09/22  6:34 AM  Result Value Ref Range   Triglycerides 117 <150 mg/dL    Assessment & Plan: S/p fall TBI with SAH, SDH, Rt temporal bone fx Rt parietal fx   Appreciate Dr Franky Macho.  Repeat CT head this morning stable Possible ICA dissection- IR consult for angio and start ASA per Dr. Franky Macho rec Right mastoid fx Fx thru spenhoids into carotid canals; vertical mastoid fx Alcohol intoxication Mild hyponatremia Mild elevated transaminases     Ciwa Trend labs Serial neuro checks  Hold chemical vte prophylaxis   On arrival patient's vital signs were stable.  He was not protecting his airway.  His pupils were equal and reactive but he had a left lateral gaze.  He had some spontaneous movement but no following commands.  No verbal response.  He was intubated to protect his  airway.  Because of some of his movements and lateral gaze there was concern for potential seizure activity so patient was immediately started on Keppra as well.  LOS: 1 day   Additional comments: I reviewed the patient's CT imaging from this morning with report as per above as well as recent labs and vitals.  Critical Care Total Time*: 45 Minutes  Berna Bue MD FACS Trauma & General Surgery Use [email or web address] to contact on call provider  12/09/2022  *Care during the described time interval was provided by me. I have reviewed this patient's available data, including medical history, events of note, physical examination and test results as part of my evaluation.
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
## 2022-12-09 NOTE — Progress Notes (Signed)
RT transported Pt from 4N15 to CT then to IR and back to Unit without any complications. RN at bedside.

## 2022-12-09 NOTE — Progress Notes (Signed)
Pt transported to 4N15 on vent. No complications noted

## 2022-12-10 LAB — CBC
HCT: 39.4 % (ref 39.0–52.0)
Hemoglobin: 13 g/dL (ref 13.0–17.0)
MCH: 35.1 pg — ABNORMAL HIGH (ref 26.0–34.0)
MCHC: 33 g/dL (ref 30.0–36.0)
MCV: 106.5 fL — ABNORMAL HIGH (ref 80.0–100.0)
Platelets: 280 10*3/uL (ref 150–400)
RBC: 3.7 MIL/uL — ABNORMAL LOW (ref 4.22–5.81)
RDW: 13.5 % (ref 11.5–15.5)
WBC: 15.8 10*3/uL — ABNORMAL HIGH (ref 4.0–10.5)
nRBC: 0 % (ref 0.0–0.2)

## 2022-12-10 LAB — BASIC METABOLIC PANEL
Anion gap: 7 (ref 5–15)
BUN: 5 mg/dL — ABNORMAL LOW (ref 6–20)
CO2: 24 mmol/L (ref 22–32)
Calcium: 8 mg/dL — ABNORMAL LOW (ref 8.9–10.3)
Chloride: 103 mmol/L (ref 98–111)
Creatinine, Ser: 0.7 mg/dL (ref 0.61–1.24)
GFR, Estimated: >60 mL/min (ref >60)
Glucose, Bld: 108 mg/dL — ABNORMAL HIGH (ref 70–99)
Potassium: 4 mmol/L (ref 3.5–5.1)
Sodium: 134 mmol/L — ABNORMAL LOW (ref 135–145)

## 2022-12-10 MED ORDER — THIAMINE HCL 100 MG/ML IJ SOLN: 100.0000 mg | Freq: Every day | INTRAMUSCULAR | Status: DC

## 2022-12-10 MED ORDER — PANTOPRAZOLE SODIUM 40 MG IV SOLR
40.0000 mg | Freq: Every day | INTRAVENOUS | Status: DC
  Administered 2022-12-10 – 2022-12-17 (×8): 40 mg via INTRAVENOUS
  Filled 2022-12-10 (×8): qty 10

## 2022-12-10 MED ORDER — FOLIC ACID 1 MG PO TABS
1.0000 mg | ORAL_TABLET | Freq: Every day | ORAL | Status: DC
  Administered 2022-12-11 – 2022-12-31 (×21): 1 mg
  Filled 2022-12-10 (×21): qty 1

## 2022-12-10 MED ORDER — LORAZEPAM 1 MG PO TABS: 1.0000 mg | ORAL_TABLET | ORAL | Status: AC | PRN

## 2022-12-10 MED ORDER — ADULT MULTIVITAMIN W/MINERALS CH
1.0000 | ORAL_TABLET | Freq: Every day | ORAL | Status: DC
  Administered 2022-12-11 – 2022-12-31 (×21): 1
  Filled 2022-12-10 (×21): qty 1

## 2022-12-10 MED ORDER — THIAMINE MONONITRATE 100 MG PO TABS
100.0000 mg | ORAL_TABLET | Freq: Every day | ORAL | Status: DC
  Administered 2022-12-11 – 2022-12-31 (×21): 100 mg
  Filled 2022-12-10 (×21): qty 1

## 2022-12-10 NOTE — Plan of Care (Signed)
Patient intubated and sedated. Nursing care directed towards progression of goals.

## 2022-12-10 NOTE — Progress Notes (Addendum)
Follow up - Trauma Critical Care   Patient Details:    Shawn Mills is an 42 y.o. male.  Lines/tubes : Airway (Active)  Secured at (cm) 23 cm 12/09/22 1140  Measured From Lips 12/09/22 1140  Secured Location Center 12/09/22 1140  Secured By [employer] 12/09/22 1140  Tube Holder Repositioned Yes 12/09/22 1140  Prone position No 12/09/22 1140  Cuff Pressure (cm H2O) Green OR 18-26 CmH2O 12/09/22 0841  Site Condition Dry 12/09/22 1140     NG/OG Vented/Dual Lumen 14 Fr. Oral (Active)  Tube Position (Required) External length of tube 12/09/22 0800  Measurement (cm) (Required) 63 cm 12/09/22 0800  Ongoing Placement Verification (Required) (See row information) Yes 12/09/22 0800  Site Assessment Clean, Dry, Intact 12/09/22 0800  Interventions Cleansed 12/09/22 0800  Status Clamped 12/09/22 0800  Drainage Appearance Bloody 12/09/22 0800     Urethral Catheter Ryan Hardy,RN Temperature probe 16 Fr. (Active)  Indication for Insertion or Continuance of Catheter Unstable critically ill patients first 24-48 hours (See Criteria) 12/09/22 0800  Site Assessment Clean, Dry, Intact 12/09/22 0800  Catheter Maintenance Bag below level of bladder;Catheter secured;Insertion date on drainage bag;Drainage bag/tubing not touching floor;No dependent loops;Seal intact 12/09/22 0800  Collection Container Standard drainage bag 12/09/22 0800  Securement Method Securing device (Describe) 12/09/22 0800  Urinary Catheter Interventions (if applicable) Unclamped 12/09/22 0800  Output (mL) 350 mL 12/09/22 0700    Microbiology/Sepsis markers: Results for orders placed or performed during the hospital encounter of 12/08/22  MRSA Next Gen by PCR, Nasal     Status: None   Collection Time: 12/09/22  2:42 AM   Specimen: Nasal Mucosa; Nasal Swab  Result Value Ref Range Status   MRSA by PCR Next Gen NOT DETECTED NOT DETECTED Final    Comment: (NOTE) The GeneXpert MRSA Assay (FDA approved for NASAL  specimens only), is one component of a comprehensive MRSA colonization surveillance program. It is not intended to diagnose MRSA infection nor to guide or monitor treatment for MRSA infections. Test performance is not FDA approved in patients less than 41 years old. Performed at [facility], [address] [place], [place] [address]     Anti-infectives:  Anti-infectives (From admission, onward)    None         Subjective:    Overnight Issues: No acute change  Objective:  Vital signs for last 24 hours: Temp:  [98.8 F (37.1 C)-100 F (37.8 C)] 99.7 F (37.6 C) (06/02 0900) Pulse Rate:  [68-88] 77 (06/02 0900) Resp:  [9-18] 11 (06/02 0900) BP: (103-128)/(61-102) 117/80 (06/02 0900) SpO2:  [95 %-100 %] 97 % (06/02 0900) FiO2 (%):  [40 %-100 %] 40 % (06/02 0800)  Hemodynamic parameters for last 24 hours:    Intake/Output from previous day: 06/01 0701 - 06/02 0700 In: 2499.4 [I.V.:2299.4; IV Piggyback:200] Out: 1025 [Urine:1025]  Intake/Output this shift: Total I/O In: 111.9 [I.V.:111.9] Out: 120 [Urine:120]  Vent settings for last 24 hours: Vent Mode: CPAP;PSV FiO2 (%):  [40 %-100 %] 40 % Set Rate:  [14 bmp] 14 bmp Vt Set:  [580 mL] 580 mL PEEP:  [5 cmH20] 5 cmH20 Pressure Support:  [10 cmH20] 10 cmH20 Plateau Pressure:  [17 cmH20-24 cmH20] 20 cmH20  Physical Exam:  Intubated, sedated, unresponsive, pupils pinpoint C-collar in place, Unlabored respirations on the vent Abdomen is soft, nondistended Extremities atraumatic  No results found for this or any previous visit (from the past 24 hour(s)).   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  ASA 81 per Dr. Franky Macho. Right mastoid fx- may  consult face this week Fx thru spenhoids into carotid canals; vertical mastoid fx Alcohol intoxication Mild hyponatremia Mild elevated transaminases     Ciwa Trend labs-ok this AM Serial neuro checks, continue prophylactic Keppra  Hold chemical vte prophylaxis   On arrival patient's vital signs were stable.  He was not protecting his airway.  His pupils were equal and reactive but he had a left lateral gaze.  He had some spontaneous movement but no following commands.  No verbal response.   I updated his dad and mom by phone this afternoon   LOS: 2 days   Additional comments: I reviewed the patient's CT imaging from this morning with report as per above as well as recent labs and vitals.  Critical Care Total Time*: 43 Minutes  Berna Bue MD FACS Trauma & General Surgery Use [email or web address] to contact on call provider  12/10/2022  *Care during the described time interval was provided by me. I have reviewed this patient's available data, including medical history, events of note, physical examination and test results as part of my evaluation.

## 2022-12-10 NOTE — Progress Notes (Signed)
Patient ID: Shawn Mills, male   DOB: Jan 11, 1981, 42 y.o.   MRN: [id number] BP 114/81   Pulse 73   Temp 99.5 F (37.5 C)   Resp 16   Ht 5\' 10"  (1.778 m)   Wt 96.7 kg   SpO2 98%   BMI 30.59 kg/m  Opens eyes to moderate stimulation, frowns when I open eyes and shine a light Perrl,  +cough Moving all extremities Not following commands Improved since yesterday Remains ventilated

## 2022-12-11 ENCOUNTER — Inpatient Hospital Stay (HOSPITAL_COMMUNITY): Payer: Medicaid Other

## 2022-12-11 LAB — CBC
HCT: 37.7 % — ABNORMAL LOW (ref 39.0–52.0)
Hemoglobin: 12.2 g/dL — ABNORMAL LOW (ref 13.0–17.0)
MCH: 34.3 pg — ABNORMAL HIGH (ref 26.0–34.0)
MCHC: 32.4 g/dL (ref 30.0–36.0)
MCV: 105.9 fL — ABNORMAL HIGH (ref 80.0–100.0)
Platelets: 294 10*3/uL (ref 150–400)
RBC: 3.56 MIL/uL — ABNORMAL LOW (ref 4.22–5.81)
RDW: 13.2 % (ref 11.5–15.5)
WBC: 13.4 10*3/uL — ABNORMAL HIGH (ref 4.0–10.5)
nRBC: 0 % (ref 0.0–0.2)

## 2022-12-11 LAB — GLUCOSE, CAPILLARY
Glucose-Capillary: 154 mg/dL — ABNORMAL HIGH (ref 70–99)
Glucose-Capillary: 165 mg/dL — ABNORMAL HIGH (ref 70–99)
Glucose-Capillary: 190 mg/dL — ABNORMAL HIGH (ref 70–99)

## 2022-12-11 LAB — CULTURE, RESPIRATORY W GRAM STAIN

## 2022-12-11 LAB — MAGNESIUM: Magnesium: 1.9 mg/dL (ref 1.7–2.4)

## 2022-12-11 LAB — BASIC METABOLIC PANEL
Anion gap: 8 (ref 5–15)
BUN: <5 mg/dL — ABNORMAL LOW (ref 6–20)
CO2: 25 mmol/L (ref 22–32)
Calcium: 8 mg/dL — ABNORMAL LOW (ref 8.9–10.3)
Chloride: 105 mmol/L (ref 98–111)
Creatinine, Ser: 0.56 mg/dL — ABNORMAL LOW (ref 0.61–1.24)
GFR, Estimated: >60 mL/min (ref >60)
Glucose, Bld: 123 mg/dL — ABNORMAL HIGH (ref 70–99)
Potassium: 4.1 mmol/L (ref 3.5–5.1)
Sodium: 138 mmol/L (ref 135–145)

## 2022-12-11 MED ORDER — PIVOT 1.5 CAL PO LIQD
1000.0000 mL | ORAL | Status: DC
  Administered 2022-12-11 – 2022-12-27 (×23): 1000 mL
  Filled 2022-12-11 (×15): qty 1000

## 2022-12-11 MED ORDER — PROSOURCE TF20 ENFIT COMPATIBL EN LIQD: 60.0000 mL | Freq: Every day | ENTERAL | Status: DC

## 2022-12-11 MED ORDER — SODIUM CHLORIDE 0.9 % IV SOLN
2.0000 g | INTRAVENOUS | Status: AC
  Administered 2022-12-11 – 2022-12-15 (×5): 2 g via INTRAVENOUS
  Filled 2022-12-11 (×5): qty 20

## 2022-12-11 MED ORDER — VITAL HIGH PROTEIN PO LIQD: 1000.0000 mL | ORAL | Status: DC
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
## 2022-12-11 NOTE — Progress Notes (Signed)
Initial Nutrition Assessment  DOCUMENTATION CODES:   Not applicable  INTERVENTION:   Initiate tube feeding via Cortrak tube: Pivot 1.5 at 70 ml/h (1680 ml per day)  Provides 2520 kcal, 157 gm protein, 1276 ml free water daily  Continue MVI, thiamine, and folic acid per tube   NUTRITION DIAGNOSIS:   Increased nutrient needs related to  (TBI) as evidenced by estimated needs.  GOAL:   Patient will meet greater than or equal to 90% of their needs  MONITOR:   TF tolerance  REASON FOR ASSESSMENT:   Consult Enteral/tube feeding initiation and management  ASSESSMENT:   Pt admitted after fall with TBI with SAH, SDH, R temporal bone fx, R parietal fx, possible ICA dissection, R mastoid fx, fx through spenhoids, vertical mastoid fx.   Pt discussed during ICU rounds and with RN and MD.  Beola Cord being placed, no bridle per MD  No family present at time of visit.  Pt intubated but weaning.   Medications reviewed and include: colace, folic acid, MVI with minerals, protonix, miralax, thiamine  Fentanyl  Keppra Propofol @ 6 ml/hr provides 158 kcal   Labs reviewed:  ETOH 270    NUTRITION - FOCUSED PHYSICAL EXAM:  Flowsheet Row Most Recent Value  Orbital Region No depletion  Upper Arm Region No depletion  Thoracic and Lumbar Region No depletion  Buccal Region No depletion  Temple Region No depletion  Clavicle Bone Region No depletion  Clavicle and Acromion Bone Region No depletion  Scapular Bone Region No depletion  Dorsal Hand No depletion  Patellar Region No depletion  Anterior Thigh Region No depletion  Posterior Calf Region No depletion  Edema (RD Assessment) None  Hair Reviewed  Eyes Unable to assess  Mouth Reviewed  Skin Reviewed  Nails Reviewed       Diet Order:   Diet Order             Diet NPO time specified  Diet effective now                   EDUCATION NEEDS:   Not appropriate for education at this time  Skin:  Skin Assessment:  Reviewed RN Assessment  Last BM:  unknown  Height:   Ht Readings from Last 1 Encounters:  12/09/22 5\' 10"  (1.778 m)    Weight:   Wt Readings from Last 1 Encounters:  12/09/22 96.7 kg    BMI:  Body mass index is 30.59 kg/m.  Estimated Nutritional Needs:   Kcal:  2400-2600  Protein:  125-145 grams  Fluid:  >2L/day  Cammy Copa., RD, LDN, CNSC See AMiON for contact information

## 2022-12-11 NOTE — Progress Notes (Signed)
Patient ID: Shawn Mills, male   DOB: 1980-12-28, 42 y.o.   MRN: [id number] BP 126/68 (BP Location: Left Arm)   Pulse 77   Temp (!) 100.4 F (38 C) (Bladder)   Resp 17   Ht 5\' 10"  (1.778 m)   Wt 96.7 kg   SpO2 97%   BMI 30.59 kg/m  No response to voice Perrl,  +cough, +corneals Little to no movement to noxious stimuli in the extremities No neurological change.

## 2022-12-11 NOTE — Progress Notes (Signed)
Trauma/Critical Care Follow Up Note  Subjective:    Overnight Issues:   Objective:  Vital signs for last 24 hours: Temp:  [99 F (37.2 C)-100 F (37.8 C)] 99.3 F (37.4 C) (06/03 0800) Pulse Rate:  [67-80] 73 (06/03 1000) Resp:  [12-16] 15 (06/03 1000) BP: (100-124)/(61-81) 115/70 (06/03 1000) SpO2:  [94 %-99 %] 95 % (06/03 1000) FiO2 (%):  [40 %] 40 % (06/03 0725)  Hemodynamic parameters for last 24 hours:    Intake/Output from previous day: 06/02 0701 - 06/03 0700 In: 2874.9 [I.V.:2675.5; IV Piggyback:199.5] Out: 975 [Urine:975]  Intake/Output this shift: Total I/O In: 448.5 [I.V.:348.5; IV Piggyback:100] Out: 200 [Urine:200]  Vent settings for last 24 hours: Vent Mode: PSV;CPAP FiO2 (%):  [40 %] 40 % Set Rate:  [14 bmp] 14 bmp Vt Set:  [580 mL] 580 mL PEEP:  [5 cmH20] 5 cmH20 Pressure Support:  [5 cmH20-10 cmH20] 5 cmH20 Plateau Pressure:  [16 cmH20] 16 cmH20  Physical Exam:  Gen: comfortable, no distress Neuro: not following commands HEENT: PERRL Neck: supple CV: RRR Pulm: unlabored breathing on mechanical ventilation Abd: soft, NT    GU: urine clear and yellow  Extr: wwp, no edema  Results for orders placed or performed during the hospital encounter of 12/08/22 (from the past 24 hour(s))  CBC     Status: Abnormal   Collection Time: 12/11/22  9:30 AM  Result Value Ref Range   WBC 13.4 (H) 4.0 - 10.5 K/uL   RBC 3.56 (L) 4.22 - 5.81 MIL/uL   Hemoglobin 12.2 (L) 13.0 - 17.0 g/dL   HCT 16.1 (L) 09.6 - 04.5 %   MCV 105.9 (H) 80.0 - 100.0 fL   MCH 34.3 (H) 26.0 - 34.0 pg   MCHC 32.4 30.0 - 36.0 g/dL   RDW 40.9 81.1 - 91.4 %   Platelets 294 150 - 400 K/uL   nRBC 0.0 0.0 - 0.2 %  Basic metabolic panel     Status: Abnormal   Collection Time: 12/11/22  9:30 AM  Result Value Ref Range   Sodium 138 135 - 145 mmol/L   Potassium 4.1 3.5 - 5.1 mmol/L   Chloride 105 98 - 111 mmol/L   CO2 25 22 - 32 mmol/L   Glucose, Bld 123 (H) 70 - 99 mg/dL   BUN <5  (L) 6 - 20 mg/dL   Creatinine, Ser 7.82 (L) 0.61 - 1.24 mg/dL   Calcium 8.0 (L) 8.9 - 10.3 mg/dL   GFR, Estimated >95 >62 mL/min   Anion gap 8 5 - 15  Magnesium     Status: None   Collection Time: 12/11/22  9:30 AM  Result Value Ref Range   Magnesium 1.9 1.7 - 2.4 mg/dL    Assessment & Plan: The plan of care was discussed with the bedside nurse for the day, who is in agreement with this plan and no additional concerns were raised.   Present on Admission:  TBI (traumatic brain injury) (HCC)    LOS: 3 days   Additional comments:I reviewed the patient's new clinical lab test results.   and I reviewed the patients new imaging test results.    S/p fall  TBI with SAH, SDH - NSGY c/s, Dr. Franky Macho, monitor neuro exam, keppra x7d for sz ppx. Repeat CT head 6/1 stable. R temporal bone and parietal skull fx - NSGY c/s, Dr. Franky Macho    Possible ICA dissection - four-vessel cerebral arteriogram negative except for occluded right internal jugular and  right sigmoid sinus veins.  ASA 81 per Dr. Franky Macho. Right mastoid fx - ENT c/s Fx thru spenhoids into carotid canals; vertical mastoid fx Alcohol intoxication - CIWA, TOC c/s Mild elevated transaminases - trend Suspected aspiration PNA - send resp cx, start empiric abx FEN - NPO, start TF DVT - SCDs Dispo - ICU  Critical Care Total Time: 40 minutes  Diamantina Monks, MD Trauma & General Surgery Please use [email or web address] to contact on call provider  12/11/2022  *Care during the described time interval was provided by me. I have reviewed this patient's available data, including medical history, events of note, physical examination and test results as part of my evaluation.

## 2022-12-12 LAB — CULTURE, RESPIRATORY W GRAM STAIN

## 2022-12-12 LAB — GLUCOSE, CAPILLARY
Glucose-Capillary: 166 mg/dL — ABNORMAL HIGH (ref 70–99)
Glucose-Capillary: 142 mg/dL — ABNORMAL HIGH (ref 70–99)
Glucose-Capillary: 162 mg/dL — ABNORMAL HIGH (ref 70–99)
Glucose-Capillary: 126 mg/dL — ABNORMAL HIGH (ref 70–99)
Glucose-Capillary: 157 mg/dL — ABNORMAL HIGH (ref 70–99)
Glucose-Capillary: 142 mg/dL — ABNORMAL HIGH (ref 70–99)

## 2022-12-12 LAB — TRIGLYCERIDES: Triglycerides: 76 mg/dL (ref <150)

## 2022-12-12 MED ORDER — MAGNESIUM HYDROXIDE 400 MG/5ML PO SUSP
30.0000 mL | Freq: Once | ORAL | Status: AC
  Administered 2022-12-12: 30 mL
  Filled 2022-12-12: qty 30

## 2022-12-12 MED ORDER — BISACODYL 10 MG RE SUPP
10.0000 mg | Freq: Once | RECTAL | Status: AC
  Administered 2022-12-12: 10 mg via RECTAL
  Filled 2022-12-12: qty 1

## 2022-12-12 MED ORDER — SENNOSIDES-DOCUSATE SODIUM 8.6-50 MG PO TABS
2.0000 | ORAL_TABLET | Freq: Once | ORAL | Status: AC
  Administered 2022-12-12: 2
  Filled 2022-12-12: qty 2

## 2022-12-12 MED ORDER — ENOXAPARIN SODIUM 40 MG/0.4ML IJ SOSY
40.0000 mg | PREFILLED_SYRINGE | Freq: Two times a day (BID) | INTRAMUSCULAR | Status: DC
  Administered 2022-12-12 – 2023-01-04 (×47): 40 mg via SUBCUTANEOUS
  Filled 2022-12-12 (×47): qty 0.4

## 2022-12-12 NOTE — Progress Notes (Signed)
Patient ID: Shawn Mills, male   DOB: [birth year], 42 y.o.   MRN: [id number] BP 120/72   Pulse 71   Temp 99.5 F (37.5 C)   Resp 13   Ht 5\' 10"  (1.778 m)   Wt 99.6 kg   SpO2 100%   BMI 31.51 kg/m  Intubated and sedated Not following commands +cough, +corneals No neurological changes. Head CT tomorrow

## 2022-12-12 NOTE — Progress Notes (Signed)
Trauma/Critical Care Follow Up Note  Subjective:    Overnight Issues:   Objective:  Vital signs for last 24 hours: Temp:  [99.3 F (37.4 C)-100.8 F (38.2 C)] 99.3 F (37.4 C) (06/04 0800) Pulse Rate:  [66-90] 66 (06/04 0900) Resp:  [11-23] 13 (06/04 0900) BP: (110-140)/(66-76) 133/76 (06/04 0900) SpO2:  [94 %-98 %] 95 % (06/04 0900) FiO2 (%):  [40 %] 40 % (06/04 0722) Weight:  [99.6 kg] 99.6 kg (06/04 0500)  Hemodynamic parameters for last 24 hours:    Intake/Output from previous day: 06/03 0701 - 06/04 0700 In: 2225.8 [I.V.:706.6; NG/GT:1219.2; IV Piggyback:300] Out: 1100 [Urine:1100]  Intake/Output this shift: Total I/O In: 85.3 [I.V.:15.3; NG/GT:70] Out: 75 [Urine:75]  Vent settings for last 24 hours: Vent Mode: PSV;CPAP FiO2 (%):  [40 %] 40 % Set Rate:  [14 bmp] 14 bmp Vt Set:  [580 mL] 580 mL PEEP:  [5 cmH20] 5 cmH20 Pressure Support:  [5 cmH20-10 cmH20] 5 cmH20  Physical Exam:  Gen: comfortable, no distress Neuro: not following commands HEENT: PERRL Neck: supple CV: RRR Pulm: unlabored breathing on mechanical ventilation Abd: soft, NT    GU: urine clear and yellow, +Foley Extr: wwp, no edema  Results for orders placed or performed during the hospital encounter of 12/08/22 (from the past 24 hour(s))  Culture, Respiratory w Gram Stain     Status: None (Preliminary result)   Collection Time: 12/11/22 10:24 AM   Specimen: Tracheal Aspirate; Respiratory  Result Value Ref Range   Specimen Description TRACHEAL ASPIRATE    Special Requests NONE    Gram Stain      ABUNDANT WBC PRESENT, PREDOMINANTLY PMN RARE SQUAMOUS EPITHELIAL CELLS PRESENT FEW GRAM POSITIVE COCCI MODERATE GRAM NEGATIVE RODS FEW GRAM POSITIVE RODS Performed at [facility], [address], [place], [place] [address]    Culture PENDING    Report Status PENDING   Glucose, capillary     Status: Abnormal   Collection Time: 12/11/22  3:13 PM  Result Value Ref Range    Glucose-Capillary 165 (H) 70 - 99 mg/dL  Glucose, capillary     Status: Abnormal   Collection Time: 12/11/22  7:20 PM  Result Value Ref Range   Glucose-Capillary 190 (H) 70 - 99 mg/dL  Glucose, capillary     Status: Abnormal   Collection Time: 12/11/22 11:33 PM  Result Value Ref Range   Glucose-Capillary 154 (H) 70 - 99 mg/dL  Glucose, capillary     Status: Abnormal   Collection Time: 12/12/22  3:16 AM  Result Value Ref Range   Glucose-Capillary 166 (H) 70 - 99 mg/dL  Triglycerides     Status: None   Collection Time: 12/12/22  7:49 AM  Result Value Ref Range   Triglycerides 76 <150 mg/dL  Glucose, capillary     Status: Abnormal   Collection Time: 12/12/22  7:50 AM  Result Value Ref Range   Glucose-Capillary 142 (H) 70 - 99 mg/dL    Assessment & Plan: The plan of care was discussed with the bedside nurse for the day, who is in agreement with this plan and no additional concerns were raised.   Present on Admission:  TBI (traumatic brain injury) (HCC)    LOS: 4 days   Additional comments:I reviewed the patient's new clinical lab test results.   and I reviewed the patients new imaging test results.    S/p fall   TBI with SAH, SDH - NSGY c/s, Dr. Franky Macho, monitor neuro exam,  keppra x7d for sz ppx. Repeat CT head 6/1 stable. R temporal bone and parietal skull fx - NSGY c/s, Dr. Franky Macho    Possible ICA dissection - four-vessel cerebral arteriogram negative except for occluded right internal jugular and right sigmoid sinus veins. ASA 81 per Dr. Franky Macho. Right mastoid fx - ENT c/s, Dr. Jearld Fenton Fx thru spenhoids into carotid canals; vertical mastoid fx Alcohol intoxication - CIWA, TOC c/s Mild elevated transaminases - trend Suspected aspiration PNA - send resp cx, start empiric abx FEN - NPO, TF DVT - SCDs Dispo - ICU  Clinical update provided to the mother via phone. All questions answered to her satisfaction.  Critical Care Total Time: 45 minutes  Diamantina Monks,  MD Trauma & General Surgery Please use [email or web address] to contact on call provider  12/12/2022  *Care during the described time interval was provided by me. I have reviewed this patient's available data, including medical history, events of note, physical examination and test results as part of my evaluation.

## 2022-12-12 NOTE — Consult Note (Signed)
Reason for Consult:facial fractures Referring Physician: DR Preston Shawn Mills is an 42 y.o. male.  HPI: [id number] suspected fall after drinking    He was brought by ems to the [facility] ED. He was unresponsive upon arrival, noted to have bleeding from right ear, breathing spontaneously, some movement on left greater than right side. He has not been awakening. He has no further bleeding or fluid from the right ear. Face has been moving according to nurse  No past medical history on file.    No family history on file.  Social History:  has no history on file for tobacco use, alcohol use, and drug use.  Allergies: No Known Allergies  Medications: I have reviewed the patient's current medications.  Results for orders placed or performed during the hospital encounter of 12/08/22 (from the past 48 hour(s))  CBC     Status: Abnormal   Collection Time: 12/10/22 10:40 AM  Result Value Ref Range   WBC 15.8 (H) 4.0 - 10.5 K/uL   RBC 3.70 (L) 4.22 - 5.81 MIL/uL   Hemoglobin 13.0 13.0 - 17.0 g/dL   HCT 16.1 09.6 - 04.5 %   MCV 106.5 (H) 80.0 - 100.0 fL   MCH 35.1 (H) 26.0 - 34.0 pg   MCHC 33.0 30.0 - 36.0 g/dL   RDW 40.9 81.1 - 91.4 %   Platelets 280 150 - 400 K/uL   nRBC 0.0 0.0 - 0.2 %    Comment: Performed at [facility], [address] [place], [place] [address]  Basic metabolic panel     Status: Abnormal   Collection Time: 12/10/22 10:40 AM  Result Value Ref Range   Sodium 134 (L) 135 - 145 mmol/L   Potassium 4.0 3.5 - 5.1 mmol/L   Chloride 103 98 - 111 mmol/L   CO2 24 22 - 32 mmol/L   Glucose, Bld 108 (H) 70 - 99 mg/dL    Comment: Glucose reference range applies only to samples taken after fasting for at least 8 hours.   BUN 5 (L) 6 - 20 mg/dL   Creatinine, Ser 6.21 0.61 - 1.24 mg/dL   Calcium 8.0 (L) 8.9 - 10.3 mg/dL   GFR, Estimated >30 >86 mL/min    Comment: (NOTE) Calculated using the CKD-EPI Creatinine Equation (2021)    Anion gap 7 5 - 15    Comment: Performed at  [facility], [address], [place], [place] [address]  CBC     Status: Abnormal   Collection Time: 12/11/22  9:30 AM  Result Value Ref Range   WBC 13.4 (H) 4.0 - 10.5 K/uL   RBC 3.56 (L) 4.22 - 5.81 MIL/uL   Hemoglobin 12.2 (L) 13.0 - 17.0 g/dL   HCT 96.2 (L) 95.2 - 84.1 %   MCV 105.9 (H) 80.0 - 100.0 fL   MCH 34.3 (H) 26.0 - 34.0 pg   MCHC 32.4 30.0 - 36.0 g/dL   RDW 32.4 40.1 - 02.7 %   Platelets 294 150 - 400 K/uL   nRBC 0.0 0.0 - 0.2 %    Comment: Performed at [facility], [address], [place], [place] [address]  Basic metabolic panel     Status: Abnormal   Collection Time: 12/11/22  9:30 AM  Result Value Ref Range   Sodium 138 135 - 145 mmol/L   Potassium 4.1 3.5 - 5.1 mmol/L   Chloride 105 98 - 111 mmol/L   CO2 25 22 -  32 mmol/L   Glucose, Bld 123 (H) 70 - 99 mg/dL    Comment: Glucose reference range applies only to samples taken after fasting for at least 8 hours.   BUN <5 (L) 6 - 20 mg/dL   Creatinine, Ser 1.61 (L) 0.61 - 1.24 mg/dL   Calcium 8.0 (L) 8.9 - 10.3 mg/dL   GFR, Estimated >09 >60 mL/min    Comment: (NOTE) Calculated using the CKD-EPI Creatinine Equation (2021)    Anion gap 8 5 - 15    Comment: Performed at [facility], [address], [place], [place] [address]  Magnesium     Status: None   Collection Time: 12/11/22  9:30 AM  Result Value Ref Range   Magnesium 1.9 1.7 - 2.4 mg/dL    Comment: Performed at [facility], [address] [place], [place] [address]  Culture, Respiratory w Gram Stain     Status: None (Preliminary result)   Collection Time: 12/11/22 10:24 AM   Specimen: Tracheal Aspirate; Respiratory  Result Value Ref Range   Specimen Description TRACHEAL ASPIRATE    Special Requests NONE    Gram Stain      ABUNDANT WBC PRESENT, PREDOMINANTLY PMN RARE SQUAMOUS EPITHELIAL CELLS PRESENT FEW GRAM POSITIVE COCCI MODERATE GRAM NEGATIVE RODS FEW GRAM POSITIVE RODS Performed at [facility], [address] [place], [place] [address]    Culture PENDING    Report Status PENDING   Glucose, capillary     Status: Abnormal   Collection Time: 12/11/22  3:13 PM  Result Value Ref Range   Glucose-Capillary 165 (H) 70 - 99 mg/dL    Comment: Glucose reference range applies only to samples taken after fasting for at least 8 hours.  Glucose, capillary     Status: Abnormal   Collection Time: 12/11/22  7:20 PM  Result Value Ref Range   Glucose-Capillary 190 (H) 70 - 99 mg/dL    Comment: Glucose reference range applies only to samples taken after fasting for at least 8 hours.  Glucose, capillary     Status: Abnormal   Collection Time: 12/11/22 11:33 PM  Result Value Ref Range   Glucose-Capillary 154 (H) 70 - 99 mg/dL    Comment: Glucose reference range applies only to samples taken after fasting for at least 8 hours.  Glucose, capillary     Status: Abnormal   Collection Time: 12/12/22  3:16 AM  Result Value Ref Range   Glucose-Capillary 166 (H) 70 - 99 mg/dL    Comment: Glucose reference range applies only to samples taken after fasting for at least 8 hours.  Triglycerides     Status: None   Collection Time: 12/12/22  7:49 AM  Result Value Ref Range   Triglycerides 76 <150 mg/dL    Comment: Performed at [facility], [address] [place], [place] [address]  Glucose, capillary     Status: Abnormal   Collection Time: 12/12/22  7:50 AM  Result Value Ref Range   Glucose-Capillary 142 (H) 70 - 99 mg/dL    Comment: Glucose reference range applies only to samples taken after fasting for at least 8 hours.    DG Abd Portable 1V  Result Date: 12/11/2022 CLINICAL DATA:  Feeding tube placement EXAM: PORTABLE ABDOMEN - 1 VIEW COMPARISON:  12/09/2022 FINDINGS: There is interval removal of NG tube and placement of feeding tube. Tip of feeding tube is seen in the region of the antrum of the stomach. Bowel  gas pattern in the upper abdomen is unremarkable. IMPRESSION: Tip of feeding tube is seen in the  region of antrum of the stomach. Electronically Signed   By: Ernie Avena M.D.   On: 12/11/2022 12:36    ROS Blood pressure 133/76, pulse 66, temperature 99.3 F (37.4 C), temperature source Bladder, resp. rate 13, height 5\' 10"  (1.778 m), weight 99.6 kg, SpO2 95 %. Physical Exam Constitutional:      Comments: He is intubated and not responsive except to pain.  HENT:     Ears:     Comments: No fluid from ear. Could not find a otoscope. Pain stimulus revealed that his facial nerve appears to function    Mouth/Throat:     Comments: intubated      Assessment/Plan: Temporal bone fracture- there is fracture through the mastoid/ middle ear. He has no further leak. He will need audiogram in the future depending on how things go but no necessary or possible  in his current condition. No intervention right now. It looks like the facial nerve is intact.   Suzanna Obey 12/12/2022, 10:19 AM

## 2022-12-13 ENCOUNTER — Inpatient Hospital Stay (HOSPITAL_COMMUNITY): Payer: Medicaid Other

## 2022-12-13 LAB — CBC
HCT: 36.5 % — ABNORMAL LOW (ref 39.0–52.0)
Hemoglobin: 11.9 g/dL — ABNORMAL LOW (ref 13.0–17.0)
MCH: 34.5 pg — ABNORMAL HIGH (ref 26.0–34.0)
MCHC: 32.6 g/dL (ref 30.0–36.0)
MCV: 105.8 fL — ABNORMAL HIGH (ref 80.0–100.0)
Platelets: 361 10*3/uL (ref 150–400)
RBC: 3.45 MIL/uL — ABNORMAL LOW (ref 4.22–5.81)
RDW: 13.3 % (ref 11.5–15.5)
WBC: 10.7 10*3/uL — ABNORMAL HIGH (ref 4.0–10.5)
nRBC: 0 % (ref 0.0–0.2)

## 2022-12-13 LAB — GLUCOSE, CAPILLARY
Glucose-Capillary: 118 mg/dL — ABNORMAL HIGH (ref 70–99)
Glucose-Capillary: 140 mg/dL — ABNORMAL HIGH (ref 70–99)
Glucose-Capillary: 121 mg/dL — ABNORMAL HIGH (ref 70–99)
Glucose-Capillary: 159 mg/dL — ABNORMAL HIGH (ref 70–99)
Glucose-Capillary: 140 mg/dL — ABNORMAL HIGH (ref 70–99)
Glucose-Capillary: 142 mg/dL — ABNORMAL HIGH (ref 70–99)

## 2022-12-13 LAB — BASIC METABOLIC PANEL
Anion gap: 8 (ref 5–15)
BUN: 13 mg/dL (ref 6–20)
CO2: 31 mmol/L (ref 22–32)
Calcium: 8.3 mg/dL — ABNORMAL LOW (ref 8.9–10.3)
Chloride: 101 mmol/L (ref 98–111)
Creatinine, Ser: 0.6 mg/dL — ABNORMAL LOW (ref 0.61–1.24)
GFR, Estimated: >60 mL/min (ref >60)
Glucose, Bld: 107 mg/dL — ABNORMAL HIGH (ref 70–99)
Potassium: 3.8 mmol/L (ref 3.5–5.1)
Sodium: 140 mmol/L (ref 135–145)

## 2022-12-13 MED ORDER — IOHEXOL 350 MG/ML SOLN
75.0000 mL | Freq: Once | INTRAVENOUS | Status: AC | PRN
  Administered 2022-12-13: 75 mL via INTRAVENOUS

## 2022-12-13 NOTE — Progress Notes (Addendum)
Trauma/Critical Care Follow Up Note  Subjective:    Overnight Issues:   Objective:  Vital signs for last 24 hours: Temp:  [98.8 F (37.1 C)-100.5 F (38.1 C)] 98.9 F (37.2 C) (06/05 0800) Pulse Rate:  [66-81] 81 (06/05 0800) Resp:  [11-19] 13 (06/05 0800) BP: (115-131)/(67-102) 126/67 (06/05 0800) SpO2:  [95 %-100 %] 96 % (06/05 0800) FiO2 (%):  [40 %] 40 % (06/05 0800) Weight:  [102.8 kg] 102.8 kg (06/05 0500)  Hemodynamic parameters for last 24 hours:    Intake/Output from previous day: 06/04 0701 - 06/05 0700 In: 2364.7 [I.V.:454.7; NG/GT:1610; IV Piggyback:300] Out: 850 [Urine:850]  Intake/Output this shift: Total I/O In: 179 [I.V.:39; NG/GT:140] Out: -   Vent settings for last 24 hours: Vent Mode: CPAP;PSV FiO2 (%):  [40 %] 40 % Set Rate:  [14 bmp] 14 bmp Vt Set:  [580 mL-680 mL] 680 mL PEEP:  [5 cmH20] 5 cmH20 Pressure Support:  [5 cmH20] 5 cmH20 Plateau Pressure:  [16 cmH20-21 cmH20] 21 cmH20  Physical Exam:  Gen: comfortable, no distress Neuro: not following commands HEENT: PERRL Neck: supple CV: RRR Pulm: unlabored breathing on mechanical ventilation Abd: soft, NT    GU: urine clear and yellow, +Foley Extr: wwp, no edema  Results for orders placed or performed during the hospital encounter of 12/08/22 (from the past 24 hour(s))  Glucose, capillary     Status: Abnormal   Collection Time: 12/12/22 11:37 AM  Result Value Ref Range   Glucose-Capillary 162 (H) 70 - 99 mg/dL  Glucose, capillary     Status: Abnormal   Collection Time: 12/12/22  4:05 PM  Result Value Ref Range   Glucose-Capillary 126 (H) 70 - 99 mg/dL  Glucose, capillary     Status: Abnormal   Collection Time: 12/12/22  7:11 PM  Result Value Ref Range   Glucose-Capillary 157 (H) 70 - 99 mg/dL  Glucose, capillary     Status: Abnormal   Collection Time: 12/12/22 11:17 PM  Result Value Ref Range   Glucose-Capillary 142 (H) 70 - 99 mg/dL  Glucose, capillary     Status: Abnormal    Collection Time: 12/13/22  3:19 AM  Result Value Ref Range   Glucose-Capillary 118 (H) 70 - 99 mg/dL  Basic metabolic panel     Status: Abnormal   Collection Time: 12/13/22  3:52 AM  Result Value Ref Range   Sodium 140 135 - 145 mmol/L   Potassium 3.8 3.5 - 5.1 mmol/L   Chloride 101 98 - 111 mmol/L   CO2 31 22 - 32 mmol/L   Glucose, Bld 107 (H) 70 - 99 mg/dL   BUN 13 6 - 20 mg/dL   Creatinine, Ser 4.09 (L) 0.61 - 1.24 mg/dL   Calcium 8.3 (L) 8.9 - 10.3 mg/dL   GFR, Estimated >81 >19 mL/min   Anion gap 8 5 - 15  CBC     Status: Abnormal   Collection Time: 12/13/22  3:52 AM  Result Value Ref Range   WBC 10.7 (H) 4.0 - 10.5 K/uL   RBC 3.45 (L) 4.22 - 5.81 MIL/uL   Hemoglobin 11.9 (L) 13.0 - 17.0 g/dL   HCT 14.7 (L) 82.9 - 56.2 %   MCV 105.8 (H) 80.0 - 100.0 fL   MCH 34.5 (H) 26.0 - 34.0 pg   MCHC 32.6 30.0 - 36.0 g/dL   RDW 13.0 86.5 - 78.4 %   Platelets 361 150 - 400 K/uL   nRBC 0.0 0.0 -  0.2 %  Glucose, capillary     Status: Abnormal   Collection Time: 12/13/22  8:00 AM  Result Value Ref Range   Glucose-Capillary 140 (H) 70 - 99 mg/dL    Assessment & Plan: The plan of care was discussed with the bedside nurse for the day, who is in agreement with this plan and no additional concerns were raised.   Present on Admission:  TBI (traumatic brain injury) (HCC)    LOS: 5 days   Additional comments:I reviewed the patient's new clinical lab test results.   and I reviewed the patients new imaging test results.    S/p fall   TBI with SAH, SDH - NSGY c/s, Dr. Franky Macho, monitor neuro exam, keppra x7d for sz ppx. Repeat CT head 6/1 stable. Repeat CTA head/neck today.  R temporal bone and parietal skull fx - NSGY c/s, Dr. Franky Macho, ENT recs audiogram Possible ICA dissection - four-vessel cerebral arteriogram negative except for occluded right internal jugular and right sigmoid sinus veins. ASA 81 per Dr. Franky Macho. Right mastoid fx - ENT c/s, Dr. Jearld Fenton Fx thru spenhoids into carotid  canals; vertical mastoid fx Alcohol intoxication - CIWA, TOC c/s Mild elevated transaminases - trend Suspected aspiration PNA - resp cx with H flu, CTX x5d FEN - NPO, TF DVT - SCDs, LMWH Dispo - ICU  Critical Care Total Time: 45 minutes  Diamantina Monks, MD Trauma & General Surgery Please use [email or web address] to contact on call provider  12/13/2022  *Care during the described time interval was provided by me. I have reviewed this patient's available data, including medical history, events of note, physical examination and test results as part of my evaluation.

## 2022-12-13 NOTE — Progress Notes (Signed)
Patient ID: Shawn Mills, male   DOB: 03-03-1981, 42 y.o.   MRN: [id number] BP 134/78   Pulse 93   Temp 97.7 F (36.5 C) (Axillary)   Resp (!) 24   Ht 5\' 10"  (1.778 m)   Wt 102.8 kg   SpO2 95%   BMI 32.52 kg/m  Perrl, +cough, +corneals Not following commands Will wince with noxious stimuli Head CT unrevealing, no real change CT ANGIO HEAD NECK W WO CM  Result Date: 12/13/2022 CLINICAL DATA:  Subarachnoid hemorrhage [facility]) S/p 4 vessel angiogram 12/09/22. Patient with TBI, SDH, SAH EXAM: CT ANGIOGRAPHY HEAD AND NECK WITH AND WITHOUT CONTRAST TECHNIQUE: Multidetector CT imaging of the head and neck was performed using the standard protocol during bolus administration of intravenous contrast. Multiplanar CT image reconstructions and MIPs were obtained to evaluate the vascular anatomy. Carotid stenosis measurements (when applicable) are obtained utilizing NASCET criteria, using the distal internal carotid diameter as the denominator. RADIATION DOSE REDUCTION: This exam was performed according to the departmental dose-optimization program which includes automated exposure control, adjustment of the mA and/or kV according to patient size and/or use of iterative reconstruction technique. CONTRAST:  75mL OMNIPAQUE IOHEXOL 350 MG/ML SOLN COMPARISON:  CTA Dec 08, 2022. FINDINGS: CT HEAD FINDINGS Brain: Involving intraparenchymal contusion/hemorrhage in the inferior left frontal lobe with increased edema but no substantial mass effect. Small volume of subarachnoid hemorrhage is decreased. No evidence of acute large vascular territory infarct, mass lesion or midline shift. No hydrocephalus. Vascular: See below. Skull: Known fractures further characterized on prior CT head from Dec 08, 2022. Sinuses/Orbits: Known fractures further characterized on prior CT head from Dec 08, 2022. Persistent sinus disease. No acute orbital findings. Other: Small bilateral mastoid effusions. Review of the MIP images confirms the above  findings CTA NECK FINDINGS Aortic arch: Great vessel origins are patent without significant stenosis. Right carotid system: No evidence of dissection, stenosis (50% or greater), or occlusion. Left carotid system: No evidence of dissection, stenosis (50% or greater), or occlusion. Vertebral arteries: Codominant. No evidence of dissection, stenosis (50% or greater), or occlusion. Skeleton: No evidence of acute abnormality on limited assessment. Other neck: No acute abnormality on limited assessment. Upper chest: Patchy ground-glass opacities in the anterior left upper lobe Review of the MIP images confirms the above findings CTA HEAD FINDINGS Anterior circulation: Bilateral intracranial ICAs, MCAs, and ACAs are patent without proximal hemodynamically significant stenosis. Posterior circulation: Bilateral intradural vertebral arteries, basilar artery, and bilateral posterior cerebral arteries are patent without proximal hemodynamically significant stenosis. No aneurysm identified. Venous sinuses: Persistent non opacification the distal right transverse sinus, right sigmoid sinus and jugular bulb. Review of the MIP images confirms the above findings IMPRESSION: CT head: 1. Evolving intraparenchymal contusion/hemorrhage in the inferior left frontal lobe with increased edema but no substantial mass effect. 2. Small volume of subarachnoid hemorrhage is decreased. 3. Facial and calvarial fractures described on the prior CT head. CTA: 1. No large vessel occlusion or proximal hemodynamically significant stenosis. 2. Previously seen irregularity and linear filling defect within the ICAs has resolved. 3. 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 4. Patchy ground-glass opacities in the anterior left upper lobe, potentially consolidation, aspiration or pneumonia. Electronically Signed   By: Feliberto Harts M.D.   On: 12/13/2022 14:23   No  obvious reason for his lack of improvement. No change

## 2022-12-13 NOTE — Progress Notes (Signed)
RT transported patient from 4N15 to CT and back with RN. No complications. RT will continue to monitor. 

## 2022-12-14 ENCOUNTER — Inpatient Hospital Stay (HOSPITAL_COMMUNITY): Payer: Medicaid Other

## 2022-12-14 DIAGNOSIS — R569 Unspecified convulsions: Secondary | ICD-10-CM

## 2022-12-14 DIAGNOSIS — R561 Post traumatic seizures: Secondary | ICD-10-CM

## 2022-12-14 DIAGNOSIS — I609 Nontraumatic subarachnoid hemorrhage, unspecified: Secondary | ICD-10-CM | POA: Diagnosis not present

## 2022-12-14 LAB — CBC
HCT: 36.5 % — ABNORMAL LOW (ref 39.0–52.0)
Hemoglobin: 12.2 g/dL — ABNORMAL LOW (ref 13.0–17.0)
MCH: 35.2 pg — ABNORMAL HIGH (ref 26.0–34.0)
MCHC: 33.4 g/dL (ref 30.0–36.0)
MCV: 105.2 fL — ABNORMAL HIGH (ref 80.0–100.0)
Platelets: 405 10*3/uL — ABNORMAL HIGH (ref 150–400)
RBC: 3.47 MIL/uL — ABNORMAL LOW (ref 4.22–5.81)
RDW: 13.7 % (ref 11.5–15.5)
WBC: 12.9 10*3/uL — ABNORMAL HIGH (ref 4.0–10.5)
nRBC: 0 % (ref 0.0–0.2)

## 2022-12-14 LAB — GLUCOSE, CAPILLARY
Glucose-Capillary: 145 mg/dL — ABNORMAL HIGH (ref 70–99)
Glucose-Capillary: 120 mg/dL — ABNORMAL HIGH (ref 70–99)
Glucose-Capillary: 135 mg/dL — ABNORMAL HIGH (ref 70–99)
Glucose-Capillary: 127 mg/dL — ABNORMAL HIGH (ref 70–99)
Glucose-Capillary: 126 mg/dL — ABNORMAL HIGH (ref 70–99)
Glucose-Capillary: 127 mg/dL — ABNORMAL HIGH (ref 70–99)

## 2022-12-14 LAB — BASIC METABOLIC PANEL
Anion gap: 8 (ref 5–15)
BUN: 16 mg/dL (ref 6–20)
CO2: 31 mmol/L (ref 22–32)
Calcium: 8.7 mg/dL — ABNORMAL LOW (ref 8.9–10.3)
Chloride: 103 mmol/L (ref 98–111)
Creatinine, Ser: 0.61 mg/dL (ref 0.61–1.24)
GFR, Estimated: >60 mL/min (ref >60)
Glucose, Bld: 144 mg/dL — ABNORMAL HIGH (ref 70–99)
Potassium: 3.6 mmol/L (ref 3.5–5.1)
Sodium: 142 mmol/L (ref 135–145)

## 2022-12-14 NOTE — Procedures (Signed)
Patient Name: Shawn Mills  MRN: [id number]  Epilepsy Attending: Charlsie Quest  Referring Physician/Provider: Diamantina Monks, MD  Date: 12/14/2022 Duration: 26.14 mins  Patient history: 42yo M with TBI, SAH, SDH getting eeg to evaluate for seizure.  Level of alertness: comatose  AEDs during EEG study: LEV, Propofol  Technical aspects: This EEG study was done with scalp electrodes positioned according to the 10-20 International system of electrode placement. Electrical activity was reviewed with band pass filter of 1-70Hz , sensitivity of 7 uV/mm, display speed of 28mm/sec with a 60Hz  notched filter applied as appropriate. EEG data were recorded continuously and digitally stored.  Video monitoring was available and reviewed as appropriate.  Description: EEG showed continuous generalized 3 to 7 Hz theta-delta slowing. Hyperventilation and photic stimulation were not performed.     ABNORMALITY - Continuous slow, generalized  IMPRESSION: This study is suggestive of severe diffuse encephalopathy, nonspecific etiology. No seizures or epileptiform discharges were seen throughout the recording.  Atina Feeley Annabelle Harman

## 2022-12-14 NOTE — Progress Notes (Signed)
Trauma/Critical Care Follow Up Note  Subjective:    Overnight Issues:   Objective:  Vital signs for last 24 hours: Temp:  [97.7 F (36.5 C)-100.3 F (37.9 C)] 99.3 F (37.4 C) (06/06 0800) Pulse Rate:  [67-93] 81 (06/06 0800) Resp:  [11-29] 15 (06/06 0800) BP: (112-153)/(68-94) 141/82 (06/06 0800) SpO2:  [89 %-99 %] 97 % (06/06 0800) FiO2 (%):  [40 %] 40 % (06/06 0751) Weight:  [102.9 kg] 102.9 kg (06/06 0500)  Hemodynamic parameters for last 24 hours:    Intake/Output from previous day: 06/05 0701 - 06/06 0700 In: 2443.1 [I.V.:496.5; ZO/XW:9604.5; IV Piggyback:300] Out: 1225 [Urine:825; Stool:400]  Intake/Output this shift: Total I/O In: 33.1 [I.V.:33.1] Out: -   Vent settings for last 24 hours: Vent Mode: CPAP;PSV FiO2 (%):  [40 %] 40 % Set Rate:  [14 bmp] 14 bmp Vt Set:  [580 mL] 580 mL PEEP:  [5 cmH20] 5 cmH20 Pressure Support:  [5 cmH20] 5 cmH20 Plateau Pressure:  [15 cmH20-18 cmH20] 18 cmH20  Physical Exam:  Gen: comfortable, no distress Neuro: opens eyes this AM HEENT: PERRL Neck: supple CV: RRR Pulm: unlabored breathing on mechanical ventilation Abd: soft, NT    GU: urine clear and yellow, +spontaneous voids Extr: wwp, no edema  Results for orders placed or performed during the hospital encounter of 12/08/22 (from the past 24 hour(s))  Glucose, capillary     Status: Abnormal   Collection Time: 12/13/22 11:21 AM  Result Value Ref Range   Glucose-Capillary 121 (H) 70 - 99 mg/dL  Glucose, capillary     Status: Abnormal   Collection Time: 12/13/22  4:02 PM  Result Value Ref Range   Glucose-Capillary 159 (H) 70 - 99 mg/dL  Glucose, capillary     Status: Abnormal   Collection Time: 12/13/22  7:11 PM  Result Value Ref Range   Glucose-Capillary 140 (H) 70 - 99 mg/dL  Glucose, capillary     Status: Abnormal   Collection Time: 12/13/22 11:11 PM  Result Value Ref Range   Glucose-Capillary 142 (H) 70 - 99 mg/dL  Glucose, capillary     Status:  Abnormal   Collection Time: 12/14/22  3:08 AM  Result Value Ref Range   Glucose-Capillary 145 (H) 70 - 99 mg/dL  Glucose, capillary     Status: Abnormal   Collection Time: 12/14/22  8:10 AM  Result Value Ref Range   Glucose-Capillary 120 (H) 70 - 99 mg/dL    Assessment & Plan: The plan of care was discussed with the bedside nurse for the day, who is in agreement with this plan and no additional concerns were raised.   Present on Admission:  TBI (traumatic brain injury) (HCC)    LOS: 6 days   Additional comments:I reviewed the patient's new clinical lab test results.   and I reviewed the patients new imaging test results.    S/p fall   TBI with SAH, SDH - NSGY c/s, Dr. Franky Macho, monitor neuro exam, keppra x7d for sz ppx. Repeat CT head 6/1 stable. Repeat CTA head/neck 6/5. EEG negative 6/5.  R temporal bone and parietal skull fx - NSGY c/s, Dr. Franky Macho, ENT recs audiogram Possible ICA dissection - four-vessel cerebral arteriogram negative except for occluded right internal jugular and right sigmoid sinus veins. ASA 81 per Dr. Franky Macho. Right mastoid fx - ENT c/s, Dr. Jearld Fenton Fx thru spenhoids into carotid canals; vertical mastoid fx Alcohol intoxication - CIWA, TOC c/s Mild elevated transaminases - trend Suspected aspiration PNA -  resp cx with H flu, CTX x5d FEN - NPO, TF DVT - SCDs, LMWH Dispo - ICU  Critical Care Total Time: 35 minutes  Diamantina Monks, MD Trauma & General Surgery Please use [email or web address] to contact on call provider  12/14/2022  *Care during the described time interval was provided by me. I have reviewed this patient's available data, including medical history, events of note, physical examination and test results as part of my evaluation.

## 2022-12-14 NOTE — Progress Notes (Signed)
Patient ID: Shawn Mills, male   DOB: 1981/05/28, 42 y.o.   MRN: [id number] BP 122/73   Pulse 69   Temp 99.9 F (37.7 C) (Axillary)   Resp 14   Ht 5\' 10"  (1.778 m)   Wt 102.9 kg   SpO2 100%   BMI 32.55 kg/m  Localizing, pulled out coretrack, tried to extubate himself Moving left >>> right Weaning attempted, not successful, quite agitated

## 2022-12-14 NOTE — Progress Notes (Signed)
@   0300 found patient leaning down in bed with his cortrak half pulled out with restraint still in place. Tube feeds immediately stopped. Attempted to readvance cortrak back to correct measurement but when auscultating for possible placement it was occluded. Removed cortrak and saved it at bedside. Placed a new NGT. Will get X ray to confirm.

## 2022-12-14 NOTE — Progress Notes (Signed)
Bladder scanned the pt... pt retaining 404 cc .... Went to I/O cath pt when he voided on himself.... re-scanned him and 0cc registered.... Will continue to monitor.

## 2022-12-14 NOTE — Progress Notes (Signed)
Per Dr. Bedelia Person... OG tube in place and ok to use. Restarted TF.

## 2022-12-14 NOTE — Progress Notes (Signed)
EEG complete - results pending 

## 2022-12-15 ENCOUNTER — Inpatient Hospital Stay (HOSPITAL_COMMUNITY): Payer: Medicaid Other

## 2022-12-15 LAB — CBC
HCT: 35.4 % — ABNORMAL LOW (ref 39.0–52.0)
Hemoglobin: 11.7 g/dL — ABNORMAL LOW (ref 13.0–17.0)
MCH: 34.5 pg — ABNORMAL HIGH (ref 26.0–34.0)
MCHC: 33.1 g/dL (ref 30.0–36.0)
MCV: 104.4 fL — ABNORMAL HIGH (ref 80.0–100.0)
Platelets: 454 10*3/uL — ABNORMAL HIGH (ref 150–400)
RBC: 3.39 MIL/uL — ABNORMAL LOW (ref 4.22–5.81)
RDW: 13.8 % (ref 11.5–15.5)
WBC: 11.6 10*3/uL — ABNORMAL HIGH (ref 4.0–10.5)
nRBC: 0 % (ref 0.0–0.2)

## 2022-12-15 LAB — BASIC METABOLIC PANEL
Anion gap: 11 (ref 5–15)
BUN: 19 mg/dL (ref 6–20)
CO2: 30 mmol/L (ref 22–32)
Calcium: 8.5 mg/dL — ABNORMAL LOW (ref 8.9–10.3)
Chloride: 101 mmol/L (ref 98–111)
Creatinine, Ser: 0.55 mg/dL — ABNORMAL LOW (ref 0.61–1.24)
GFR, Estimated: >60 mL/min (ref >60)
Glucose, Bld: 144 mg/dL — ABNORMAL HIGH (ref 70–99)
Potassium: 3.6 mmol/L (ref 3.5–5.1)
Sodium: 142 mmol/L (ref 135–145)

## 2022-12-15 LAB — TRIGLYCERIDES: Triglycerides: 99 mg/dL (ref <150)

## 2022-12-15 LAB — GLUCOSE, CAPILLARY
Glucose-Capillary: 144 mg/dL — ABNORMAL HIGH (ref 70–99)
Glucose-Capillary: 121 mg/dL — ABNORMAL HIGH (ref 70–99)
Glucose-Capillary: 115 mg/dL — ABNORMAL HIGH (ref 70–99)
Glucose-Capillary: 143 mg/dL — ABNORMAL HIGH (ref 70–99)
Glucose-Capillary: 109 mg/dL — ABNORMAL HIGH (ref 70–99)

## 2022-12-15 NOTE — Progress Notes (Signed)
Trauma/Critical Care Follow Up Note  Subjective:    Overnight Issues:   Objective:  Vital signs for last 24 hours: Temp:  [99 F (37.2 C)-100.3 F (37.9 C)] 99.1 F (37.3 C) (06/07 1125) Pulse Rate:  [63-87] 64 (06/07 1300) Resp:  [12-24] 14 (06/07 1300) BP: (108-149)/(66-83) 137/76 (06/07 1300) SpO2:  [92 %-100 %] 96 % (06/07 1300) FiO2 (%):  [40 %] 40 % (06/07 1125) Weight:  [914 kg] 108 kg (06/07 0702)  Hemodynamic parameters for last 24 hours:    Intake/Output from previous day: 06/06 0701 - 06/07 0700 In: 1902 [I.V.:610.3; NG/GT:991.7; IV Piggyback:300] Out: 525 [Urine:525]  Intake/Output this shift: Total I/O In: 775.4 [I.V.:155.3; NG/GT:420; IV Piggyback:200.1] Out: -   Vent settings for last 24 hours: Vent Mode: CPAP;PSV FiO2 (%):  [40 %] 40 % Set Rate:  [14 bmp] 14 bmp Vt Set:  [580 mL] 580 mL PEEP:  [5 cmH20] 5 cmH20 Pressure Support:  [8 cmH20] 8 cmH20 Plateau Pressure:  [12 cmH20-14 cmH20] 12 cmH20  Physical Exam:  Gen: comfortable, no distress Neuro: not following commands HEENT: PERRL Neck: supple CV: RRR Pulm: unlabored breathing on mechanical ventilation Abd: soft, NT    GU: urine clear and yellow, +spontaneous voids Extr: wwp, no edema  Results for orders placed or performed during the hospital encounter of 12/08/22 (from the past 24 hour(s))  Glucose, capillary     Status: Abnormal   Collection Time: 12/14/22  3:34 PM  Result Value Ref Range   Glucose-Capillary 127 (H) 70 - 99 mg/dL  Glucose, capillary     Status: Abnormal   Collection Time: 12/14/22  7:18 PM  Result Value Ref Range   Glucose-Capillary 126 (H) 70 - 99 mg/dL  Glucose, capillary     Status: Abnormal   Collection Time: 12/14/22 11:10 PM  Result Value Ref Range   Glucose-Capillary 127 (H) 70 - 99 mg/dL  Glucose, capillary     Status: Abnormal   Collection Time: 12/15/22  3:09 AM  Result Value Ref Range   Glucose-Capillary 144 (H) 70 - 99 mg/dL  Triglycerides      Status: None   Collection Time: 12/15/22  7:17 AM  Result Value Ref Range   Triglycerides 99 <150 mg/dL  Basic metabolic panel     Status: Abnormal   Collection Time: 12/15/22  7:17 AM  Result Value Ref Range   Sodium 142 135 - 145 mmol/L   Potassium 3.6 3.5 - 5.1 mmol/L   Chloride 101 98 - 111 mmol/L   CO2 30 22 - 32 mmol/L   Glucose, Bld 144 (H) 70 - 99 mg/dL   BUN 19 6 - 20 mg/dL   Creatinine, Ser 7.82 (L) 0.61 - 1.24 mg/dL   Calcium 8.5 (L) 8.9 - 10.3 mg/dL   GFR, Estimated >95 >62 mL/min   Anion gap 11 5 - 15  CBC     Status: Abnormal   Collection Time: 12/15/22  7:17 AM  Result Value Ref Range   WBC 11.6 (H) 4.0 - 10.5 K/uL   RBC 3.39 (L) 4.22 - 5.81 MIL/uL   Hemoglobin 11.7 (L) 13.0 - 17.0 g/dL   HCT 13.0 (L) 86.5 - 78.4 %   MCV 104.4 (H) 80.0 - 100.0 fL   MCH 34.5 (H) 26.0 - 34.0 pg   MCHC 33.1 30.0 - 36.0 g/dL   RDW 69.6 29.5 - 28.4 %   Platelets 454 (H) 150 - 400 K/uL   nRBC 0.0 0.0 -  0.2 %  Glucose, capillary     Status: Abnormal   Collection Time: 12/15/22 11:24 AM  Result Value Ref Range   Glucose-Capillary 121 (H) 70 - 99 mg/dL    Assessment & Plan: The plan of care was discussed with the bedside nurse for the day, who is in agreement with this plan and no additional concerns were raised.   Present on Admission:  TBI (traumatic brain injury) (HCC)    LOS: 7 days   Additional comments:I reviewed the patient's new clinical lab test results.   and I reviewed the patients new imaging test results.    S/p fall   TBI with SAH, SDH - NSGY c/s, Dr. Franky Macho, monitor neuro exam, keppra x7d for sz ppx. Repeat CT head 6/1 stable. Repeat CTA head/neck 6/5. EEG negative 6/5. Mildly improved neuro exam, but still not f/c. R temporal bone and parietal skull fx - NSGY c/s, Dr. Franky Macho, ENT recs audiogram Possible ICA dissection - four-vessel cerebral arteriogram negative except for occluded right internal jugular and right sigmoid sinus veins. ASA 81 per Dr.  Franky Macho. Right mastoid fx - ENT c/s, Dr. Jearld Fenton Fx thru spenhoids into carotid canals; vertical mastoid fx Alcohol intoxication - CIWA, TOC c/s Mild elevated transaminases - trend Suspected aspiration PNA - resp cx with H flu, CTX x5d FEN - NPO, TF DVT - SCDs, LMWH Dispo - ICU   Clinical update provided to mother at bedside.  Critical Care Total Time: 40 minutes  Diamantina Monks, MD Trauma & General Surgery Please use [email or web address] to contact on call provider  12/15/2022  *Care during the described time interval was provided by me. I have reviewed this patient's available data, including medical history, events of note, physical examination and test results as part of my evaluation.

## 2022-12-15 NOTE — Progress Notes (Signed)
Patient ID: Shawn Mills, male   DOB: 04-30-1981, 42 y.o.   MRN: [id number] BP (!) 148/66   Pulse 84   Temp 99.8 F (37.7 C) (Oral)   Resp 16   Ht 5\' 10"  (1.778 m)   Wt 108 kg   SpO2 95%   BMI 34.16 kg/m  Weaning on vent Perrl Moving extremities, left greater than right No changes

## 2022-12-15 NOTE — TOC CM/SW Note (Signed)
Transition of Care [facility]) - Inpatient Brief Assessment   Patient Details  Name: Shawn Mills MRN: [id number] Date of Birth: [birth year]  Transition of Care [facility]) CM/SW Contact:    Glennon Mac, RN Phone Number: 12/15/2022, 4:57 PM   Clinical Narrative: Patient admitted on 12/08/2022 after falling backwards from his back porch and striking his head.  He sustained TBI with SAH, SDH,, right temporal bone and parietal skull fracture, possible ICA dissection, and right mastoid fracture.  Patient currently remains sedated and on ventilator.  Will continue to follow as patient progresses; anticipate possible trach in near future.   Transition of Care Asessment: Insurance and Status: Insurance coverage has been reviewed Patient has primary care physician: No Home environment has been reviewed: From home; has supportive parents Prior level of function:: Independent PTA Prior/Current Home Services: No current home services Social Determinants of Health Reivew: SDOH reviewed no interventions necessary Readmission risk has been reviewed: Yes Transition of care needs: transition of care needs identified, TOC will continue to follow   Quintella Baton, RN, BSN  Trauma/Neuro ICU Case Manager [phone]

## 2022-12-16 LAB — BASIC METABOLIC PANEL
Anion gap: 9 (ref 5–15)
BUN: 18 mg/dL (ref 6–20)
CO2: 30 mmol/L (ref 22–32)
Calcium: 8.5 mg/dL — ABNORMAL LOW (ref 8.9–10.3)
Chloride: 101 mmol/L (ref 98–111)
Creatinine, Ser: 0.57 mg/dL — ABNORMAL LOW (ref 0.61–1.24)
GFR, Estimated: >60 mL/min (ref >60)
Glucose, Bld: 152 mg/dL — ABNORMAL HIGH (ref 70–99)
Potassium: 3.7 mmol/L (ref 3.5–5.1)
Sodium: 140 mmol/L (ref 135–145)

## 2022-12-16 LAB — GLUCOSE, CAPILLARY
Glucose-Capillary: 133 mg/dL — ABNORMAL HIGH (ref 70–99)
Glucose-Capillary: 131 mg/dL — ABNORMAL HIGH (ref 70–99)
Glucose-Capillary: 148 mg/dL — ABNORMAL HIGH (ref 70–99)
Glucose-Capillary: 151 mg/dL — ABNORMAL HIGH (ref 70–99)
Glucose-Capillary: 139 mg/dL — ABNORMAL HIGH (ref 70–99)
Glucose-Capillary: 144 mg/dL — ABNORMAL HIGH (ref 70–99)
Glucose-Capillary: 110 mg/dL — ABNORMAL HIGH (ref 70–99)

## 2022-12-16 LAB — CBC
HCT: 34.8 % — ABNORMAL LOW (ref 39.0–52.0)
Hemoglobin: 11.6 g/dL — ABNORMAL LOW (ref 13.0–17.0)
MCH: 35.4 pg — ABNORMAL HIGH (ref 26.0–34.0)
MCHC: 33.3 g/dL (ref 30.0–36.0)
MCV: 106.1 fL — ABNORMAL HIGH (ref 80.0–100.0)
Platelets: 482 10*3/uL — ABNORMAL HIGH (ref 150–400)
RBC: 3.28 MIL/uL — ABNORMAL LOW (ref 4.22–5.81)
RDW: 13.7 % (ref 11.5–15.5)
WBC: 11.5 10*3/uL — ABNORMAL HIGH (ref 4.0–10.5)
nRBC: 0 % (ref 0.0–0.2)

## 2022-12-16 MED ORDER — OXYCODONE HCL 5 MG/5ML PO SOLN
5.0000 mg | ORAL | Status: DC | PRN
  Administered 2022-12-16 (×2): 10 mg
  Administered 2022-12-16: 5 mg
  Administered 2022-12-17 – 2022-12-26 (×31): 10 mg
  Filled 2022-12-16 (×2): qty 10
  Filled 2022-12-16: qty 5
  Filled 2022-12-16 (×8): qty 10
  Filled 2022-12-16: qty 5
  Filled 2022-12-16 (×8): qty 10
  Filled 2022-12-16: qty 5
  Filled 2022-12-16 (×17): qty 10

## 2022-12-16 MED ORDER — BETHANECHOL CHLORIDE 25 MG PO TABS
25.0000 mg | ORAL_TABLET | Freq: Three times a day (TID) | ORAL | Status: DC
  Administered 2022-12-16 – 2022-12-18 (×8): 25 mg
  Filled 2022-12-16 (×9): qty 1

## 2022-12-16 NOTE — Progress Notes (Signed)
   Providing Compassionate, Quality Care - Together  NEUROSURGERY PROGRESS NOTE   S: No issues overnight.   O: EXAM:  BP 123/69   Pulse 65   Temp 99.4 F (37.4 C) (Oral)   Resp 11   Ht 5\' 10"  (1.778 m)   Wt 108 kg   SpO2 97%   BMI 34.16 kg/m   Intubated, sedated Eyes open to pain Positive cough/gag Purposeful left upper and lower extremity movement to pain Minimal withdrawal in the right upper and lower extremity to pain Pupils equally round reactive to light  ASSESSMENT:  42 y.o. male with   TBI, traumatic subarachnoid hemorrhage, subdural hematoma without significant mass effect  PLAN: -Recommend continue supportive care, no acute neurosurgical intervention. -TBI pathway    Thank you for allowing me to participate in this patient's care.  Please do not hesitate to call with questions or concerns.   Monia Pouch, DO Neurosurgeon [facility] Cell: [phone]

## 2022-12-16 NOTE — Progress Notes (Addendum)
Trauma/Critical Care Follow Up Note  Subjective:    Overnight Issues:  Required some up and down on the fentanyl.  Objective:  Vital signs for last 24 hours: Temp:  [99 F (37.2 C)-99.8 F (37.7 C)] 99.4 F (37.4 C) (06/08 0800) Pulse Rate:  [62-91] 65 (06/08 0755) Resp:  [11-25] 11 (06/08 0755) BP: (109-154)/(69-109) 123/69 (06/08 0755) SpO2:  [87 %-100 %] 97 % (06/08 0755) FiO2 (%):  [40 %-60 %] 40 % (06/08 0755) Weight:  [161 kg] 108 kg (06/08 0500)    Intake/Output from previous day: 06/07 0701 - 06/08 0700 In: 2666.3 [I.V.:670.7; NG/GT:1680; IV Piggyback:315.6] Out: 675 [Urine:675]  Intake/Output this shift: No intake/output data recorded.  Vent settings for last 24 hours: Vent Mode: CPAP;PSV FiO2 (%):  [40 %-60 %] 40 % Set Rate:  [14 bmp] 14 bmp Vt Set:  [580 mL] 580 mL PEEP:  [5 cmH20] 5 cmH20 Pressure Support:  [8 cmH20] 8 cmH20 Plateau Pressure:  [15 cmH20] 15 cmH20  Physical Exam:  Gen: comfortable, no distress Neuro: not following commands, occasional blinking (not to stimulus), occasional slight movement of left upper extremity.  Breathes more heavily with decreased fentanyl and propofol.   HEENT: PERRL Neck: supple CV: RRR Pulm: unlabored breathing on mechanical ventilation Abd: soft, NT    GU: urine clear and yellow, +spontaneous voids Extr: wwp, no edema  Results for orders placed or performed during the hospital encounter of 12/08/22 (from the past 24 hour(s))  Glucose, capillary     Status: Abnormal   Collection Time: 12/15/22 11:24 AM  Result Value Ref Range   Glucose-Capillary 121 (H) 70 - 99 mg/dL  Glucose, capillary     Status: Abnormal   Collection Time: 12/15/22  3:57 PM  Result Value Ref Range   Glucose-Capillary 115 (H) 70 - 99 mg/dL  Glucose, capillary     Status: Abnormal   Collection Time: 12/15/22  7:21 PM  Result Value Ref Range   Glucose-Capillary 143 (H) 70 - 99 mg/dL  Glucose, capillary     Status: Abnormal    Collection Time: 12/15/22 11:02 PM  Result Value Ref Range   Glucose-Capillary 109 (H) 70 - 99 mg/dL  Basic metabolic panel     Status: Abnormal   Collection Time: 12/16/22  1:43 AM  Result Value Ref Range   Sodium 140 135 - 145 mmol/L   Potassium 3.7 3.5 - 5.1 mmol/L   Chloride 101 98 - 111 mmol/L   CO2 30 22 - 32 mmol/L   Glucose, Bld 152 (H) 70 - 99 mg/dL   BUN 18 6 - 20 mg/dL   Creatinine, Ser 0.96 (L) 0.61 - 1.24 mg/dL   Calcium 8.5 (L) 8.9 - 10.3 mg/dL   GFR, Estimated >04 >54 mL/min   Anion gap 9 5 - 15  CBC     Status: Abnormal   Collection Time: 12/16/22  1:43 AM  Result Value Ref Range   WBC 11.5 (H) 4.0 - 10.5 K/uL   RBC 3.28 (L) 4.22 - 5.81 MIL/uL   Hemoglobin 11.6 (L) 13.0 - 17.0 g/dL   HCT 09.8 (L) 11.9 - 14.7 %   MCV 106.1 (H) 80.0 - 100.0 fL   MCH 35.4 (H) 26.0 - 34.0 pg   MCHC 33.3 30.0 - 36.0 g/dL   RDW 82.9 56.2 - 13.0 %   Platelets 482 (H) 150 - 400 K/uL   nRBC 0.0 0.0 - 0.2 %  Glucose, capillary     Status:  Abnormal   Collection Time: 12/16/22  3:16 AM  Result Value Ref Range   Glucose-Capillary 133 (H) 70 - 99 mg/dL  Glucose, capillary     Status: Abnormal   Collection Time: 12/16/22  7:38 AM  Result Value Ref Range   Glucose-Capillary 148 (H) 70 - 99 mg/dL  Glucose, capillary     Status: Abnormal   Collection Time: 12/16/22  8:14 AM  Result Value Ref Range   Glucose-Capillary 131 (H) 70 - 99 mg/dL    Assessment & Plan: The plan of care was discussed with the bedside nurse for the day, who is in agreement with this plan and no additional concerns were raised.   Present on Admission:  TBI (traumatic brain injury) (HCC)    LOS: 8 days   Additional comments:I reviewed the patient's new clinical lab test results.   and I reviewed the patients new imaging test results.    S/p fall   TBI with SAH, SDH - NSGY c/s, Dr. Franky Macho, monitor neuro exam, keppra x7d for sz ppx. Repeat CT head 6/1 stable. Repeat CTA head/neck 6/5. EEG negative 6/5. Mildly  improved neuro exam since arrival, but still not f/c or having any purposeful movement. No improvement in last 24 hours.   Sedation:  adding oxycodone elixir via tube to try to decrease fentanyl. May need seroquel. VDRF:  weaned down to PS.  Tentative trach/peg planned this week.  That may allow off propofol.  R temporal bone and parietal skull fx - NSGY c/s, Dr. Franky Macho, ENT recs audiogram Possible ICA dissection - four-vessel cerebral arteriogram negative except for occluded right internal jugular and right sigmoid sinus veins. ASA 81 per Dr. Franky Macho. Right mastoid fx - ENT c/s, Dr. Jearld Fenton Fx thru sphenoid bones into carotid canals; vertical mastoid fx Alcohol intoxication - CIWA, TOC c/s Mild elevated transaminases - trend, recheck in Am tomorrow Suspected aspiration PNA - resp cx 12/11/22  H flu, ceftriaxone x5d (completed 6/7) FEN - NPO, TF DVT - SCDs, LMWH Dispo - ICU    Critical Care Total Time: 35 minutes   Maudry Diego, MD, FACS, FSSO Surgical Oncology, General Surgery, Trauma and Critical [facility], [place] [phone] for weekday/non holidays Check [email or web address] for coverage night/weekend/holidays    12/16/2022  *Care during the described time interval was provided by me. I have reviewed this patient's available data, including medical history, events of note, physical examination and test results as part of my evaluation.

## 2022-12-17 ENCOUNTER — Inpatient Hospital Stay (HOSPITAL_COMMUNITY): Payer: Medicaid Other | Admitting: Certified Registered Nurse Anesthetist

## 2022-12-17 ENCOUNTER — Inpatient Hospital Stay (HOSPITAL_COMMUNITY): Payer: Medicaid Other

## 2022-12-17 DIAGNOSIS — S065X0A Traumatic subdural hemorrhage without loss of consciousness, initial encounter: Secondary | ICD-10-CM | POA: Diagnosis not present

## 2022-12-17 LAB — CBC
HCT: 34.7 % — ABNORMAL LOW (ref 39.0–52.0)
Hemoglobin: 11.3 g/dL — ABNORMAL LOW (ref 13.0–17.0)
MCH: 35.3 pg — ABNORMAL HIGH (ref 26.0–34.0)
MCHC: 32.6 g/dL (ref 30.0–36.0)
MCV: 108.4 fL — ABNORMAL HIGH (ref 80.0–100.0)
Platelets: 526 10*3/uL — ABNORMAL HIGH (ref 150–400)
RBC: 3.2 MIL/uL — ABNORMAL LOW (ref 4.22–5.81)
RDW: 13.7 % (ref 11.5–15.5)
WBC: 11.8 10*3/uL — ABNORMAL HIGH (ref 4.0–10.5)
nRBC: 0 % (ref 0.0–0.2)

## 2022-12-17 LAB — BASIC METABOLIC PANEL
Anion gap: 11 (ref 5–15)
BUN: 19 mg/dL (ref 6–20)
CO2: 31 mmol/L (ref 22–32)
Calcium: 9 mg/dL (ref 8.9–10.3)
Chloride: 102 mmol/L (ref 98–111)
Creatinine, Ser: 0.61 mg/dL (ref 0.61–1.24)
GFR, Estimated: >60 mL/min (ref >60)
Glucose, Bld: 129 mg/dL — ABNORMAL HIGH (ref 70–99)
Potassium: 3.9 mmol/L (ref 3.5–5.1)
Sodium: 144 mmol/L (ref 135–145)

## 2022-12-17 LAB — HEPATIC FUNCTION PANEL
ALT: 227 U/L — ABNORMAL HIGH (ref 0–44)
AST: 136 U/L — ABNORMAL HIGH (ref 15–41)
Albumin: 2.3 g/dL — ABNORMAL LOW (ref 3.5–5.0)
Alkaline Phosphatase: 134 U/L — ABNORMAL HIGH (ref 38–126)
Bilirubin, Direct: 0.1 mg/dL (ref 0.0–0.2)
Indirect Bilirubin: 0.3 mg/dL (ref 0.3–0.9)
Total Bilirubin: 0.4 mg/dL (ref 0.3–1.2)
Total Protein: 6.3 g/dL — ABNORMAL LOW (ref 6.5–8.1)

## 2022-12-17 LAB — GLUCOSE, CAPILLARY
Glucose-Capillary: 108 mg/dL — ABNORMAL HIGH (ref 70–99)
Glucose-Capillary: 143 mg/dL — ABNORMAL HIGH (ref 70–99)
Glucose-Capillary: 135 mg/dL — ABNORMAL HIGH (ref 70–99)
Glucose-Capillary: 111 mg/dL — ABNORMAL HIGH (ref 70–99)
Glucose-Capillary: 110 mg/dL — ABNORMAL HIGH (ref 70–99)
Glucose-Capillary: 133 mg/dL — ABNORMAL HIGH (ref 70–99)

## 2022-12-17 MED ORDER — ROCURONIUM BROMIDE 100 MG/10ML IV SOLN
INTRAVENOUS | Status: DC | PRN
  Administered 2022-12-17: 40 mg via INTRAVENOUS

## 2022-12-17 MED ORDER — PROPOFOL 10 MG/ML IV BOLUS
INTRAVENOUS | Status: DC | PRN
  Administered 2022-12-17: 80 mg via INTRAVENOUS

## 2022-12-17 NOTE — Progress Notes (Signed)
   Providing Compassionate, Quality Care - Together  NEUROSURGERY PROGRESS NOTE   S: No issues overnight.   O: EXAM:  BP 126/69   Pulse 78   Temp 99.5 F (37.5 C) (Oral)   Resp 12   Ht 5\' 10"  (1.778 m)   Wt 105.5 kg   SpO2 92%   BMI 33.37 kg/m   Intubated, sedated Eyes open to pain Positive cough/gag Purposeful left upper and lower extremity movement to pain Minimal withdrawal in the right upper and lower extremity to pain Pupils equally round reactive to light   ASSESSMENT:  42 y.o. male with    TBI, traumatic subarachnoid hemorrhage, subdural hematoma without significant mass effect   PLAN: -Recommend continue supportive care, no acute neurosurgical intervention. -TBI pathway    Thank you for allowing me to participate in this patient's care.  Please do not hesitate to call with questions or concerns.   Monia Pouch, DO Neurosurgeon [facility] Cell: [phone]

## 2022-12-17 NOTE — Transfer of Care (Signed)
Immediate Anesthesia Transfer of Care Note  Patient: Shawn Mills  Procedure(s) Performed: AN AD HOC INTUBATION  Patient Location: ICU  Anesthesia Type:General  Level of Consciousness: Patient remains intubated per anesthesia plan  Airway & Oxygen Therapy: Patient remains intubated per anesthesia plan and Patient placed on Ventilator (see vital sign flow sheet for setting)  Post-op Assessment: Report given to RN and Post -op Vital signs reviewed and stable  Post vital signs: Reviewed and stable  Last Vitals:  Vitals Value Taken Time  BP    Temp    Pulse 100 12/17/22 0421  Resp 18 12/17/22 0421  SpO2 88 % 12/17/22 0421  Vitals shown include unvalidated device data.  Last Pain:  Vitals:   12/17/22 0000  TempSrc: Oral  PainSc:          Complications: No notable events documented.

## 2022-12-17 NOTE — Progress Notes (Addendum)
Patients ETT cuff blown.MD White notified, then CRNA Roshi notified. CRNA arrived for tube exchange, 60 mg of prop bolus and 40 mg of rocuronium given by CRNA Roshi, to do tube exchange. Chest xray ordered.

## 2022-12-17 NOTE — Progress Notes (Signed)
Trauma/Critical Care Follow Up Note  Subjective:    Overnight Issues:  Required reintubation due to blown ETT cuff.  Non eventful reintubation.  No other events.  Repeat CXR showed good tube position, but some decreased lung volumes.    Objective:  Vital signs for last 24 hours: Temp:  [99 F (37.2 C)-99.8 F (37.7 C)] 99.5 F (37.5 C) (06/09 0803) Pulse Rate:  [65-89] 72 (06/09 0800) Resp:  [11-25] 24 (06/09 0800) BP: (111-139)/(64-89) 123/70 (06/09 0800) SpO2:  [92 %-99 %] 92 % (06/09 0800) FiO2 (%):  [40 %] 40 % (06/09 0700) Weight:  [105.5 kg] 105.5 kg (06/09 0500)    Intake/Output from previous day: 06/08 0701 - 06/09 0700 In: 2210 [I.V.:600; NG/GT:1610] Out: 1700 [Urine:1700]  Intake/Output this shift: Total I/O In: 187.6 [I.V.:47.6; NG/GT:140] Out: -   Vent settings for last 24 hours: Vent Mode: PSV;CPAP FiO2 (%):  [40 %] 40 % Set Rate:  [14 bmp] 14 bmp Vt Set:  [580 mL] 580 mL PEEP:  [5 cmH20] 5 cmH20 Pressure Support:  [8 cmH20] 8 cmH20 Plateau Pressure:  [17 cmH20-20 cmH20] 20 cmH20  Physical Exam:  Gen: comfortable, no distress Neuro: not following commands, slightly more purposeful.  Pulled at pillow per RN.  Opens eyes with auscultation of chest.   Some spontaneous movement of left arm.   HEENT: PERRL Neck: supple CV: RRR Pulm: unlabored breathing on mechanical ventilation Abd: soft, NT ND  GU: urine clear and yellow, +spontaneous voids Extr: wwp, no edema  Results for orders placed or performed during the hospital encounter of 12/08/22 (from the past 24 hour(s))  Glucose, capillary     Status: Abnormal   Collection Time: 12/16/22 11:42 AM  Result Value Ref Range   Glucose-Capillary 151 (H) 70 - 99 mg/dL  Glucose, capillary     Status: Abnormal   Collection Time: 12/16/22  3:07 PM  Result Value Ref Range   Glucose-Capillary 139 (H) 70 - 99 mg/dL  Glucose, capillary     Status: Abnormal   Collection Time: 12/16/22  7:21 PM  Result Value Ref  Range   Glucose-Capillary 144 (H) 70 - 99 mg/dL  Glucose, capillary     Status: Abnormal   Collection Time: 12/16/22 11:19 PM  Result Value Ref Range   Glucose-Capillary 110 (H) 70 - 99 mg/dL  Basic metabolic panel     Status: Abnormal   Collection Time: 12/17/22  2:27 AM  Result Value Ref Range   Sodium 144 135 - 145 mmol/L   Potassium 3.9 3.5 - 5.1 mmol/L   Chloride 102 98 - 111 mmol/L   CO2 31 22 - 32 mmol/L   Glucose, Bld 129 (H) 70 - 99 mg/dL   BUN 19 6 - 20 mg/dL   Creatinine, Ser 5.78 0.61 - 1.24 mg/dL   Calcium 9.0 8.9 - 46.9 mg/dL   GFR, Estimated >62 >95 mL/min   Anion gap 11 5 - 15  CBC     Status: Abnormal   Collection Time: 12/17/22  2:27 AM  Result Value Ref Range   WBC 11.8 (H) 4.0 - 10.5 K/uL   RBC 3.20 (L) 4.22 - 5.81 MIL/uL   Hemoglobin 11.3 (L) 13.0 - 17.0 g/dL   HCT 28.4 (L) 13.2 - 44.0 %   MCV 108.4 (H) 80.0 - 100.0 fL   MCH 35.3 (H) 26.0 - 34.0 pg   MCHC 32.6 30.0 - 36.0 g/dL   RDW 10.2 72.5 - 36.6 %  Platelets 526 (H) 150 - 400 K/uL   nRBC 0.0 0.0 - 0.2 %  Hepatic function panel     Status: Abnormal   Collection Time: 12/17/22  2:27 AM  Result Value Ref Range   Total Protein 6.3 (L) 6.5 - 8.1 g/dL   Albumin 2.3 (L) 3.5 - 5.0 g/dL   AST 409 (H) 15 - 41 U/L   ALT 227 (H) 0 - 44 U/L   Alkaline Phosphatase 134 (H) 38 - 126 U/L   Total Bilirubin 0.4 0.3 - 1.2 mg/dL   Bilirubin, Direct 0.1 0.0 - 0.2 mg/dL   Indirect Bilirubin 0.3 0.3 - 0.9 mg/dL  Glucose, capillary     Status: Abnormal   Collection Time: 12/17/22  3:35 AM  Result Value Ref Range   Glucose-Capillary 108 (H) 70 - 99 mg/dL  Glucose, capillary     Status: Abnormal   Collection Time: 12/17/22  7:35 AM  Result Value Ref Range   Glucose-Capillary 143 (H) 70 - 99 mg/dL    Assessment & Plan: The plan of care was discussed with the bedside nurse for the day, who is in agreement with this plan and no additional concerns were raised.   Present on Admission:  TBI (traumatic brain injury)  (HCC)    LOS: 9 days   Additional comments:I reviewed the patient's new clinical lab test results.   and I reviewed the patients new imaging test results.    S/p fall   TBI with SAH, SDH - NSGY c/s, Dr. Franky Macho, monitor neuro exam, keppra x7d for sz ppx. Repeat CT head 6/1 stable. Repeat CTA head/neck 6/5. EEG negative 6/5. Mildly improved neuro exam since arrival, but still not f/c or having any purposeful movement. Trace improvement since yesterday.  Sedation:  Oxycodone elixir via tube.  Fentanyl down a bit as well as propofol.  Work on slow wean.  Seems to get wild with stopping meds and then ends up on higher doses of gtts.  Slow drop seems to be working better for him.   VDRF:  weaned down to PS.  Tentative trach/peg planned this week.  That may allow off propofol.  R temporal bone/mastoid fx, parietal skull fx - NSGY c/s, Dr. Franky Macho, ENT Jearld Fenton) recs audiogram once extubated. No operative intervention required at this point.  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 ASA 81 per Dr. Franky Macho. Fx thru sphenoid bones into carotid canals; vertical mastoid fx Alcohol intoxication - CIWA, TOC c/s Mild elevated transaminases - trend, transaminases higher along with alk phos.  Repeat later this week.  Suspected aspiration PNA, resolved- resp cx 12/11/22  H flu, ceftriaxone x5d (completed 6/7) FEN - NPO, TF DVT - SCDs, LMWH Dispo - ICU    Critical Care Total Time: 30 minutes   Maudry Diego, MD, FACS, FSSO Surgical Oncology, General Surgery, Trauma and [facility], [place] [phone] for weekday/non holidays Check [email or web address] for coverage night/weekend/holidays    12/17/2022  *Care during the described time interval was provided by me. I have reviewed this patient's available data, including medical history, events of note, physical examination and test results as  part of my evaluation.

## 2022-12-17 NOTE — Anesthesia Procedure Notes (Signed)
Procedure Name: Intubation Date/Time: 12/17/2022 4:04 AM  Performed by: Tifanny Dollens T, CRNAPre-anesthesia Checklist: Patient identified, Emergency Drugs available, Suction available and Patient being monitored Patient Re-evaluated:Patient Re-evaluated prior to induction Oxygen Delivery Method: Ambu bag Preoxygenation: Pre-oxygenation with 100% oxygen Induction Type: IV induction Ventilation: Mask ventilation without difficulty Laryngoscope Size: Glidescope and 4 Grade View: Grade I Tube type: Oral Tube size: 7.5 mm Number of attempts: 1 Airway Equipment and Method: Stylet, Oral airway, Video-laryngoscopy and Bougie stylet Placement Confirmation: ETT inserted through vocal cords under direct vision, positive ETCO2 and breath sounds checked- equal and bilateral Secured at: 23 cm Tube secured with: Tape Dental Injury: Teeth and Oropharynx as per pre-operative assessment

## 2022-12-17 NOTE — Progress Notes (Signed)
RT called to room because patient had cuff leak.  RT added air to the cuff and the patient continued to have a cuff leak.  ETT was secured at 23 cm; advanced 1 cm and added air to the cuff and balloon would not hold any pressure.  RT advised RN that the cuff was blown and the RN notified the MD.

## 2022-12-18 LAB — GLUCOSE, CAPILLARY
Glucose-Capillary: 125 mg/dL — ABNORMAL HIGH (ref 70–99)
Glucose-Capillary: 108 mg/dL — ABNORMAL HIGH (ref 70–99)
Glucose-Capillary: 116 mg/dL — ABNORMAL HIGH (ref 70–99)
Glucose-Capillary: 137 mg/dL — ABNORMAL HIGH (ref 70–99)
Glucose-Capillary: 136 mg/dL — ABNORMAL HIGH (ref 70–99)
Glucose-Capillary: 132 mg/dL — ABNORMAL HIGH (ref 70–99)

## 2022-12-18 LAB — TRIGLYCERIDES: Triglycerides: 89 mg/dL (ref <150)

## 2022-12-18 MED ORDER — FENTANYL CITRATE PF 50 MCG/ML IJ SOSY
50.0000 ug | PREFILLED_SYRINGE | INTRAMUSCULAR | Status: DC | PRN
  Administered 2022-12-18 – 2022-12-25 (×14): 50 ug via INTRAVENOUS
  Filled 2022-12-18 (×14): qty 1

## 2022-12-18 NOTE — Procedures (Signed)
Extubation Procedure Note  Patient Details:   Name: Shawn Mills DOB: 1981/04/28 MRN: [id number]   Airway Documentation:    Vent end date: 12/18/22 Vent end time: 0915   Evaluation  O2 sats: stable throughout Complications: No apparent complications Patient did tolerate procedure well. Bilateral Breath Sounds: Diminished, Rhonchi   Yes  Pt was extubated per Md order and placed on 3 L [place]. Cuff leak was noted prior to extubation and no stridor post. Pt is stable at this time. Rt will monitor.   Merlene Laughter 12/18/2022, 9:17 AM

## 2022-12-18 NOTE — Progress Notes (Signed)
SLP Cancellation Note  Patient Details Name: Shawn Mills MRN: [id number] DOB: 1981-03-01   Cancelled treatment:       Reason Eval/Treat Not Completed: Medical issues which prohibited therapy. Extubated today, will give more time to tolerate extubation prior to attempting assessment. Appears to now be on HHFNC.   Harlon Ditty, MA CCC-SLP  Acute Rehabilitation Services Secure Chat Preferred Office [phone]  Claudine Mouton 12/18/2022, 12:42 PM

## 2022-12-18 NOTE — Progress Notes (Signed)
Patient ID: Shawn Mills, male   DOB: 08-Apr-1981, 42 y.o.   MRN: [id number] BP (!) 162/100   Pulse (!) 104   Temp 99.9 F (37.7 C) (Axillary)   Resp (!) 24   Ht 5\' 10"  (1.778 m)   Wt 105.5 kg   SpO2 96%   BMI 33.37 kg/m  Alert, not following commands Did whisper some words tonight Right facial droop Moving left side >>>right side Perrl, full eom Tracking Extubated, believe improvement will come, but will be very slow.

## 2022-12-18 NOTE — Progress Notes (Signed)
Nutrition Follow-up  DOCUMENTATION CODES:   Not applicable  INTERVENTION:  Continue TF's at goal rate via NG Pivot 1.5 @ 70 ml/hr   NUTRITION DIAGNOSIS:   Increased nutrient needs related to  (TBI) as evidenced by estimated needs. -ongoing   GOAL:   Patient will meet greater than or equal to 90% of their needs - currently being met with TF's   MONITOR:   TF tolerance  REASON FOR ASSESSMENT:   Consult Enteral/tube feeding initiation and management  ASSESSMENT:   Pt admitted after fall with TBI with SAH, SDH, R temporal bone fx, R parietal fx, possible ICA dissection, R mastoid fx, fx through spenhoids, vertical mastoid fx.  Visited patient at bedside who is extubated and tolerating TF's at goal  FMS in place. RN reports no issues and that his bowel movements started yesterday and have slowed down today. Will continue to monitor output   No plans for po diet at this time    Labs:  Glu 129, alk phos 134, AST 136, ALT 227 Meds: colace, lovenox, folvite, MVI, miralax, thiamine  Wt: admit wt 213#, CBW 232.5#  I/O's: +10.3 L    Diet Order:   Diet Order             Diet NPO time specified  Diet effective now                   EDUCATION NEEDS:   Not appropriate for education at this time  Skin:  Skin Assessment: Reviewed RN Assessment  Last BM:  6/10 type 7 via FMS  Height:   Ht Readings from Last 1 Encounters:  12/13/22 5\' 10"  (1.778 m)    Weight:   Wt Readings from Last 1 Encounters:  12/18/22 105.5 kg    Ideal Body Weight:     BMI:  Body mass index is 33.37 kg/m.  Estimated Nutritional Needs:   Kcal:  2400-2600  Protein:  125-145 grams  Fluid:  >2L/day   Leodis Rains, RDN, LDN  Clinical Nutrition

## 2022-12-18 NOTE — Progress Notes (Signed)
Patient ID: Shawn Mills, male   DOB: 1980/07/23, 42 y.o.   MRN: [id number] Follow up - Trauma Critical Care   Patient Details:    Shawn Mills is an 42 y.o. male.  Lines/tubes : Airway 7.5 mm (Active)  Secured at (cm) 25 cm 12/18/22 0350  Measured From Lips 12/18/22 0350  Secured Location Left 12/18/22 0350  Secured By [employer] 12/18/22 0350  Tube Holder Repositioned Yes 12/18/22 0350  Prone position No 12/18/22 0350  Cuff Pressure (cm H2O) Green OR 18-26 [facility] 12/17/22 1946  Site Condition Dry 12/18/22 0350     NG/OG Vented/Dual Lumen Left nare Marking at nare/corner of mouth 65 cm (Active)  Tube Position (Required) Marking at nare/corner of mouth 12/18/22 0735  Measurement (cm) (Required) 65 cm 12/18/22 0735  Ongoing Placement Verification (Required) (See row information) Yes 12/18/22 0735  Site Assessment Clean, Dry, Intact 12/18/22 0735  Interventions Irrigated 12/18/22 0735  Status Feeding 12/18/22 0735     Flatus Tube/Pouch (Active)  Daily care Skin around tube assessed;Skin barrier applied to rectal area;Assess location of position indicator line;Flushed tube with 30mL water (document as intake);Bag changed (every 24 hours) 12/18/22 0735  Output (mL) 0 mL 12/18/22 0500     Urethral Catheter Lequita Halt D Double-lumen 16 Fr. (Active)  Indication for Insertion or Continuance of Catheter Acute urinary retention (I&O Cath for 24 hrs prior to catheter insertion- Inpatient Only) 12/18/22 0735  Site Assessment Clean, Dry, Intact 12/18/22 0735  Catheter Maintenance Bag below level of bladder;Catheter secured;Drainage bag/tubing not touching floor;Insertion date on drainage bag;No dependent loops;Seal intact 12/18/22 0735  Collection Container Standard drainage bag 12/18/22 0735  Securement Method Securing device (Describe) 12/18/22 0735  Urinary Catheter Interventions (if applicable) Unclamped 12/18/22 0735  Output (mL) 175 mL 12/18/22 0700    Microbiology/Sepsis  markers: Results for orders placed or performed during the hospital encounter of 12/08/22  MRSA Next Gen by PCR, Nasal     Status: None   Collection Time: 12/09/22  2:42 AM   Specimen: Nasal Mucosa; Nasal Swab  Result Value Ref Range Status   MRSA by PCR Next Gen NOT DETECTED NOT DETECTED Final    Comment: (NOTE) The GeneXpert MRSA Assay (FDA approved for NASAL specimens only), is one component of a comprehensive MRSA colonization surveillance program. It is not intended to diagnose MRSA infection nor to guide or monitor treatment for MRSA infections. Test performance is not FDA approved in patients less than 25 years old. Performed at [facility], [address] [place], [place] [address]   Culture, Respiratory w Gram Stain     Status: None   Collection Time: 12/11/22 10:24 AM   Specimen: Tracheal Aspirate; Respiratory  Result Value Ref Range Status   Specimen Description TRACHEAL ASPIRATE  Final   Special Requests NONE  Final   Gram Stain   Final    ABUNDANT WBC PRESENT, PREDOMINANTLY PMN RARE SQUAMOUS EPITHELIAL CELLS PRESENT FEW GRAM POSITIVE COCCI MODERATE GRAM NEGATIVE RODS FEW GRAM POSITIVE RODS    Culture   Final    ABUNDANT HAEMOPHILUS INFLUENZAE BETA LACTAMASE NEGATIVE Performed at [facility], [address] [place], [place] [address]    Report Status 12/12/2022 FINAL  Final    Anti-infectives:  Anti-infectives (From admission, onward)    Start     Dose/Rate Route Frequency Ordered Stop   12/11/22 1115  cefTRIAXone (ROCEPHIN) 2 g in sodium chloride 0.9 % 100 mL IVPB  2 g 200 mL/hr over 30 Minutes Intravenous Every 24 hours 12/11/22 1020 12/15/22 1117      Consults: Treatment Team:  Md, Trauma, MD Coletta Memos, MD    Studies:    Events:  Subjective:    Overnight Issues: stable  Objective:  Vital signs for last 24 hours: Temp:  [99 F (37.2 C)-100.4 F (38 C)] 99.5 F (37.5 C) (06/10 0730) Pulse Rate:  [70-98] 98  (06/10 0730) Resp:  [12-21] 17 (06/10 0730) BP: (108-145)/(59-97) 121/97 (06/10 0730) SpO2:  [92 %-97 %] 94 % (06/10 0730) FiO2 (%):  [40 %] 40 % (06/10 0350) Weight:  [105.5 kg] 105.5 kg (06/10 0251)  Hemodynamic parameters for last 24 hours:    Intake/Output from previous day: 06/09 0701 - 06/10 0700 In: 2446.8 [I.V.:696.8; NG/GT:1750] Out: 1650 [Urine:1650]  Intake/Output this shift: No intake/output data recorded.  Vent settings for last 24 hours: Vent Mode: PRVC FiO2 (%):  [40 %] 40 % Set Rate:  [14 bmp] 14 bmp Vt Set:  [580 mL] 580 mL PEEP:  [5 cmH20] 5 cmH20 Pressure Support:  [8 cmH20] 8 cmH20 Plateau Pressure:  [15 cmH20-20 cmH20] 16 cmH20  Physical Exam:  General: on vent Neuro: looks to voice, F/C LUE LLE HEENT/Neck: ETT Resp: clear to auscultation bilaterally CVS: RRR GI: soft, NT Extremities: calves soft  Results for orders placed or performed during the hospital encounter of 12/08/22 (from the past 24 hour(s))  Glucose, capillary     Status: Abnormal   Collection Time: 12/17/22 11:33 AM  Result Value Ref Range   Glucose-Capillary 135 (H) 70 - 99 mg/dL  Glucose, capillary     Status: Abnormal   Collection Time: 12/17/22  3:29 PM  Result Value Ref Range   Glucose-Capillary 111 (H) 70 - 99 mg/dL  Glucose, capillary     Status: Abnormal   Collection Time: 12/17/22  7:17 PM  Result Value Ref Range   Glucose-Capillary 110 (H) 70 - 99 mg/dL  Glucose, capillary     Status: Abnormal   Collection Time: 12/17/22 11:14 PM  Result Value Ref Range   Glucose-Capillary 133 (H) 70 - 99 mg/dL  Glucose, capillary     Status: Abnormal   Collection Time: 12/18/22  3:03 AM  Result Value Ref Range   Glucose-Capillary 125 (H) 70 - 99 mg/dL  Triglycerides     Status: None   Collection Time: 12/18/22  6:28 AM  Result Value Ref Range   Triglycerides 89 <150 mg/dL  Glucose, capillary     Status: Abnormal   Collection Time: 12/18/22  7:29 AM  Result Value Ref Range    Glucose-Capillary 108 (H) 70 - 99 mg/dL    Assessment & Plan: Present on Admission:  TBI (traumatic brain injury) (HCC)    LOS: 10 days   Additional comments:I reviewed the patient's new clinical lab test results.   S/p fall   TBI with SAH, SDH - NSGY c/s, Dr. Franky Macho, monitor neuro exam, keppra x7d for sz ppx. Repeat CT head 6/1 stable. Repeat CTA head/neck 6/5. EEG negative 6/5. Neuro exam improving, now F/C Sedation:  Oxycodone via tube.  Fentanyl down a bit as well as propofol.  Work on slow wean.   Acute hypoxic ventilator dependent respiratory failure:  weaning, now F/C so may be able to extubate instead of trach/PEG R temporal bone/mastoid fx, parietal skull fx - NSGY c/s, Dr. Franky Macho, ENT Jearld Fenton) recs audiogram once extubated. No operative intervention required at this point.  Possible  ICA dissection - four-vessel cerebral arteriogram negative except for occluded right internal jugular and right sigmoid sinus veins. ASA 81 per Dr. Franky Macho. Fx thru sphenoid bones into carotid canals; vertical mastoid fx Alcohol intoxication - CIWA, TOC c/s Mild elevated transaminases - trend, transaminases higher along with alk phos.  Repeat later this week.  Suspected aspiration PNA, resolved- resp cx 12/11/22  H flu, ceftriaxone x5d (completed 6/7) FEN - NPO, TF DVT - SCDs, LMWH Dispo - ICU, vent wean Critical Care Total Time*: 35 Minutes  Violeta Gelinas, MD, MPH, FACS Trauma & General Surgery Use [email or web address] to contact on call provider  12/18/2022  *Care during the described time interval was provided by me. I have reviewed this patient's available data, including medical history, events of note, physical examination and test results as part of my evaluation.

## 2022-12-18 NOTE — Progress Notes (Signed)
Patient ID: Shawn Mills, male   DOB: 1981/03/10, 42 y.o.   MRN: [id number] Pulling 900 TV on 5/5. Awake, Follows some commands. Will extubate. Violeta Gelinas, MD, MPH, FACS Please use [email or web address] to contact on call provider

## 2022-12-19 ENCOUNTER — Inpatient Hospital Stay (HOSPITAL_COMMUNITY): Payer: Medicaid Other

## 2022-12-19 LAB — BASIC METABOLIC PANEL
Anion gap: 9 (ref 5–15)
BUN: 17 mg/dL (ref 6–20)
CO2: 27 mmol/L (ref 22–32)
Calcium: 8.7 mg/dL — ABNORMAL LOW (ref 8.9–10.3)
Chloride: 105 mmol/L (ref 98–111)
Creatinine, Ser: 0.5 mg/dL — ABNORMAL LOW (ref 0.61–1.24)
GFR, Estimated: >60 mL/min (ref >60)
Glucose, Bld: 126 mg/dL — ABNORMAL HIGH (ref 70–99)
Potassium: 3.9 mmol/L (ref 3.5–5.1)
Sodium: 141 mmol/L (ref 135–145)

## 2022-12-19 LAB — CBC
HCT: 33.9 % — ABNORMAL LOW (ref 39.0–52.0)
Hemoglobin: 11.3 g/dL — ABNORMAL LOW (ref 13.0–17.0)
MCH: 34.2 pg — ABNORMAL HIGH (ref 26.0–34.0)
MCHC: 33.3 g/dL (ref 30.0–36.0)
MCV: 102.7 fL — ABNORMAL HIGH (ref 80.0–100.0)
Platelets: 621 10*3/uL — ABNORMAL HIGH (ref 150–400)
RBC: 3.3 MIL/uL — ABNORMAL LOW (ref 4.22–5.81)
RDW: 13.6 % (ref 11.5–15.5)
WBC: 18.2 10*3/uL — ABNORMAL HIGH (ref 4.0–10.5)
nRBC: 0 % (ref 0.0–0.2)

## 2022-12-19 LAB — GLUCOSE, CAPILLARY
Glucose-Capillary: 122 mg/dL — ABNORMAL HIGH (ref 70–99)
Glucose-Capillary: 110 mg/dL — ABNORMAL HIGH (ref 70–99)
Glucose-Capillary: 89 mg/dL (ref 70–99)
Glucose-Capillary: 103 mg/dL — ABNORMAL HIGH (ref 70–99)
Glucose-Capillary: 117 mg/dL — ABNORMAL HIGH (ref 70–99)

## 2022-12-19 MED ORDER — LORAZEPAM 2 MG/ML IJ SOLN
1.0000 mg | INTRAMUSCULAR | Status: DC | PRN
  Administered 2022-12-19 – 2022-12-23 (×11): 1 mg via INTRAVENOUS
  Filled 2022-12-19 (×11): qty 1

## 2022-12-19 MED ORDER — QUETIAPINE FUMARATE 50 MG PO TABS
50.0000 mg | ORAL_TABLET | Freq: Two times a day (BID) | ORAL | Status: DC
  Administered 2022-12-19 – 2022-12-25 (×14): 50 mg
  Filled 2022-12-19 (×6): qty 1
  Filled 2022-12-19: qty 2
  Filled 2022-12-19: qty 1
  Filled 2022-12-19: qty 2
  Filled 2022-12-19 (×7): qty 1

## 2022-12-19 NOTE — Progress Notes (Signed)
Patient ID: Shawn Mills, male   DOB: 03-23-1981, 42 y.o.   MRN: [id number] BP (!) 147/94   Pulse (!) 107   Temp 99.6 F (37.6 C) (Axillary)   Resp (!) 21   Ht 5\' 10"  (1.778 m)   Wt 106.2 kg   SpO2 100%   BMI 33.59 kg/m  No neurological changes. Moving all extremities Perrl, tracks, full eom Doing well off ventilator.  Continue to monitor

## 2022-12-19 NOTE — Evaluation (Signed)
Speech Language Pathology Evaluation Patient Details Name: Shawn Mills MRN: [id number] DOB: 1981-04-23 Today's Date: 12/19/2022 Time: 5643-3295 SLP Time Calculation (min) (ACUTE ONLY): 24 min  Problem List:  Patient Active Problem List   Diagnosis Date Noted   TBI (traumatic brain injury) (HCC) 12/08/2022   Past Medical History: No past medical history on file.  HPI:  42 yo male s/p fall TBI with SAH, SDH, R temporal bone, vertical mastoid fx, parietal skull fx intubated 5/31- extubated 6/10 possible ICA dissection, fx sphenoid bones into carotid canals CIWA ETOH + PMH: none   Assessment / Plan / Recommendation Clinical Impression  Pt demonstrates cognitive impiarment following TBI; behavior consistent with a Rancho III (localized) with emerging purposeful interactions when maximally alert. Breath support for speech low and pt dysphonic after 10 day intubation. Arousal is intermittent, gaze is only briefly focused to speaker or activity, primarily on the left. Pt did reach for tooth brush and brushed his own teeth with minimal effort. Occasionaly responded to questions though not intelligible. No response to Y/N. Attended to masticating ice. Will f/u for further swallowing assessment as appropriate.    SLP Assessment  SLP Recommendation/Assessment: Patient needs continued Speech Lanaguage Pathology Services SLP Visit Diagnosis: Cognitive communication deficit (R41.841)    Recommendations for follow up therapy are one component of a multi-disciplinary discharge planning process, led by the attending physician.  Recommendations may be updated based on patient status, additional functional criteria and insurance authorization.    Follow Up Recommendations    CIR   Assistance Recommended at Discharge   Full  Functional Status Assessment    Frequency and Duration min 2x/week  2 weeks      SLP Evaluation Cognition  Overall Cognitive Status: Impaired/Different from  baseline Arousal/Alertness: Lethargic Orientation Level: Disoriented X4 Attention: Focused Focused Attention: Impaired Focused Attention Impairment: Verbal basic;Functional basic Rancho Mirant Scales of Cognitive Functioning: Localized Response       Comprehension  Auditory Comprehension Overall Auditory Comprehension: Impaired Yes/No Questions: Impaired Basic Biographical Questions: 0-25% accurate Commands: Impaired One Step Basic Commands: 0-24% accurate Conversation: Simple    Expression Verbal Expression Overall Verbal Expression: Impaired Repetition: Impaired Level of Impairment: Word level Written Expression Dominant Hand: Right   Oral / Motor  Oral Motor/Sensory Function Overall Oral Motor/Sensory Function: Generalized oral weakness Motor Speech Overall Motor Speech: Impaired Phonation: Low vocal intensity;Breathy            Sarahelizabeth Conway, Riley Nearing 12/19/2022, 12:38 PM

## 2022-12-19 NOTE — Evaluation (Signed)
Occupational Therapy Evaluation Patient Details Name: Shawn Mills MRN: [id number] DOB: 1980-09-19 Today's Date: 12/19/2022   History of Present Illness 42 yo male s/p fall TBI with SAH, SDH, R temporal bone, vertical mastoid fx, parietal skull fx intubated 5/31- extubated 6/10 possible ICA dissection, fx sphenoid bones into carotid canals CIWA ETOH + PMH: none   Clinical Impression   PT admitted with TBI with multiple facial injuries. Pt currently with functional limitiations due to the deficits listed below (see OT problem list). Pt at baseline is independent and works in [employer]. He lives with his fiance in a ground level apartment with a great dane mix dog. Pt presents as Rancho Coma III ( localized) with no R UE activation noted. Pt tolerated EOB sitting for 10 minutes with attempts at oral care/ SLP swallow eval.  Pt will benefit from skilled OT to increase their independence and safety with adls and balance to allow discharge Patient will benefit from intensive inpatient follow up therapy, >3 hours/day .    Enjoys working on cars, snoopy, big foot , music ( [employer], [address] [place], oldies)   Recommendations for follow up therapy are one component of a multi-disciplinary discharge planning process, led by the attending physician.  Recommendations may be updated based on patient status, additional functional criteria and insurance authorization.   Assistance Recommended at Discharge Frequent or constant Supervision/Assistance  Patient can return home with the following Two people to help with bathing/dressing/bathroom    Functional Status Assessment  Patient has had a recent decline in their functional status and demonstrates the ability to make significant improvements in function in a reasonable and predictable amount of time.  Equipment Recommendations  Wheelchair (measurements OT);Wheelchair cushion (measurements OT);Hospital bed;Other (comment) (hoyer lift)     Recommendations for Other Services Rehab consult     Precautions / Restrictions Precautions Precautions: Fall Precaution Comments: Mittens, bil wrist restraints, flexiseal, foley Restrictions Weight Bearing Restrictions: No      Mobility Bed Mobility Overal bed mobility: Needs Assistance Bed Mobility: Rolling, Supine to Sit, Sit to Supine Rolling: +2 for safety/equipment, +2 for physical assistance, Max assist   Supine to sit: +2 for physical assistance, HOB elevated, +2 for safety/equipment, Max assist Sit to supine: Total assist, +2 for physical assistance, +2 for safety/equipment, HOB elevated   General bed mobility comments: pt requires (A ) to helicopter turn toward EOB and static sit eob. pt total (A) at EOB. pt initiates an anterior lean once during session. pt pushing with L UE at EOB toward R side.    Transfers                   General transfer comment: not tested at this time.      Balance Overall balance assessment: Needs assistance Sitting-balance support: Single extremity supported, Feet supported Sitting balance-Leahy Scale: Zero                                     ADL either performed or assessed with clinical judgement   ADL Overall ADL's : Needs assistance/impaired Eating/Feeding: NPO Eating/Feeding Details (indicate cue type and reason): required ice chip placed in mouth, does not open mouth or take it off the spoon with SLp  General ADL Comments: total (A) for all adls     Vision Baseline Vision/History: 0 No visual deficits Vision Assessment?: Vision impaired- to be further tested in functional context Additional Comments: difficult to fully assess with decreased R gaze compared to central and L gaze.     Perception     Praxis      Pertinent Vitals/Pain Pain Assessment Pain Assessment: PAINAD Breathing: normal Negative Vocalization: occasional moan/groan, low speech,  negative/disapproving quality Facial Expression: smiling or inexpressive Body Language: relaxed Consolability: no need to console PAINAD Score: 1     Hand Dominance Right   Extremity/Trunk Assessment Upper Extremity Assessment Upper Extremity Assessment: RUE deficits/detail;LUE deficits/detail RUE Deficits / Details: no active movement noted during session. RUE Coordination: decreased fine motor;decreased gross motor LUE Deficits / Details: reaching for face with L UE, undershooting and loose grasp on tooth brush attachment to suction LUE Coordination: decreased fine motor;decreased gross motor   Lower Extremity Assessment Lower Extremity Assessment: Defer to PT evaluation   Cervical / Trunk Assessment Cervical / Trunk Assessment: Normal;Other exceptions (noted to have rash on back)   Communication Communication Communication: Other (comment) (making sounds during session. fiance reports pt asking for home during this admission)   Cognition Arousal/Alertness: Awake/alert Behavior During Therapy: Flat affect Overall Cognitive Status: Difficult to assess Area of Impairment: Rancho level               Rancho Levels of Cognitive Functioning Rancho Los Amigos Scales of Cognitive Functioning: Localized Response               General Comments: pt demonstrates Rancho coma recovery III localized responses. family verbalized behaviors that could show emerging IV behavior when more aroused. Rancho Mirant Scales of Cognitive Functioning: Localized Response   General Comments  3L [place] , noted to have a wound a chin area and fiance reports its an infected facial hair. 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, enjoys working on cars, dog must walk on leash      Prior Functioning/Environment Prior Level of Function : Independent/Modified Independent;Driving;Working/employed                        OT Problem List: Decreased strength;Decreased range of motion;Decreased activity tolerance;Impaired balance (sitting and/or standing);Impaired vision/perception;Decreased coordination;Decreased cognition;Decreased safety awareness;Decreased knowledge of use of DME or AE;Decreased knowledge of precautions;Cardiopulmonary status limiting activity;Impaired sensation;Impaired UE functional use      OT Treatment/Interventions: Self-care/ADL training;Therapeutic exercise;Neuromuscular education;Energy conservation;DME and/or AE instruction;Modalities;Manual therapy;Splinting;Therapeutic activities;Cognitive remediation/compensation;Visual/perceptual remediation/compensation;Patient/family education;Balance training    OT Goals(Current goals can be found in the care plan section) Acute Rehab OT Goals Patient Stated Goal: none stated by patient OT Goal Formulation: Patient unable to participate in goal setting Time For Goal Achievement: 01/02/23 Potential to Achieve Goals: Good  OT Frequency: Min 2X/week    Co-evaluation PT/OT/SLP Co-Evaluation/Treatment: Yes Reason for Co-Treatment: Complexity of the patient's impairments (multi-system involvement);Necessary to address cognition/behavior during functional activity;For patient/therapist safety;To address functional/ADL transfers   OT goals addressed  during session: ADL's and self-care;Strengthening/ROM;Proper use of Adaptive equipment and DME      AM-PAC OT "6 Clicks" Daily Activity     Outcome Measure Help from another person eating meals?: Total Help from  another person taking care of personal grooming?: Total Help from another person toileting, which includes using toliet, bedpan, or urinal?: Total Help from another person bathing (including washing, rinsing, drying)?: Total Help from another person to put on and taking off regular upper body clothing?: Total Help from another person to put on and taking off regular lower body clothing?: Total 6 Click Score: 6   End of Session Equipment Utilized During Treatment: Oxygen Nurse Communication: Mobility status;Precautions  Activity Tolerance: Patient tolerated treatment well Patient left: in bed;with call bell/phone within reach;with bed alarm set;with family/visitor present;with restraints reapplied  OT Visit Diagnosis: Unsteadiness on feet (R26.81);Muscle weakness (generalized) (M62.81)                Time: 8657-8469 OT Time Calculation (min): 24 min Charges:  OT General Charges $OT Visit: 1 Visit OT Evaluation $OT Eval High Complexity: 1 High   Brynn, OTR/L  Acute Rehabilitation Services Office: [phone] .   Mateo Flow 12/19/2022, 11:47 AM

## 2022-12-19 NOTE — Progress Notes (Signed)
Patient's mother was notified by phone of patient's transfer to [facility].

## 2022-12-19 NOTE — Progress Notes (Signed)
Inpatient Rehab Admissions Coordinator:   Per therapy recommendations,  patient was screened for CIR candidacy by Megan Salon, MS, CCC-SLP  At this time, Pt. is not medically ready for CIR. He is a ranchos III with no active participation with therapy at this time.  I will not pursue a rehab consult for this Pt. at this time, but CIR admissions team will follow and monitor for medical readiness and place consult order if Pt. appears to be an appropriate candidate. Please contact me with any questions.   Megan Salon, MS, CCC-SLP Rehab Admissions Coordinator  [phone] (celll) [phone] (office)

## 2022-12-19 NOTE — Evaluation (Signed)
Physical Therapy Evaluation Patient Details Name: Shawn Mills MRN: [id number] DOB: 04-Aug-1980 Today's Date: 12/19/2022  History of Present Illness  42 yo male s/p fall TBI with SAH, SDH, R temporal bone, vertical mastoid fx, parietal skull fx intubated 5/31- extubated 6/10 possible ICA dissection, fx sphenoid bones into carotid canals CIWA ETOH + PMH: none  Clinical Impression  Patient presents with decreased mobility due to R side weakness, decreased cognition, decreased balance, decreased safety awareness and decreased activity tolerance.  Patient assisted to EOB with +2 total A and sat EOB with max to total A working on arousal, attention, and initiating p.o. with couple ice chips.  Also initiated education with fiance regarding TBI recover with booklet and how to assist him.  Previously patient working full time in [employer], walking his dog and enjoying country music with fiance.  Patient will benefit from continued skilled PT in the acute setting and from follow up intensive inpatient rehab prior to d/c home.      Recommendations for follow up therapy are one component of a multi-disciplinary discharge planning process, led by the attending physician.  Recommendations may be updated based on patient status, additional functional criteria and insurance authorization.  Follow Up Recommendations       Assistance Recommended at Discharge Frequent or constant Supervision/Assistance  Patient can return home with the following  Two people to help with walking and/or transfers;Assistance with cooking/housework;Assist for transportation;Help with stairs or ramp for entrance;Two people to help with bathing/dressing/bathroom    Equipment Recommendations Other (comment) (TBA)  Recommendations for Other Services  Rehab consult    Functional Status Assessment Patient has had a recent decline in their functional status and demonstrates the ability to make significant improvements in function in  a reasonable and predictable amount of time.     Precautions / Restrictions Precautions Precautions: Fall Precaution Comments: Mittens, bil wrist restraints, flexiseal, foley      Mobility  Bed Mobility Overal bed mobility: Needs Assistance Bed Mobility: Rolling, Supine to Sit, Sit to Supine Rolling: +2 for safety/equipment, +2 for physical assistance, Max assist   Supine to sit: +2 for physical assistance, HOB elevated, +2 for safety/equipment, Max assist Sit to supine: Total assist, +2 for physical assistance, +2 for safety/equipment, HOB elevated   General bed mobility comments: pt requires (A ) to helicopter turn toward EOB and static sit eob. pt total (A) at EOB. pt initiates an anterior lean once during session. pt pushing with L UE at EOB toward R side.    Transfers                   General transfer comment: not tested at this time.    Ambulation/Gait                  Stairs            Wheelchair Mobility    Modified Rankin (Stroke Patients Only)       Balance Overall balance assessment: Needs assistance   Sitting balance-Leahy Scale: Zero Sitting balance - Comments: leaning back on therapist or leaning forward                                     Pertinent Vitals/Pain Pain Assessment Pain Assessment: PAINAD Breathing: normal Negative Vocalization: occasional moan/groan, low speech, negative/disapproving quality Facial Expression: smiling or inexpressive Body Language: relaxed Consolability: no need to console PAINAD Score: 1  Home Living Family/patient expects to be discharged to:: Private residence Living Arrangements: Spouse/significant other Available Help at Discharge: Family;Available 24 hours/day Type of Home: Apartment Home Access: Level entry       Home Layout: One level Home Equipment: Grab bars - tub/shower 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, enjoys working on cars, dog must walk on leash     Prior Function Prior Level of Function : Independent/Modified Independent;Driving;Working/employed                     Hand Dominance   Dominant Hand: Right    Extremity/Trunk Assessment   Upper Extremity Assessment Upper Extremity Assessment: Defer to OT evaluation    Lower Extremity Assessment Lower Extremity Assessment: RLE deficits/detail;LLE deficits/detail;Difficult to assess due to impaired cognition RLE Deficits / Details: no active movement noted and pt not following commands to move LLE Deficits / Details: moves spontaneously, but not to command       Communication   Communication: Other (comment) (whispering inaudible, fiance reports pt asking to go home)  Cognition Arousal/Alertness: Awake/alert Behavior During Therapy: Flat affect Overall Cognitive Status: Impaired/Different from baseline Area of Impairment: Rancho level               Rancho Levels of Cognitive Functioning Rancho Mirant Scales of Cognitive Functioning: Localized Response               General Comments: pt demonstrates Rancho coma recovery III localized responses. family verbalized behaviors that could show emerging IV behavior when more aroused.   Rancho Mirant Scales of Cognitive Functioning: Localized Response    General Comments General comments (skin integrity, edema, etc.): 3L [place] and VSS, had bloody drainage from wound under chin, fiance reports infected hair, noted rash at back    Exercises     Assessment/Plan    PT Assessment Patient needs continued PT services  PT Problem List Decreased strength;Decreased balance;Decreased cognition;Decreased mobility;Decreased safety awareness;Decreased coordination;Decreased activity tolerance       PT Treatment Interventions DME instruction;Functional mobility training;Balance training;Patient/family education;Therapeutic activities;Gait training;Therapeutic exercise;Cognitive remediation    PT Goals (Current goals  can be found in the Care Plan section)  Acute Rehab PT Goals Patient Stated Goal: to return home PT Goal Formulation: With family Time For Goal Achievement: 01/02/23 Potential to Achieve Goals: Fair    Frequency Min 4X/week     Co-evaluation PT/OT/SLP Co-Evaluation/Treatment: Yes Reason for Co-Treatment: Complexity of the patient's impairments (multi-system involvement);Necessary to address cognition/behavior during functional activity;For patient/therapist safety;To address functional/ADL transfers PT goals addressed during session: Mobility/safety with mobility;Balance         AM-PAC PT "6 Clicks" Mobility  Outcome Measure Help needed turning from your back to your side while in a flat bed without using bedrails?: Total Help needed moving from lying on your back to sitting on the side of a flat bed without using bedrails?: Total Help needed moving to and from a bed to a chair (including a wheelchair)?: Total Help needed standing up from a chair using your arms (e.g., wheelchair or bedside chair)?: Total Help needed to walk in hospital room?: Total Help needed climbing 3-5 steps with a railing? : Total 6 Click Score: 6    End of Session Equipment Utilized During Treatment: Oxygen Activity Tolerance: Patient limited by lethargy Patient left: in bed;with call bell/phone within reach;with bed alarm set;with family/visitor present;with restraints reapplied Nurse Communication: Mobility status PT Visit Diagnosis: Other abnormalities of gait and mobility (R26.89);Muscle weakness (generalized) (M62.81);Other symptoms and  signs involving the nervous system (R29.898)    Time: 1610-9604 PT Time Calculation (min) (ACUTE ONLY): 24 min   Charges:   PT Evaluation $PT Eval Moderate Complexity: 1 Mod          Cyndi Ryon Layton, PT Acute Rehabilitation Services [phone] 12/19/2022   [name] 12/19/2022, 2:35 PM

## 2022-12-19 NOTE — Progress Notes (Addendum)
Trauma/Critical Care Follow Up Note  Subjective:    Overnight Issues:   Objective:  Vital signs for last 24 hours: Temp:  [98.8 F (37.1 C)-99.9 F (37.7 C)] 99.5 F (37.5 C) (06/11 0800) Pulse Rate:  [83-138] 87 (06/11 0700) Resp:  [14-29] 20 (06/11 0700) BP: (140-169)/(79-105) 145/96 (06/11 0700) SpO2:  [89 %-100 %] 99 % (06/11 0700) Weight:  [106.2 kg] 106.2 kg (06/11 0500)  Hemodynamic parameters for last 24 hours:    Intake/Output from previous day: 06/10 0701 - 06/11 0700 In: 1736.9 [I.V.:56.9; NG/GT:1680] Out: 2900 [Urine:2700; Stool:200]  Intake/Output this shift: No intake/output data recorded.  Vent settings for last 24 hours:    Physical Exam:  Gen: comfortable, no distress Neuro: follows commands HEENT: PERRL Neck: supple CV: RRR Pulm: unlabored breathing on [place] Abd: soft, NT    GU: urine clear and yellow, +Foley Extr: wwp, no edema  Results for orders placed or performed during the hospital encounter of 12/08/22 (from the past 24 hour(s))  Glucose, capillary     Status: Abnormal   Collection Time: 12/18/22 11:34 AM  Result Value Ref Range   Glucose-Capillary 116 (H) 70 - 99 mg/dL  Glucose, capillary     Status: Abnormal   Collection Time: 12/18/22  3:41 PM  Result Value Ref Range   Glucose-Capillary 137 (H) 70 - 99 mg/dL  Glucose, capillary     Status: Abnormal   Collection Time: 12/18/22  7:11 PM  Result Value Ref Range   Glucose-Capillary 136 (H) 70 - 99 mg/dL  Glucose, capillary     Status: Abnormal   Collection Time: 12/18/22 11:09 PM  Result Value Ref Range   Glucose-Capillary 132 (H) 70 - 99 mg/dL  Glucose, capillary     Status: Abnormal   Collection Time: 12/19/22  3:13 AM  Result Value Ref Range   Glucose-Capillary 122 (H) 70 - 99 mg/dL  CBC     Status: Abnormal   Collection Time: 12/19/22  3:34 AM  Result Value Ref Range   WBC 18.2 (H) 4.0 - 10.5 K/uL   RBC 3.30 (L) 4.22 - 5.81 MIL/uL   Hemoglobin 11.3 (L) 13.0 - 17.0 g/dL    HCT 82.9 (L) 56.2 - 52.0 %   MCV 102.7 (H) 80.0 - 100.0 fL   MCH 34.2 (H) 26.0 - 34.0 pg   MCHC 33.3 30.0 - 36.0 g/dL   RDW 13.0 86.5 - 78.4 %   Platelets 621 (H) 150 - 400 K/uL   nRBC 0.0 0.0 - 0.2 %  Basic metabolic panel     Status: Abnormal   Collection Time: 12/19/22  3:34 AM  Result Value Ref Range   Sodium 141 135 - 145 mmol/L   Potassium 3.9 3.5 - 5.1 mmol/L   Chloride 105 98 - 111 mmol/L   CO2 27 22 - 32 mmol/L   Glucose, Bld 126 (H) 70 - 99 mg/dL   BUN 17 6 - 20 mg/dL   Creatinine, Ser 6.96 (L) 0.61 - 1.24 mg/dL   Calcium 8.7 (L) 8.9 - 10.3 mg/dL   GFR, Estimated >29 >52 mL/min   Anion gap 9 5 - 15  Glucose, capillary     Status: Abnormal   Collection Time: 12/19/22  8:05 AM  Result Value Ref Range   Glucose-Capillary 110 (H) 70 - 99 mg/dL    Assessment & Plan: The plan of care was discussed with the bedside nurse for the day, who is in agreement with this plan and  no additional concerns were raised.   Present on Admission:  TBI (traumatic brain injury) (HCC)    LOS: 11 days   Additional comments:I reviewed the patient's new clinical lab test results.   and I reviewed the patients new imaging test results.    S/p fall   TBI with SAH, SDH - NSGY c/s, Dr. Franky Macho, monitor neuro exam, keppra x7d for sz ppx. Repeat CT head 6/1 stable. Repeat CTA head/neck 6/5. EEG negative 6/5. Neuro exam improving, now F/C Sedation:  add seroquel BID Acute hypoxic ventilator dependent respiratory failure:  extubated 6/10, doing well R temporal bone/mastoid fx, parietal skull fx - NSGY c/s, Dr. Franky Macho, ENT Jearld Fenton) recs audiogram once extubated. No operative intervention required at this point.  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 ASA 81 per Dr. Franky Macho. Fx thru sphenoid bones into carotid canals; vertical mastoid fx Alcohol intoxication - CIWA, TOC c/s Mild elevated  transaminases - trend, transaminases higher along with alk phos.  Repeat later this week.  Suspected aspiration PNA, resolved- resp cx 12/11/22  H flu, ceftriaxone x5d (completed 6/7) Foley - d/c today for TOV FEN - NPO, TF, cortrak 6/12 DVT - SCDs, LMWH Dispo - [facility], therapies   Diamantina Monks, MD Trauma & General Surgery Please use [email or web address] to contact on call provider  12/19/2022  *Care during the described time interval was provided by me. I have reviewed this patient's available data, including medical history, events of note, physical examination and test results as part of my evaluation.

## 2022-12-20 ENCOUNTER — Inpatient Hospital Stay (HOSPITAL_COMMUNITY): Payer: Medicaid Other

## 2022-12-20 LAB — CBC
HCT: 35 % — ABNORMAL LOW (ref 39.0–52.0)
Hemoglobin: 11.4 g/dL — ABNORMAL LOW (ref 13.0–17.0)
MCH: 33.9 pg (ref 26.0–34.0)
MCHC: 32.6 g/dL (ref 30.0–36.0)
MCV: 104.2 fL — ABNORMAL HIGH (ref 80.0–100.0)
Platelets: 692 10*3/uL — ABNORMAL HIGH (ref 150–400)
RBC: 3.36 MIL/uL — ABNORMAL LOW (ref 4.22–5.81)
RDW: 13.8 % (ref 11.5–15.5)
WBC: 20.2 10*3/uL — ABNORMAL HIGH (ref 4.0–10.5)
nRBC: 0 % (ref 0.0–0.2)

## 2022-12-20 LAB — EXPECTORATED SPUTUM ASSESSMENT W GRAM STAIN, RFLX TO RESP C

## 2022-12-20 LAB — URINALYSIS, ROUTINE W REFLEX MICROSCOPIC
Bilirubin Urine: NEGATIVE
Glucose, UA: NEGATIVE mg/dL
Hgb urine dipstick: NEGATIVE
Ketones, ur: NEGATIVE mg/dL
Leukocytes,Ua: NEGATIVE
Nitrite: NEGATIVE
Protein, ur: NEGATIVE mg/dL
Specific Gravity, Urine: 1.017 (ref 1.005–1.030)
pH: 8 (ref 5.0–8.0)

## 2022-12-20 LAB — BASIC METABOLIC PANEL
Anion gap: 11 (ref 5–15)
BUN: 19 mg/dL (ref 6–20)
CO2: 24 mmol/L (ref 22–32)
Calcium: 8.6 mg/dL — ABNORMAL LOW (ref 8.9–10.3)
Chloride: 107 mmol/L (ref 98–111)
Creatinine, Ser: 0.51 mg/dL — ABNORMAL LOW (ref 0.61–1.24)
GFR, Estimated: >60 mL/min (ref >60)
Glucose, Bld: 119 mg/dL — ABNORMAL HIGH (ref 70–99)
Potassium: 3.9 mmol/L (ref 3.5–5.1)
Sodium: 142 mmol/L (ref 135–145)

## 2022-12-20 LAB — GLUCOSE, CAPILLARY
Glucose-Capillary: 107 mg/dL — ABNORMAL HIGH (ref 70–99)
Glucose-Capillary: 112 mg/dL — ABNORMAL HIGH (ref 70–99)
Glucose-Capillary: 121 mg/dL — ABNORMAL HIGH (ref 70–99)
Glucose-Capillary: 122 mg/dL — ABNORMAL HIGH (ref 70–99)
Glucose-Capillary: 123 mg/dL — ABNORMAL HIGH (ref 70–99)
Glucose-Capillary: 128 mg/dL — ABNORMAL HIGH (ref 70–99)
Glucose-Capillary: 130 mg/dL — ABNORMAL HIGH (ref 70–99)

## 2022-12-20 MED ORDER — PROPRANOLOL HCL 20 MG/5ML PO SOLN
20.0000 mg | Freq: Three times a day (TID) | ORAL | Status: DC
  Administered 2022-12-20 – 2022-12-31 (×36): 20 mg
  Filled 2022-12-20 (×38): qty 5

## 2022-12-20 MED ORDER — FUROSEMIDE 10 MG/ML IJ SOLN
40.0000 mg | Freq: Once | INTRAMUSCULAR | Status: AC
  Administered 2022-12-20: 40 mg via INTRAVENOUS
  Filled 2022-12-20: qty 4

## 2022-12-20 NOTE — Progress Notes (Signed)
Patient ID: Shawn Mills, male   DOB: 10/16/1980, 42 y.o.   MRN: [id number]      Subjective: Fever OVN per RN, not speaking ROS negative except as listed above. Objective: Vital signs in last 24 hours: Temp:  [97.7 F (36.5 C)-101.2 F (38.4 C)] 99.2 F (37.3 C) (06/12 0900) Pulse Rate:  [76-129] 121 (06/12 0900) Resp:  [18-37] 36 (06/12 0900) BP: (131-172)/(88-106) 148/93 (06/12 0900) SpO2:  [88 %-100 %] 99 % (06/12 0900) Weight:  [102.3 kg] 102.3 kg (06/12 0500) Last BM Date : 12/19/22  Intake/Output from previous day: 06/11 0701 - 06/12 0700 In: 1590.2 [NG/GT:1590.2] Out: 2900 [Urine:2500; Stool:400] Intake/Output this shift: No intake/output data recorded.  General appearance: mild agitated Resp: some tachypnea, no sig rhonchi Cardio: regular rate and rhythm GI: soft, NT, ND Extremities: calves soft HR 120s Lab Results: CBC  Recent Labs    12/19/22 0334 12/20/22 0151  WBC 18.2* 20.2*  HGB 11.3* 11.4*  HCT 33.9* 35.0*  PLT 621* 692*   BMET Recent Labs    12/19/22 0334 12/20/22 0151  NA 141 142  K 3.9 3.9  CL 105 107  CO2 27 24  GLUCOSE 126* 119*  BUN 17 19  CREATININE 0.50* 0.51*  CALCIUM 8.7* 8.6*   PT/INR No results for input(s): "LABPROT", "INR" in the last 72 hours. ABG No results for input(s): "PHART", "HCO3" in the last 72 hours.  Invalid input(s): "PCO2", "PO2"  Studies/Results: DG Abd 1 View  Result Date: 12/19/2022 CLINICAL DATA:  Orogastric tube placement EXAM: ABDOMEN - 1 VIEW COMPARISON:  12/14/2022 FINDINGS: The orogastric tube tip is in the stomach body, satisfactorily positioned. There is some faint right infrahilar and perihilar patchy airspace opacity. IMPRESSION: 1. Orogastric tube tip in the stomach body. 2. Faint right infrahilar and perihilar patchy airspace opacity. Electronically Signed   By: Gaylyn Rong M.D.   On: 12/19/2022 14:05   DG CHEST PORT 1 VIEW  Result Date: 12/19/2022 CLINICAL DATA:  Orogastric tube  replacement EXAM: PORTABLE CHEST 1 VIEW COMPARISON:  12/17/2022 FINDINGS: The previous endotracheal tube is no longer seen. The orogastric tube enters the stomach. The patient is rotated to the right on today's radiograph, reducing diagnostic sensitivity and specificity. Bilateral interstitial accentuation with right greater than left perihilar and right infrahilar density. Although this could represent incipient edema, the heart is not enlarged, and noncardiogenic edema is a possibility. Aspiration pneumonitis or perihilar pulmonary hemorrhage is a possibility on the right. No pneumothorax or pneumomediastinum. No blunting of the costophrenic angles. IMPRESSION: 1. The orogastric tube enters the stomach. 2. Bilateral interstitial accentuation with right greater than left perihilar and right infrahilar density. Although this could represent incipient edema, the heart is not enlarged, and noncardiogenic edema is a possibility. Aspiration pneumonitis or perihilar pulmonary hemorrhage is a possibility on the right. Electronically Signed   By: Gaylyn Rong M.D.   On: 12/19/2022 14:03    Anti-infectives: Anti-infectives (From admission, onward)    Start     Dose/Rate Route Frequency Ordered Stop   12/11/22 1115  cefTRIAXone (ROCEPHIN) 2 g in sodium chloride 0.9 % 100 mL IVPB        2 g 200 mL/hr over 30 Minutes Intravenous Every 24 hours 12/11/22 1020 12/15/22 1117       Assessment/Plan: S/p fall   TBI with SAH, SDH - NSGY c/s, Dr. Franky Macho, monitor neuro exam, keppra x7d for sz ppx. Repeat CT head 6/1 stable. Repeat CTA head/neck 6/5. EEG negative 6/5.  F/C at times now CV - add scheduled propranolol with TBI, tachy, HTN Sedation:   seroquel BID Acute hypoxic respiratory failure:  extubated 6/10, CXR now for fever R temporal bone/mastoid fx, parietal skull fx - NSGY c/s, Dr. Franky Macho, ENT Jearld Fenton) recs audiogram once extubated. No operative intervention required at this point.  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 ASA 81 per Dr. Franky Macho. Fx thru sphenoid bones into carotid canals; vertical mastoid fx Alcohol intoxication - CIWA, TOC c/s Mild elevated transaminases  Suspected aspiration PNA, resolved- resp cx 12/11/22  H flu, ceftriaxone x5d (completed 6/7) Fever - CXR and U/A now Foley - d/c 6/11, good U/O FEN - NPO, TF, cortrak 6/12 DVT - SCDs, LMWH Dispo - 4NP, therapies I spoke with family at the bedside    LOS: 12 days    Violeta Gelinas, MD, MPH, FACS Trauma & General Surgery Use [email or web address] to contact on call provider  12/20/2022

## 2022-12-20 NOTE — Progress Notes (Signed)
Physical Therapy Treatment Patient Details Name: Shawn Mills MRN: [id number] DOB: 1980/09/23 Today's Date: 12/20/2022   History of Present Illness 42 yo male s/p fall TBI with SAH, SDH, R temporal bone, vertical mastoid fx, parietal skull fx intubated 5/31- extubated 6/10 possible ICA dissection, fx sphenoid bones into carotid canals CIWA ETOH + PMH: none    PT Comments    Patient with limited arousal this session given pain meds 30 min prior to session and fiance not in the room.  He would open eyes to vigorous stimulus but not maintain despite up to chair position in bed.  Will continue to benefit from skilled PT in the acute setting.  Follow up therapies at intensive inpatient rehab recommended as well.   Recommendations for follow up therapy are one component of a multi-disciplinary discharge planning process, led by the attending physician.  Recommendations may be updated based on patient status, additional functional criteria and insurance authorization.  Follow Up Recommendations       Assistance Recommended at Discharge Frequent or constant Supervision/Assistance  Patient can return home with the following Two people to help with walking and/or transfers;Assistance with cooking/housework;Assist for transportation;Help with stairs or ramp for entrance;Two people to help with bathing/dressing/bathroom   Equipment Recommendations  Other (comment) (TBA)    Recommendations for Other Services       Precautions / Restrictions Precautions Precautions: Fall Precaution Comments: Mittens, bil wrist restraints, flexiseal, foley     Mobility  Bed Mobility Overal bed mobility: Needs Assistance       Supine to sit: Total assist     General bed mobility comments: up to sit in chair position in bed (no +2 A for attempting EOB)    Transfers                        Ambulation/Gait                   Stairs             Wheelchair Mobility     Modified Rankin (Stroke Patients Only)       Balance                                            Cognition Arousal/Alertness: Lethargic Behavior During Therapy: Flat affect Overall Cognitive Status: Difficult to assess Area of Impairment: Rancho level               Rancho Levels of Cognitive Functioning Rancho Los Amigos Scales of Cognitive Functioning: Localized Response                   Rancho Mirant Scales of Cognitive Functioning: Localized Response    Exercises Other Exercises Other Exercises: PROM to all 4 extremities in supine in attempt to arouse pt; spontaneous movement of L UE only this session    General Comments General comments (skin integrity, edema, etc.): 2L [place] at rest with VSS; fiance not in room this session; limited eye opening to noxious stim only (sternal rub, upper trap deep pressure, hamstring stretch, loud clapping,) but eye opening not sustained      Pertinent Vitals/Pain Pain Assessment Breathing: normal Negative Vocalization: occasional moan/groan, low speech, negative/disapproving quality Facial Expression: smiling or inexpressive Body Language: relaxed Consolability: no need to console PAINAD Score: 1    Home Living  Prior Function            PT Goals (current goals can now be found in the care plan section) Progress towards PT goals: Not progressing toward goals - comment (due to lethargy)    Frequency    Min 4X/week      PT Plan Current plan remains appropriate    Co-evaluation              AM-PAC PT "6 Clicks" Mobility   Outcome Measure  Help needed turning from your back to your side while in a flat bed without using bedrails?: Total Help needed moving from lying on your back to sitting on the side of a flat bed without using bedrails?: Total Help needed moving to and from a bed to a chair (including a wheelchair)?: Total Help needed standing  up from a chair using your arms (e.g., wheelchair or bedside chair)?: Total Help needed to walk in hospital room?: Total Help needed climbing 3-5 steps with a railing? : Total 6 Click Score: 6    End of Session Equipment Utilized During Treatment: Oxygen Activity Tolerance: Patient limited by lethargy Patient left: in bed;with restraints reapplied;with bed alarm set   PT Visit Diagnosis: Other abnormalities of gait and mobility (R26.89);Muscle weakness (generalized) (M62.81);Other symptoms and signs involving the nervous system (R29.898)     Time: 3086-5784 PT Time Calculation (min) (ACUTE ONLY): 13 min  Charges:  $Therapeutic Activity: 8-22 mins                     Shawn Mills, PT Acute Rehabilitation Services Office:[phone] 12/20/2022    Shawn Mills 12/20/2022, 5:10 PM

## 2022-12-20 NOTE — TOC Progression Note (Signed)
Transition of Care [facility]) - Progression Note    Patient Details  Name: Norwood Quezada MRN: [id number] Date of Birth: 12-04-1980  [facility]) CM/SW Contact  Glennon Mac, RN Phone Number: 12/20/2022, 2:41 PM  Clinical Narrative:    PT/OT Recommending CIR; currently Rancho III, emerging IV on Rancho Los Amigos Scale of Cognitive Functioning.  Rehab admissions following for progress and medical readiness.     Expected Discharge Plan: IP Rehab Facility Barriers to Discharge: Continued Medical Work up  Expected Discharge Plan and Services   Discharge Planning Services: CM Consult   Living arrangements for the past 2 months: Apartment                                       Social Determinants of Health (SDOH) Interventions    Readmission Risk Interventions     No data to display         Quintella Baton, RN, BSN  Trauma/Neuro ICU Case Manager [phone]

## 2022-12-20 NOTE — Progress Notes (Signed)
   12/20/22 0750  Assess: MEWS Score  Temp (!) 101.2 F (38.4 C)  BP (!) 136/93  MAP (mmHg) 104  Pulse Rate (!) 112  ECG Heart Rate (!) 126  Resp (!) 37  SpO2 99 %  O2 Device Nasal Cannula  Assess: MEWS Score  MEWS Temp 1  MEWS Systolic 0  MEWS Pulse 2  MEWS RR 3  MEWS LOC 0  MEWS Score 6  MEWS Score Color Red  Assess: if the MEWS score is Yellow or Red  Were vital signs taken at a resting state? Yes  Focused Assessment Change from prior assessment (see assessment flowsheet)  Does the patient meet 2 or more of the SIRS criteria? Yes  Does the patient have a confirmed or suspected source of infection? No  MEWS guidelines implemented  Yes, red  Treat  MEWS Interventions Considered administering scheduled or prn medications/treatments as ordered  Take Vital Signs  Increase Vital Sign Frequency  Red: Q1hr x2, continue Q4hrs until patient remains green for 12hrs  Escalate  MEWS: Escalate Red: Discuss with charge nurse and notify provider. Consider notifying RRT. If remains red for 2 hours consider need for higher level of care  Notify: Charge Nurse/RN  Name of Charge Nurse/RN Notified [place]  Provider Notification  Provider Name/Title Leary Roca PA  Date Provider Notified 12/20/22  Time Provider Notified [phone]  Method of Notification Face-to-face  Notification Reason Change in status  Provider response In department  Date of Provider Response 12/20/22  Time of Provider Response 0755  Assess: SIRS CRITERIA  SIRS Temperature  1  SIRS Pulse 1  SIRS Respirations  1  SIRS WBC 1  SIRS Score Sum  4

## 2022-12-20 NOTE — Procedures (Signed)
Cortrak  Person Inserting Tube:  Kendell Bane C, RD Tube Type:  Cortrak - 43 inches Tube Size:  10 Tube Location:  Left nare Secured by: Bridle Technique Used to Measure Tube Placement:  Marking at nare/corner of mouth Cortrak Secured At:  70 cm   Cortrak Tube Team Note:  Consult received to place a Cortrak feeding tube. Spoke with trauma, ok to place bridle.   X-ray is required, abdominal x-ray has been ordered by the Cortrak team. Please confirm tube placement before using the Cortrak tube.   If the tube becomes dislodged please keep the tube and contact the Cortrak team at [email or web address] for replacement.  If after hours and replacement cannot be delayed, place a NG tube and confirm placement with an abdominal x-ray.    Cammy Copa., RD, LDN, CNSC See AMiON for contact information

## 2022-12-20 NOTE — Progress Notes (Signed)
Patient ID: Shawn Mills, male   DOB: 01/04/1981, 42 y.o.   MRN: [id number] BP 138/82 (BP Location: Right Arm)   Pulse 94   Temp (!) 100.5 F (38.1 C) (Axillary)   Resp (!) 22   Ht 5\' 10"  (1.778 m)   Wt 102.3 kg   SpO2 98%   BMI 32.36 kg/m  Not following commands. Moving all extremities Perrl, full eom No real change today. Continue supportive care.

## 2022-12-21 ENCOUNTER — Encounter (HOSPITAL_COMMUNITY): Payer: 59

## 2022-12-21 LAB — BASIC METABOLIC PANEL
Anion gap: 10 (ref 5–15)
BUN: 22 mg/dL — ABNORMAL HIGH (ref 6–20)
CO2: 26 mmol/L (ref 22–32)
Calcium: 8.5 mg/dL — ABNORMAL LOW (ref 8.9–10.3)
Chloride: 104 mmol/L (ref 98–111)
Creatinine, Ser: 0.56 mg/dL — ABNORMAL LOW (ref 0.61–1.24)
GFR, Estimated: >60 mL/min (ref >60)
Glucose, Bld: 135 mg/dL — ABNORMAL HIGH (ref 70–99)
Potassium: 3.9 mmol/L (ref 3.5–5.1)
Sodium: 140 mmol/L (ref 135–145)

## 2022-12-21 LAB — CBC
HCT: 35.1 % — ABNORMAL LOW (ref 39.0–52.0)
Hemoglobin: 11.7 g/dL — ABNORMAL LOW (ref 13.0–17.0)
MCH: 33.7 pg (ref 26.0–34.0)
MCHC: 33.3 g/dL (ref 30.0–36.0)
MCV: 101.2 fL — ABNORMAL HIGH (ref 80.0–100.0)
Platelets: 708 10*3/uL — ABNORMAL HIGH (ref 150–400)
RBC: 3.47 MIL/uL — ABNORMAL LOW (ref 4.22–5.81)
RDW: 13.5 % (ref 11.5–15.5)
WBC: 22.8 10*3/uL — ABNORMAL HIGH (ref 4.0–10.5)
nRBC: 0 % (ref 0.0–0.2)

## 2022-12-21 LAB — CULTURE, RESPIRATORY W GRAM STAIN

## 2022-12-21 LAB — EXPECTORATED SPUTUM ASSESSMENT W GRAM STAIN, RFLX TO RESP C

## 2022-12-21 LAB — GLUCOSE, CAPILLARY
Glucose-Capillary: 101 mg/dL — ABNORMAL HIGH (ref 70–99)
Glucose-Capillary: 103 mg/dL — ABNORMAL HIGH (ref 70–99)
Glucose-Capillary: 122 mg/dL — ABNORMAL HIGH (ref 70–99)
Glucose-Capillary: 131 mg/dL — ABNORMAL HIGH (ref 70–99)
Glucose-Capillary: 135 mg/dL — ABNORMAL HIGH (ref 70–99)
Glucose-Capillary: 152 mg/dL — ABNORMAL HIGH (ref 70–99)

## 2022-12-21 LAB — C DIFFICILE (CDIFF) QUICK SCRN (NO PCR REFLEX)
C Diff antigen: NEGATIVE
C Diff interpretation: NOT DETECTED
C Diff toxin: NEGATIVE

## 2022-12-21 NOTE — Progress Notes (Signed)
Subjective: CC: Awake and alert today. Does not answer orientation questions for me. Says he doesn't want to be here anymore. F/c x 4.   Fever to 101.2 yesterday. Afebrile since midnight. No tachycardia or hypotension this am - started on propanolol yesterday. On Endeavor. Sputum cx not acceptable for testing yesterday. Resp obtained sputum culture this am. CXR with pulm edema yesterday, no consolidation or effusion. S/p IV lasix x1. Good UOP at 1.4 mg/kg/hr. On TF's at 4ml/hr. No vomiting. 4 large liquid bm's. RN reports these are foul smelling and smelled like C. Diff. Asked for C. Diff testing. Labs pending this am. Received Ativan x 3 yesterday and x 1 since midnight.   Objective: Vital signs in last 24 hours: Temp:  [99.2 F (37.3 C)-101 F (38.3 C)] 99.5 F (37.5 C) (06/13 0751) Pulse Rate:  [79-121] 91 (06/13 0751) Resp:  [20-36] 28 (06/13 0751) BP: (120-148)/(74-93) 134/83 (06/13 0751) SpO2:  [92 %-100 %] 94 % (06/13 0751) Weight:  [96.9 kg] 96.9 kg (06/13 0500) Last BM Date : 12/20/22  Intake/Output from previous day: 06/12 0701 - 06/13 0700 In: 1763.7 [NG/GT:1763.7] Out: 3150 [Urine:3150] Intake/Output this shift: Total I/O In: 180 [NG/GT:180] Out: 300 [Urine:300]  PE: Gen:  Alert, NAD Card:  Reg Pulm:  CTAB, no W/R/R, effort normal. On o2 Abd: Soft, ND, NT (doesn't grimace). No rigidity or guarding. +BS GU: Cannister with straw colored urine.  Ext:  No LE edema  Neuro: Opens eyes to voice. HEENT: PERRL. Does not follow EOM. MAE's and f/c x 4 for ext movement.    Lab Results:  Recent Labs    12/19/22 0334 12/20/22 0151  WBC 18.2* 20.2*  HGB 11.3* 11.4*  HCT 33.9* 35.0*  PLT 621* 692*   BMET Recent Labs    12/19/22 0334 12/20/22 0151  NA 141 142  K 3.9 3.9  CL 105 107  CO2 27 24  GLUCOSE 126* 119*  BUN 17 19  CREATININE 0.50* 0.51*  CALCIUM 8.7* 8.6*   PT/INR No results for input(s): "LABPROT", "INR" in the last 72 hours. CMP      Component Value Date/Time   NA 142 12/20/2022 0151   K 3.9 12/20/2022 0151   CL 107 12/20/2022 0151   CO2 24 12/20/2022 0151   GLUCOSE 119 (H) 12/20/2022 0151   BUN 19 12/20/2022 0151   CREATININE 0.51 (L) 12/20/2022 0151   CALCIUM 8.6 (L) 12/20/2022 0151   PROT 6.3 (L) 12/17/2022 0227   ALBUMIN 2.3 (L) 12/17/2022 0227   AST 136 (H) 12/17/2022 0227   ALT 227 (H) 12/17/2022 0227   ALKPHOS 134 (H) 12/17/2022 0227   BILITOT 0.4 12/17/2022 0227   GFRNONAA >60 12/20/2022 0151   Lipase  No results found for: "LIPASE"  Studies/Results: DG CHEST PORT 1 VIEW  Result Date: 12/20/2022 CLINICAL DATA:  Fever.  Traumatic brain injury. EXAM: PORTABLE CHEST 1 VIEW COMPARISON:  12/19/2022 FINDINGS: Orogastric or nasogastric tube enters the abdomen. Pulmonary edema/ARDS pattern persists, with mild improvement. No consolidation, collapse or effusion. IMPRESSION: Persistent pulmonary edema/ARDS pattern, with some improvement. Electronically Signed   By: Paulina Fusi M.D.   On: 12/20/2022 13:43   DG Abd Portable 1V  Result Date: 12/20/2022 CLINICAL DATA:  Feeding tube placement. EXAM: PORTABLE ABDOMEN - 1 VIEW COMPARISON:  Radiographs 12/14/2022 and 12/11/2022.  CT 12/08/2022. FINDINGS: 1206 hours. Tip of the feeding tube projects over the right upper quadrant of the abdomen, likely in the distal  stomach or proximal duodenum. The visualized bowel gas pattern is normal. Various telemetry leads overlie the abdomen and lower chest. No acute osseous findings are seen. IMPRESSION: Feeding tube tip projects over the distal stomach or proximal duodenum. Electronically Signed   By: Carey Bullocks M.D.   On: 12/20/2022 12:52   DG Abd 1 View  Result Date: 12/19/2022 CLINICAL DATA:  Orogastric tube placement EXAM: ABDOMEN - 1 VIEW COMPARISON:  12/14/2022 FINDINGS: The orogastric tube tip is in the stomach body, satisfactorily positioned. There is some faint right infrahilar and perihilar patchy airspace opacity.  IMPRESSION: 1. Orogastric tube tip in the stomach body. 2. Faint right infrahilar and perihilar patchy airspace opacity. Electronically Signed   By: Gaylyn Rong M.D.   On: 12/19/2022 14:05   DG CHEST PORT 1 VIEW  Result Date: 12/19/2022 CLINICAL DATA:  Orogastric tube replacement EXAM: PORTABLE CHEST 1 VIEW COMPARISON:  12/17/2022 FINDINGS: The previous endotracheal tube is no longer seen. The orogastric tube enters the stomach. The patient is rotated to the right on today's radiograph, reducing diagnostic sensitivity and specificity. Bilateral interstitial accentuation with right greater than left perihilar and right infrahilar density. Although this could represent incipient edema, the heart is not enlarged, and noncardiogenic edema is a possibility. Aspiration pneumonitis or perihilar pulmonary hemorrhage is a possibility on the right. No pneumothorax or pneumomediastinum. No blunting of the costophrenic angles. IMPRESSION: 1. The orogastric tube enters the stomach. 2. Bilateral interstitial accentuation with right greater than left perihilar and right infrahilar density. Although this could represent incipient edema, the heart is not enlarged, and noncardiogenic edema is a possibility. Aspiration pneumonitis or perihilar pulmonary hemorrhage is a possibility on the right. Electronically Signed   By: Gaylyn Rong M.D.   On: 12/19/2022 14:03    Anti-infectives: Anti-infectives (From admission, onward)    Start     Dose/Rate Route Frequency Ordered Stop   12/11/22 1115  cefTRIAXone (ROCEPHIN) 2 g in sodium chloride 0.9 % 100 mL IVPB        2 g 200 mL/hr over 30 Minutes Intravenous Every 24 hours 12/11/22 1020 12/15/22 1117        Assessment/Plan S/p fall TBI with SAH, SDH - NSGY c/s, Dr. Franky Macho, monitor neuro exam, keppra x7d for sz ppx. Repeat CT head 6/1 stable. Repeat CTA head/neck 6/5. EEG negative 6/5. F/C at times now CV - added scheduled propranolol 6/12 with TBI, tachy,  HTN - improved.  Agitation/Sedation:  Seroquel BID. Ativan PRN.  Acute hypoxic respiratory failure:  extubated 6/10, CXR now for fever R temporal bone/mastoid fx, parietal skull fx - NSGY c/s, Dr. Franky Macho, ENT Jearld Fenton) recs audiogram once extubated. No operative intervention required at this point.  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 ASA 81 per Dr. Franky Macho. Fx thru sphenoid bones into carotid canals; vertical mastoid fx Alcohol intoxication - CIWA, TOC c/s. Thiamine, folate, multi.  Mild elevated transaminases  Suspected aspiration PNA, resolved- resp cx 12/11/22  H flu, ceftriaxone x5d (completed 6/7) ID/Fever - Rocephin 6/3 - 6/7 for PNA. Febrile overnight. CXR without consolidation or effusion. UA negative. Resp cx pending. Discussed with Dr. Drue Second about C. Diff. Will see if still having liquid stools in 4 hours and if so, will get C. Diff testing.  Foley - d/c 6/11, good U/O FEN - NPO (SLP eval). TF's via cortrak. Stop bowel regimen. IV lasix x 1 6/12 DVT - SCDs, LMWH Dispo -  4NP, therapies - CIR  I reviewed nursing notes, last 24 h vitals and pain scores, last 48 h intake and output, last 24 h labs and trends, and last 24 h imaging results. Discussed case with ID.    LOS: 13 days    Jacinto Halim , [facility] Surgery 12/21/2022, 8:53 AM Please see Amion for pager number during day hours 7:00am-4:30pm

## 2022-12-21 NOTE — Progress Notes (Signed)
OT Cancellation Note  Patient Details Name: Konor Noren MRN: [id number] DOB: 18-Aug-1980   Cancelled Treatment:    Reason Eval/Treat Not Completed: Fatigue/lethargy limiting ability to participate- pt asleep, arouses to tactile input very briefly.  Will check on later as able.   Barry Brunner, OT Acute Rehabilitation Services Office [phone]   Chancy Milroy 12/21/2022, 12:56 PM

## 2022-12-21 NOTE — Progress Notes (Signed)
SLP Cancellation Note  Patient Details Name: Shawn Mills MRN: [id number] DOB: 04/22/1981   Cancelled treatment:       Reason Eval/Treat Not Completed: Fatigue/lethargy limiting ability to participate   Claudine Mouton 12/21/2022, 1:27 PM

## 2022-12-21 NOTE — Progress Notes (Signed)
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  He was whispering a lot with fiance able to understand some of what he said.  He was able to assist briefly with balance on EOB after delay from command to sit up and look out the window, but it was short lived as pt mostly pushing to R throughout time on EOB.  Patient attending some to wash cloth for his face and performed with help to initiate.  Patient agitated after returned to supine and assist given to remove dried mucous from his nose to prevent him picking it and he loudly exclaimed though not intelligible.  RN reports pt had cursed at her as well.  Feel he remains mostly at [facility] level III though he has some behaviors of level IV.  PT will continue to follow in acute setting.  Recommend follow up intensive inpatient rehab prior to d/c home.    Recommendations for follow up therapy are one component of a multi-disciplinary discharge planning process, led by the attending physician.  Recommendations may be updated based on patient status, additional functional criteria and insurance authorization.  Follow Up Recommendations       Assistance Recommended at Discharge Frequent or constant Supervision/Assistance  Patient can return home with the following Two people to help with walking and/or transfers;Assistance with cooking/housework;Assist for transportation;Help with stairs or ramp for entrance;Two people to help with bathing/dressing/bathroom   Equipment Recommendations  Other (comment) (TBA)     Recommendations for Other Services       Precautions / Restrictions Precautions Precautions: Fall Precaution Comments: Mittens, bil wrist restraints, rectal tube, foley     Mobility  Bed Mobility Overal bed mobility: Needs Assistance Bed Mobility: Rolling, Sidelying to Sit, Sit to Supine Rolling: Max assist Sidelying to sit: Total assist, HOB elevated Supine to sit: Total assist     General bed mobility comments: assisted to flex knees then turn hips and for pt to reach with L hand; heavy lifting for trunk to upright and to scoot hips to EOB; assist for trunk to side then to lift legs onto bed to supine, +2 to scoot to [facility]    Transfers                   General transfer comment: unable with +1 A    Ambulation/Gait                   Stairs             Wheelchair Mobility    Modified Rankin (Stroke Patients Only)       Balance Overall balance assessment: Needs assistance   Sitting balance-Leahy Scale: Zero Sitting balance - Comments: leaning to L with mod to max A for balance progressed x about 30 sec with more upright posture after delay following command with L hand holding to EOB between his legs but once hand removed returned to R lateral lean Postural control: Right lateral lean  Cognition Arousal/Alertness: Awake/alert Behavior During Therapy: Restless Overall Cognitive Status: Difficult to assess Area of Impairment: Rancho level               Rancho Levels of Cognitive Functioning Rancho Los Amigos Scales of Cognitive Functioning: Localized Response (with some level IV behaviors)               General Comments: patient more alert this session and whispering a lot to communicate though difficulty hearing or understanding him, though his fiance reports he is making a little more sense when she can hear him.  He followed commands to wash his face at the bedside with cues and  assist to initiate, he handed the cloth to his fiance when finished with assist to initiate. He spontaneously attempts to pick his nose and stretch his L arm over his head when hand untied.  He did straighten more upright with significant delay after cues to look outside.  Mostly distracted by internal stimuli though responding a little to external. Agitated when I cleaned out his nose and more forceful audible and irritated communication though still not able to understand what he was saying.   Rancho Mirant Scales of Cognitive Functioning: Localized Response (with some level IV behaviors)    Exercises      General Comments General comments (skin integrity, edema, etc.): Fiance in the room and music playing, she reports when he said "come on" and grabbed her hand they usually dance to the music at home.      Pertinent Vitals/Pain Pain Assessment Pain Assessment: Faces Faces Pain Scale: No hurt    Home Living                          Prior Function            PT Goals (current goals can now be found in the care plan section) Progress towards PT goals: Progressing toward goals    Frequency    Min 4X/week      PT Plan Current plan remains appropriate    Co-evaluation              AM-PAC PT "6 Clicks" Mobility   Outcome Measure  Help needed turning from your back to your side while in a flat bed without using bedrails?: Total Help needed moving from lying on your back to sitting on the side of a flat bed without using bedrails?: Total Help needed moving to and from a bed to a chair (including a wheelchair)?: Total Help needed standing up from a chair using your arms (e.g., wheelchair or bedside chair)?: Total Help needed to walk in hospital room?: Total Help needed climbing 3-5 steps with a railing? : Total 6 Click Score: 6    End of Session   Activity Tolerance: Patient limited by fatigue Patient left: in bed;with call bell/phone within reach;with  bed alarm set   PT Visit Diagnosis: Other abnormalities of gait and mobility (R26.89);Muscle weakness (generalized) (M62.81);Other symptoms and signs involving the nervous system (R29.898)     Time: 3244-0102 PT Time Calculation (min) (ACUTE ONLY): 36 min  Charges:  $Therapeutic Activity: 23-37 mins                     Sheran Lawless, PT Acute Rehabilitation Services Office:[phone] 12/21/2022    [name] 12/21/2022, 2:08 PM

## 2022-12-21 NOTE — Progress Notes (Signed)
Resp culture collected via NTS by RT per order. Culture collected sent to lab. RT will monitor as needed.

## 2022-12-21 NOTE — Progress Notes (Signed)
  Inpatient Rehabilitation Admissions Coordinator   I will place rehab consult for full assessment of CIR candidacy.  Ottie Glazier, RN, MSN Rehab Admissions Coordinator [phone] 12/21/2022 2:54 PM

## 2022-12-22 ENCOUNTER — Encounter (HOSPITAL_COMMUNITY): Payer: 59

## 2022-12-22 LAB — GLUCOSE, CAPILLARY
Glucose-Capillary: 101 mg/dL — ABNORMAL HIGH (ref 70–99)
Glucose-Capillary: 105 mg/dL — ABNORMAL HIGH (ref 70–99)
Glucose-Capillary: 127 mg/dL — ABNORMAL HIGH (ref 70–99)
Glucose-Capillary: 131 mg/dL — ABNORMAL HIGH (ref 70–99)
Glucose-Capillary: 131 mg/dL — ABNORMAL HIGH (ref 70–99)
Glucose-Capillary: 118 mg/dL — ABNORMAL HIGH (ref 70–99)

## 2022-12-22 LAB — CULTURE, RESPIRATORY W GRAM STAIN

## 2022-12-22 MED ORDER — SODIUM CHLORIDE 0.9 % IV SOLN
2.0000 g | Freq: Three times a day (TID) | INTRAVENOUS | Status: AC
  Administered 2022-12-22 – 2022-12-29 (×23): 2 g via INTRAVENOUS
  Filled 2022-12-22 (×24): qty 12.5

## 2022-12-22 NOTE — Progress Notes (Signed)
Subjective: CC: Awake and alert today. Following commands - I asked him to wiggle his toes and he wiggled both feet. Speaking to me and forming words but not always answering my questions appropriately.   TMAX 100.4, RR 24  Objective: Vital signs in last 24 hours: Temp:  [99.3 F (37.4 C)-100.4 F (38 C)] 99.4 F (37.4 C) (06/14 0810) Pulse Rate:  [83-94] 87 (06/14 0810) Resp:  [21-29] 24 (06/14 0810) BP: (108-143)/(70-94) 108/70 (06/14 0810) SpO2:  [93 %-99 %] 93 % (06/14 0810) Last BM Date : 12/21/22  Intake/Output from previous day: 06/13 0701 - 06/14 0700 In: 1289.3 [NG/GT:1289.3] Out: 1600 [Urine:1600] Intake/Output this shift: Total I/O In: -  Out: 500 [Urine:500]  PE: Gen:  Alert, NAD Card:  Reg Pulm:  CTAB, diminished at the bases with some rhonchi,  Abd: Soft, ND, NT (doesn't grimace). No rigidity or guarding. +BS GU: Cannister with straw colored urine.  Ext:  No LE edema  Neuro: eyes are open. Following some commands, not consistent. Quiet speech that is difficult to understand. HEENT: PERRL.   Lab Results:  Recent Labs    12/20/22 0151 12/21/22 1221  WBC 20.2* 22.8*  HGB 11.4* 11.7*  HCT 35.0* 35.1*  PLT 692* 708*   BMET Recent Labs    12/20/22 0151 12/21/22 1221  NA 142 140  K 3.9 3.9  CL 107 104  CO2 24 26  GLUCOSE 119* 135*  BUN 19 22*  CREATININE 0.51* 0.56*  CALCIUM 8.6* 8.5*   PT/INR No results for input(s): "LABPROT", "INR" in the last 72 hours. CMP     Component Value Date/Time   NA 140 12/21/2022 1221   K 3.9 12/21/2022 1221   CL 104 12/21/2022 1221   CO2 26 12/21/2022 1221   GLUCOSE 135 (H) 12/21/2022 1221   BUN 22 (H) 12/21/2022 1221   CREATININE 0.56 (L) 12/21/2022 1221   CALCIUM 8.5 (L) 12/21/2022 1221   PROT 6.3 (L) 12/17/2022 0227   ALBUMIN 2.3 (L) 12/17/2022 0227   AST 136 (H) 12/17/2022 0227   ALT 227 (H) 12/17/2022 0227   ALKPHOS 134 (H) 12/17/2022 0227   BILITOT 0.4 12/17/2022 0227   GFRNONAA >60  12/21/2022 1221   Lipase  No results found for: "LIPASE"  Studies/Results: DG Abd Portable 1V  Result Date: 12/20/2022 CLINICAL DATA:  Feeding tube placement. EXAM: PORTABLE ABDOMEN - 1 VIEW COMPARISON:  Radiographs 12/14/2022 and 12/11/2022.  CT 12/08/2022. FINDINGS: 1206 hours. Tip of the feeding tube projects over the right upper quadrant of the abdomen, likely in the distal stomach or proximal duodenum. The visualized bowel gas pattern is normal. Various telemetry leads overlie the abdomen and lower chest. No acute osseous findings are seen. IMPRESSION: Feeding tube tip projects over the distal stomach or proximal duodenum. Electronically Signed   By: Carey Bullocks M.D.   On: 12/20/2022 12:52    Anti-infectives: Anti-infectives (From admission, onward)    Start     Dose/Rate Route Frequency Ordered Stop   12/22/22 1100  ceFEPIme (MAXIPIME) 2 g in sodium chloride 0.9 % 100 mL IVPB        2 g 200 mL/hr over 30 Minutes Intravenous Every 8 hours 12/22/22 0949     12/11/22 1115  cefTRIAXone (ROCEPHIN) 2 g in sodium chloride 0.9 % 100 mL IVPB        2 g 200 mL/hr over 30 Minutes Intravenous Every 24 hours 12/11/22 1020 12/15/22 1117  Assessment/Plan S/p fall TBI with SAH, SDH - NSGY c/s, Dr. Franky Macho, monitor neuro exam, keppra x7d for sz ppx. Repeat CT head 6/1 stable. Repeat CTA head/neck 6/5. EEG negative 6/5. F/C at times now CV - added scheduled propranolol 6/12 with TBI, tachy, HTN - improved.  Agitation/Sedation:  Seroquel BID. Ativan PRN.  Acute hypoxic respiratory failure:  extubated 6/10, CXR now for fever R temporal bone/mastoid fx, parietal skull fx - NSGY c/s, Dr. Franky Macho, ENT Jearld Fenton) recs audiogram once extubated. No operative intervention required at this point.  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 ASA 81 per Dr. Franky Macho. Fx thru sphenoid bones into carotid  canals; vertical mastoid fx Alcohol intoxication - CIWA, TOC c/s. Thiamine, folate, multi.  Mild elevated transaminases  Suspected aspiration PNA, resolved- resp cx 12/11/22  H flu, ceftriaxone x5d (completed 6/7) ID/Fever - Rocephin 6/3 - 6/7 for PNA. Febrile intermittently for 72h. CXR without consolidation or effusion. UA negative. Resp cx w/ GNR, GPC. C.dif negative. Start empiric cefepime today for HCAP. Foley - d/c 6/11, good U/O FEN - NPO (SLP eval). TF's via cortrak. Stop bowel regimen. IV lasix x 1 6/12 DVT - SCDs, LMWH Dispo - 4NP, therapies - CIR, start abx for HCAP  I reviewed nursing notes, last 24 h vitals and pain scores, last 48 h intake and output, last 24 h labs and trends, and last 24 h imaging results. Discussed case with ID.    LOS: 14 days    Adam Phenix , [facility] 12/22/2022, 10:02 AM Please see Amion for pager number during day hours 7:00am-4:30pm

## 2022-12-22 NOTE — Progress Notes (Signed)
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 At beginning of session, Pt alert and reaching for my gait belt and attempting to mouth words. Increased level of arousal and improved ability to demonstrate short periods of sustained attention. Purposively wiping mouth and assisting with oral care automatically. Stood x 2 and gave his fiance a kiss in standing. Left in modified chair position  with "his favorite song" playing and Stalin appeared to be mouthing the words of the song. More appropriate with Rancho level IV at this time. Making steady progress. Patient will benefit from intensive inpatient follow up therapy, >3 hours/day. Family very appreciative.    Recommendations for follow up therapy are one component of a multi-disciplinary discharge planning process, led by the attending physician.  Recommendations may be updated based on patient status, additional functional criteria and insurance authorization.    Assistance Recommended at Discharge Frequent or constant Supervision/Assistance  Patient can return home with the following  Two people to help with bathing/dressing/bathroom   Equipment Recommendations  Wheelchair (measurements OT);Wheelchair cushion (measurements OT);Hospital bed;Other (comment)    Recommendations for Other Services Rehab consult    Precautions / Restrictions Precautions Precautions: Fall Precaution Comments: Mittens, bil wrist restraints, rectal tube, foley, cortrack       Mobility Bed Mobility                    Transfers                         Balance Overall balance  assessment: Needs assistance   Sitting balance-Leahy Scale: Poor Sitting balance - Comments: leaning R     Standing balance-Leahy Scale: Poor (did not feel R knee buckling)                             ADL either performed or assessed with clinical judgement   ADL Overall ADL's : Needs assistance/impaired     Grooming: Maximal assistance Grooming Details (indicate cue type and reason): wiped mouth using L hand; hand over hand for brushing teeth, automatically spit in trough                             Functional mobility during ADLs: Maximal assistance;+2 for physical assistance      Extremity/Trunk Assessment Upper Extremity Assessment RUE Deficits / Details: spontaneous movement RUE; moving L more than R; hand over hadn used for oral care and grooming; movemtn appeared to increase with automatic movements            Vision   Additional Comments: conjugate gaze; owuld visually attend to therapist; looking at phone/sustained when looking at album cover on phone; tracking R/L   Perception Perception Perception:  (R lat lean; poor midline awareness)   Praxis      Cognition Arousal/Alertness: Awake/alert Behavior During Therapy: WFL for tasks assessed/performed Overall Cognitive Status: Impaired/Different from baseline Area of Impairment: Attention, Following commands, Safety/judgement, Rancho level               Rancho Levels  of Cognitive Functioning Rancho Mirant Scales of Cognitive Functioning: Confused/Agitated   Current Attention Level: Focused   Following Commands: Follows one step commands inconsistently, Follows one step commands with increased time Safety/Judgement: Decreased awareness of deficits, Decreased awareness of safety     General Comments: Patient on EOB communicating with fiance and staff with some audible words "yes" in response to ready to stand, and did not want to clean his mouth though did participate some,  also to reach for therapist's belt with L hand did initate some with R hand at times for oral hygiene (hand over hand) and for initiating to use a straw for drinking from a cup.  Not really agitated, but more irritated by internal stimuli such as cortrak when on R side and impulsively reaching to pull it out [employer] Scales of Cognitive Functioning: Confused/Agitated      Exercises      Shoulder Instructions       General Comments patient drinking from straw and couging some, though audible swallow; SLP also engaging in attempts to vocalize louder    Pertinent Vitals/ Pain       Pain Assessment Pain Assessment: Faces Faces Pain Scale: No hurt  Home Living   Living Arrangements: Spouse/significant other;Other (Comment) (fiance's daughter) Available Help at Discharge: Family;Available 24 hours/day Type of Home: Apartment Home Access: Level entry     Home Layout: One level     Bathroom Shower/Tub: Chief Strategy Officer: Standard Bathroom Accessibility: Yes How Accessible: Accessible via walker        Lives With: Significant other;Other (Comment) (fiance's daughter)    Prior Functioning/Environment              Frequency  Min 2X/week        Progress Toward Goals  OT Goals(current goals can now be found in the care plan section)  Progress towards OT goals: Progressing toward goals  Acute Rehab OT Goals Patient Stated Goal: per fiance for [place] to get better OT Goal Formulation: With family Time For Goal Achievement: 01/02/23 Potential to Achieve Goals: Good ADL Goals Pt Will Perform Grooming: with adaptive equipment;with max assist;sitting Additional ADL Goal #1: pt will visually track from L visual field to R visual field x2 during session Additional ADL Goal #2: pt will sit eob max (A) as precursor to adls. Additional ADL Goal #3: pt will demonstrate following 1 step commands 50% of session  Plan Discharge plan remains appropriate     Co-evaluation      Reason for Co-Treatment: Complexity of the patient's impairments (multi-system involvement);Necessary to address cognition/behavior during functional activity;For patient/therapist safety;To address functional/ADL transfers PT goals addressed during session:  (cognition) OT goals addressed during session: ADL's and self-care;Strengthening/ROM;Proper use of Adaptive equipment and DME SLP goals addressed during session: Swallowing;Cognition;Communication    AM-PAC OT "6 Clicks" Daily Activity     Outcome Measure   Help from another person eating meals?: Total Help from another person taking care of personal grooming?: A Lot Help from another person toileting, which includes using toliet, bedpan, or urinal?: Total Help from another person bathing (including washing, rinsing, drying)?: Total Help from another person to put on and taking off regular upper body clothing?: Total Help from another person to put on and taking off regular lower body clothing?: Total 6 Click Score: 7    End of Session Equipment Utilized During Treatment: Gait belt  OT Visit Diagnosis: Unsteadiness on feet (R26.81);Muscle weakness (generalized) (M62.81)  Activity Tolerance Patient tolerated treatment well   Patient Left in bed;with call bell/phone within reach;with bed alarm set;with family/visitor present;with restraints reapplied   Nurse Communication Mobility status        Time: 1610-9604 OT Time Calculation (min): 37 min  Charges: OT General Charges $OT Visit: 1 Visit OT Treatments $Self Care/Home Management : 8-22 mins  Luisa Dago, OT/L   Acute OT Clinical Specialist Acute Rehabilitation Services Pager [phone] Office [phone]   [facility] 12/22/2022, 5:19 PM

## 2022-12-22 NOTE — PMR Pre-admission (Signed)
PMR Admission Coordinator Pre-Admission Assessment  Patient: Shawn Mills is an 42 y.o., male MRN: [id number] DOB: 07-22-1980 Height: 5\' 10"  (177.8 cm) Weight: 96.9 kg  Insurance Information HMO: yes    PPO:      PCP:      IPA:      80/20:      OTHER:  PRIMARY: [employer] QHP      Policy#: [id number]      Subscriber: patient CM Name: ***      Phone#: ***     Fax#: [phone] Pre-Cert#: ***      Employer:  Benefits:  Phone #: [phone]     Name:  Eff. Date: 12/09/22-07/10/23     Deduct: does not have one      Out of Pocket Max: $9,400 ($0 met)      Life Max: NA CIR: $2,500 co-pay for days 1-3      SNF: $2,500 co-pay for days 1-3 Outpatient: $80 co-pay/visit     Co-Pay:  Home Health: $80 co-pay/visit      Co-Pay:  DME: 50% coverage     Co-Pay: 50% co-insurance Providers: in-network SECONDARY: [employer]      Policy#: [id number]     Phone#: [phone]  Received faxed approval for admit 01/01/23 for admission 01/01/23-01/08/23  Financial Counselor:       Phone#:   The "Data Collection Information Summary" for patients in Inpatient Rehabilitation Facilities with attached "Privacy Act Statement-Health Care Records" was provided and verbally reviewed with: N/A  Emergency Contact Information Contact Information     Name Relation Home Work Mobile   [place] Father   [phone]   Payment,SYBIL Mother   [phone]       Current Medical History  Patient Admitting Diagnosis: TBI History of Present Illness: Pt is a 42 year old male with medical hx significant for: kidney stones Pt presented to [facility] on 12/08/22 after fall in which he struck his head. 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 Pt intubated 5/31-6/10. Neurosurgery consulted. Did not recommend operative intervention. 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 IR consulted. Pt underwent four-vessel cerebral arteriogram on 12/09/22 by Dr. Corliss Skains. Arteriogram negative except for occluded right internal jugular and right sigmoid sinus veins. ENT consulted and recommended no intervention at that time. EEG performed 12/14/22. EEG did not show seizures. Respiratory cultures with gram negative rods and gram positive coccus. Pt started on empiric cefepime for HCAP. Therapy evaluations completed and CIR recommended d/t pt's deficits in functional mobility, inability to complete ADLs, and cognitive-linguistic deficits.    Patient's medical record from [facility] has been reviewed by the rehabilitation admission coordinator and physician.  Past Medical History  No past medical history on file.  Has the patient had major surgery during 100 days prior to admission? No  Family History   family history is not on file.  Current Medications  Current Facility-Administered Medications:    acetaminophen (TYLENOL) 160 MG/5ML solution 1,000 mg, 1,000 mg, Per Tube, Q6H, Gaynelle Adu, MD, 1,000 mg at 12/22/22 1243   aspirin chewable tablet 81 mg, 81 mg, Per Tube, Daily, Gaynelle Adu, MD, 81 mg at 12/22/22 1007   ceFEPIme (MAXIPIME) 2 g in sodium chloride 0.9 % 100 mL IVPB, 2 g, Intravenous, Q8H, Calton Dach I, RPH, Last Rate:  200 mL/hr at 12/22/22 1027, 2 g at 12/22/22 1027   Chlorhexidine Gluconate Cloth 2 % PADS 6 each, 6 each, Topical, Daily, Gaynelle Adu, MD, 6 each at 12/21/22 2209   enoxaparin (LOVENOX) injection 40 mg, 40 mg, Subcutaneous, Q12H, Lovick, Lennie Odor, MD, 40 mg at 12/22/22 1007   feeding supplement (PIVOT 1.5 CAL) liquid 1,000 mL, 1,000 mL, Per Tube, Continuous, Lovick, Lennie Odor, MD, Last Rate: 70 mL/hr at 12/22/22 1245, 1,000 mL at 12/22/22 1245   fentaNYL (SUBLIMAZE) injection 50 mcg, 50 mcg, Intravenous, Q1H PRN, Violeta Gelinas, MD, 50 mcg at 12/20/22 9528   folic acid  (FOLVITE) tablet 1 mg, 1 mg, Per Tube, Daily, Gaynelle Adu, MD, 1 mg at 12/22/22 1007   hydrALAZINE (APRESOLINE) injection 10 mg, 10 mg, Intravenous, Q2H PRN, Gaynelle Adu, MD   LORazepam (ATIVAN) injection 1 mg, 1 mg, Intravenous, Q4H PRN, Violeta Gelinas, MD, 1 mg at 12/21/22 1833   metoprolol tartrate (LOPRESSOR) injection 5 mg, 5 mg, Intravenous, Q6H PRN, Gaynelle Adu, MD   multivitamin with minerals tablet 1 tablet, 1 tablet, Per Tube, Daily, Gaynelle Adu, MD, 1 tablet at 12/22/22 1007   ondansetron (ZOFRAN-ODT) disintegrating tablet 4 mg, 4 mg, Oral, Q6H PRN **OR** ondansetron (ZOFRAN) injection 4 mg, 4 mg, Intravenous, Q6H PRN, Gaynelle Adu, MD   Oral care mouth rinse, 15 mL, Mouth Rinse, PRN, Gaynelle Adu, MD   oxyCODONE (ROXICODONE) 5 MG/5ML solution 5-10 mg, 5-10 mg, Per Tube, Q4H PRN, Almond Lint, MD, 10 mg at 12/21/22 2205   propranolol (INDERAL) 20 MG/5ML solution 20 mg, 20 mg, Per Tube, TID, Violeta Gelinas, MD, 20 mg at 12/22/22 1007   QUEtiapine (SEROQUEL) tablet 50 mg, 50 mg, Per Tube, BID, Diamantina Monks, MD, 50 mg at 12/22/22 1007   thiamine (VITAMIN B1) tablet 100 mg, 100 mg, Per Tube, Daily, 100 mg at 12/22/22 1007 **OR** [DISCONTINUED] thiamine (VITAMIN B1) injection 100 mg, 100 mg, Intravenous, Daily, Gaynelle Adu, MD  Patients Current Diet:  Diet Order             Diet NPO time specified  Diet effective now                   Precautions / Restrictions Precautions Precautions: Fall Precaution Comments: Mittens, bil wrist restraints, rectal tube, foley Restrictions Weight Bearing Restrictions: No   Has the patient had 2 or more falls or a fall with injury in the past year? Yes  Prior Activity Level Community (5-7x/wk): works, drives, gets out of house daily  Prior Functional Level Self Care: Did the patient need help bathing, dressing, using the toilet or eating? Independent  Indoor Mobility: Did the patient need assistance with walking from room to  room (with or without device)? Independent  Stairs: Did the patient need assistance with internal or external stairs (with or without device)? Independent  Functional Cognition: Did the patient need help planning regular tasks such as shopping or remembering to take medications? Independent  Patient Information    Patient's Response To:     Home Assistive Devices / Equipment Home Equipment: Grab bars - tub/shower  Prior Device Use: Indicate devices/aids used by the patient prior to current illness, exacerbation or injury? None of the above  Current Functional Level Cognition  Arousal/Alertness: Lethargic Overall Cognitive Status: Difficult to assess Difficult to assess due to: Impaired communication Orientation Level: Disoriented to situation, Disoriented to time, Disoriented to place General Comments: patient more alert this session and whispering a lot to  communicate though difficulty hearing or understanding him, though his fiance reports he is making a little more sense when she can hear him.  He followed commands to wash his face at the bedside with cues and assist to initiate, he handed the cloth to his fiance when finished with assist to initiate. He spontaneously attempts to pick his nose and stretch his L arm over his head when hand untied.  He did straighten more upright with significant delay after cues to look outside.  Mostly distracted by internal stimuli though responding a little to external. Agitated when I cleaned out his nose and more forceful audible and irritated communication though still not able to understand what he was saying. Attention: Focused Focused Attention: Impaired Focused Attention Impairment: Verbal basic, Functional basic Ranch[place]ant Scales of Cognitive Functioning: Localized Response (with some level IV behaviors)    Extremity Assessment (includes Sensation/Coordination)  Upper Extremity Assessment: Defer to OT evaluation RUE Deficits /  Details: no active movement noted during session. RUE Coordination: decreased fine motor, decreased gross motor LUE Deficits / Details: reaching for face with L UE, undershooting and loose grasp on tooth brush attachment to suction LUE Coordination: decreased fine motor, decreased gross motor  Lower Extremity Assessment: RLE deficits/detail, LLE deficits/detail, Difficult to assess due to impaired cognition RLE Deficits / Details: no active movement noted and pt not following commands to move LLE Deficits / Details: moves spontaneously, but not to command    ADLs  Overall ADL's : Needs assistance/impaired Eating/Feeding: NPO Eating/Feeding Details (indicate cue type and reason): required ice chip placed in mouth, does not open mouth or take it off the spoon with SLp General ADL Comments: total (A) for all adls    Mobility  Overal bed mobility: Needs Assistance Bed Mobility: Rolling, Sidelying to Sit, Sit to Supine Rolling: Max assist Sidelying to sit: Total assist, HOB elevated Supine to sit: Total assist Sit to supine: Total assist, +2 for physical assistance, +2 for safety/equipment, HOB elevated General bed mobility comments: assisted to flex knees then turn hips and for pt to reach with L hand; heavy lifting for trunk to upright and to scoot hips to EOB; assist for trunk to side then to lift legs onto bed to supine, +2 to scoot to [facility]    Transfers  General transfer comment: unable with +1 A    Ambulation / Gait / Stairs / Engineer, drilling / Balance Dynamic Sitting Balance Sitting balance - Comments: leaning to L with mod to max A for balance progressed x about 30 sec with more upright posture after delay following command with L hand holding to EOB between his legs but once hand removed returned to R lateral lean Balance Overall balance assessment: Needs assistance Sitting-balance support: Single extremity supported, Feet supported Sitting balance-Leahy Scale:  Zero Sitting balance - Comments: leaning to L with mod to max A for balance progressed x about 30 sec with more upright posture after delay following command with L hand holding to EOB between his legs but once hand removed returned to R lateral lean Postural control: Right lateral lean    Special needs/care consideration Skin Ecchymosis: abdomen, arm,nare, thigh, leg/bilateral; External urinary catheter, NG tube, Bowel and Bladder incontinence   Previous Home Environment (from acute therapy documentation) Living Arrangements: Spouse/significant other, Other (Comment) (fiance's daughter)  Lives With: Significant other, Other (Comment) (fiance's daughter) Available Help at Discharge: Family, Available 24 hours/day Type of Home: Apartment Home Layout:  One level Home Access: Level entry Bathroom Shower/Tub: Engineer, manufacturing systems: Standard Bathroom Accessibility: Yes How Accessible: Accessible via walker Home Care Services: No 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, enjoys working on cars, dog must walk on Occupational psychologist for Discharge Living Setting: Patient's home Type of Home at Discharge: Apartment Discharge Home Layout: One level Discharge Home Access: Level entry Discharge Bathroom Shower/Tub: Tub/shower unit Discharge Bathroom Toilet: Standard Discharge Bathroom Accessibility: Yes How Accessible: Accessible via walker Does the patient have any problems obtaining your medications?: No  Social/Family/Support Systems Anticipated Caregiver: Melissa (fiance), her daughter, and other family Caregiver Availability: 24/7 Discharge Plan Discussed with Primary Caregiver: Yes Is Caregiver In Agreement with Plan?: Yes Does Caregiver/Family have Issues with Lodging/Transportation while Pt is in Rehab?: No  Goals Patient/Family Goal for Rehab: *** Expected length of stay: *** Pt/Family Agrees to Admission and willing to participate: Yes Program  Orientation Provided & Reviewed with Pt/Caregiver Including Roles  & Responsibilities: Yes  Decrease burden of Care through IP rehab admission: NA  Possible need for SNF placement upon discharge: Not anticipated  Patient Condition: I have reviewed medical records from [facility], spoken with CM, and patient and spouse. I met with patient at the bedside for inpatient rehabilitation assessment.  Patient will benefit from ongoing PT, OT, and SLP, can actively participate in 3 hours of therapy a day 5 days of the week, and can make measurable gains during the admission.  Patient will also benefit from the coordinated team approach during an Inpatient Acute Rehabilitation admission.  The patient will receive intensive therapy as well as Rehabilitation physician, nursing, social worker, and care management interventions.  Due to bladder management, bowel management, safety, skin/wound care, disease management, medication administration, pain management, and patient education the patient requires 24 hour a day rehabilitation nursing.  The patient is currently *** with mobility and basic ADLs.  Discharge setting and therapy post discharge at home with home health is anticipated.  Patient has agreed to participate in the Acute Inpatient Rehabilitation Program and will admit {Time; today/tomorrow:10263}.  Preadmission Screen Completed By:  Domingo Pulse, 12/22/2022 3:38 PM ______________________________________________________________________   Discussed status with Dr. Marland Kitchen on *** at *** and received approval for admission today.  Admission Coordinator:  Domingo Pulse, CCC-SLP, time ***/Date ***   Assessment/Plan: Diagnosis: Does the need for close, 24 hr/day Medical supervision in concert with the patient's rehab needs make it unreasonable for this patient to be served in a less intensive setting? {yes_no_potentially:3041433} Co-Morbidities requiring supervision/potential  complications: *** Due to {due ZO:1096045}, does the patient require 24 hr/day rehab nursing? {yes_no_potentially:3041433} Does the patient require coordinated care of a physician, rehab nurse, PT, OT, and SLP to address physical and functional deficits in the context of the above medical diagnosis(es)? {yes_no_potentially:3041433} Addressing deficits in the following areas: {deficits:3041436} Can the patient actively participate in an intensive therapy program of at least 3 hrs of therapy 5 days a week? {yes_no_potentially:3041433} The potential for patient to make measurable gains while on inpatient rehab is {potential:3041437} Anticipated functional outcomes upon discharge from inpatient rehab: {functional outcomes:304600100} PT, {functional outcomes:304600100} OT, {functional outcomes:304600100} SLP Estimated rehab length of stay to reach the above functional goals is: *** Anticipated discharge destination: {anticipated dc setting:21604} 10. Overall Rehab/Functional Prognosis: {potential:3041437}   MD Signature: ***

## 2022-12-22 NOTE — Plan of Care (Signed)
  Problem: Education: Goal: Knowledge of General Education information will improve Description: Including pain rating scale, medication(s)/side effects and non-pharmacologic comfort measures Outcome: Not Progressing   Problem: Clinical Measurements: Goal: Will remain free from infection Outcome: Progressing   Problem: Elimination: Goal: Will not experience complications related to bowel motility Outcome: Not Progressing

## 2022-12-22 NOTE — Evaluation (Signed)
Clinical/Bedside Swallow Evaluation Patient Details  Name: Shawn Mills MRN: [id number] Date of Birth: Oct 15, 1980  Today's Date: 12/22/2022 Time: SLP Start Time (ACUTE ONLY): 1240 SLP Stop Time (ACUTE ONLY): 1310 SLP Time Calculation (min) (ACUTE ONLY): 30 min  Past Medical History: No past medical history on file.  HPI:  42 yo male s/p fall TBI with SAH, SDH, R temporal bone, vertical mastoid fx, parietal skull fx intubated 5/31- extubated 6/10 possible ICA dissection, fx sphenoid bones into carotid canals CIWA ETOH + PMH: none    Assessment / Plan / Recommendation  Clinical Impression  Pt demonstrates improved arousal and attention sitting edge of bed with PT/OT. Pt able to reach out and self feed ice chips with assist, which was well tolerated and elicited clearer voice after some throat clear. Pt also assisted in taking sips of thin liquids which resulted in immediate coughign and sneezing, likely sensed aspiration. Pt demonstrates improvement but is not yet ready for oral intake. Would likely benefit from [facility] next week.      Aspiration Risk       Diet Recommendation NPO        Other  Recommendations      Recommendations for follow up therapy are one component of a multi-disciplinary discharge planning process, led by the attending physician.  Recommendations may be updated based on patient status, additional functional criteria and insurance authorization.2  Follow up Recommendations   CIR     Assistance Recommended at Discharge    Functional Status Assessment    Frequency and Duration            Prognosis        Swallow Study   General HPI: 42 yo male s/p fall TBI with SAH, SDH, R temporal bone, vertical mastoid fx, parietal skull fx intubated 5/31- extubated 6/10 possible ICA dissection, fx sphenoid bones into carotid canals CIWA ETOH + PMH: none Type of Study: Bedside Swallow Evaluation Previous Swallow Assessment: none Diet Prior to this Study:  NPO;Cortrak/Small bore NG tube Temperature Spikes Noted: No Respiratory Status: Room air History of Recent Intubation: Yes Total duration of intubation (days): 11 days Date extubated: 12/18/22 Behavior/Cognition: Alert;Cooperative;Pleasant mood Oral Care Completed by SLP: Yes Oral Cavity - Dentition: Adequate natural dentition Self-Feeding Abilities: Able to feed self Patient Positioning: Upright in bed (edge of bed) Baseline Vocal Quality: Low vocal intensity;Hoarse;Breathy Volitional Cough: Weak Volitional Swallow: Unable to elicit    Oral/Motor/Sensory Function Overall Oral Motor/Sensory Function: Generalized oral weakness   Ice Chips Ice chips: Impaired Oral Phase Impairments: Poor awareness of bolus   Thin Liquid Thin Liquid: Impaired Presentation: Straw Pharyngeal  Phase Impairments: Cough - Immediate    Nectar Thick Nectar Thick Liquid: Not tested   Honey Thick Honey Thick Liquid: Not tested   Puree Puree: Not tested   Solid     Solid: Not tested      Claudine Mouton 12/22/2022,1:22 PM

## 2022-12-22 NOTE — Progress Notes (Signed)
Physical Therapy Treatment Patient Details Name: Shawn Mills MRN: [id number] DOB: 1981-07-03 Today's Date: 12/22/2022   History of Present Illness 42 yo male s/p fall TBI with SAH, SDH, R temporal bone, vertical mastoid fx, parietal skull fx intubated 5/31- extubated 6/10 possible ICA dissection, fx sphenoid bones into carotid canals CIWA ETOH + PMH: none    PT Comments    Patient more alert and interactive though still with limited intelligible speech.  Some due to whispering and some due to articulation.  Patient able to sit with as little as min A at times though also up to max A for R lateral or posterior lean at times as well. Able to assist with oral care and drink from cup with A and stood x 2 with max to total A of 2.  Patient progressing as well with cognition displaying closer to [facility] level IV behaviors today.  PT will continue to follow.  Continue to recommend intensive inpatient rehab at d/c.   Recommendations for follow up therapy are one component of a multi-disciplinary discharge planning process, led by the attending physician.  Recommendations may be updated based on patient status, additional functional criteria and insurance authorization.  Follow Up Recommendations       Assistance Recommended at Discharge Frequent or constant Supervision/Assistance  Patient can return home with the following Two people to help with walking and/or transfers;Assistance with cooking/housework;Assist for transportation;Help with stairs or ramp for entrance;Two people to help with bathing/dressing/bathroom   Equipment Recommendations  Other (comment) (TBA)    Recommendations for Other Services       Precautions / Restrictions Precautions Precautions: Fall Precaution Comments: Mittens, bil wrist restraints, rectal tube, foley, cortrack     Mobility  Bed Mobility Overal bed mobility: Needs Assistance Bed Mobility: Sit to Sidelying, Supine to Sit     Supine to sit: HOB  elevated, Max assist, +2 for physical assistance   Sit to sidelying: Max assist, +2 for physical assistance General bed mobility comments: initiating to lift trunk after assisted for scooting hip and pt moving R leg off bed still +2 for safety, lines and to complete task; to supine pt initiating to lay on his side and lifting legs some    Transfers Overall transfer level: Needs assistance   Transfers: Sit to/from Stand Sit to Stand: +2 physical assistance, Max assist, From elevated surface           General transfer comment: assist for initiation and anterior lean as well as for lift off, more stable on L LE than R and needing assist for trunk upright, stood x 2 and second attempt fiance close so cues for tall posture to "give her a kiss"    Ambulation/Gait                   Stairs             Wheelchair Mobility    Modified Rankin (Stroke Patients Only)       Balance Overall balance assessment: Needs assistance Sitting-balance support: Feet supported Sitting balance-Leahy Scale: Poor Sitting balance - Comments: leaning posterior or anterior or to R at times; up to mod/max A though able to sit with min A at times as well even while engaged in task with OT or SLP Postural control: Right lateral lean, Posterior lean  Cognition Arousal/Alertness: Awake/alert Behavior During Therapy: WFL for tasks assessed/performed Overall Cognitive Status: Impaired/Different from baseline Area of Impairment: Attention, Following commands, Safety/judgement, Rancho level               Rancho Levels of Cognitive Functioning [place]igos Scales of Cognitive Functioning: Confused/Agitated   Current Attention Level: Focused   Following Commands: Follows one step commands inconsistently, Follows one step commands with increased time Safety/Judgement: Decreased awareness of deficits, Decreased awareness of safety      General Comments: Patient on EOB communicating with fiance and staff with some audible words "yes" in response to ready to stand, and did not want to clean his mouth though did participate some, also to reach for therapist's belt with L hand did initate some with R hand at times for oral hygiene (hand over hand) and for initiating to use a straw for drinking from a cup.  Not really agitated, but more irritated by internal stimuli such as cortrak when on R side and impulsively reaching to pull it out   [employer] Scales of Cognitive Functioning: Confused/Agitated    Exercises      General Comments General comments (skin integrity, edema, etc.): patient drinking from straw and couging some, though audible swallow; SLP also engaging in attempts to vocalize louder      Pertinent Vitals/Pain Pain Assessment Pain Assessment: Faces Faces Pain Scale: No hurt    Home Living   Living Arrangements: Spouse/significant other;Other (Comment) (fiance's daughter) Available Help at Discharge: Family;Available 24 hours/day Type of Home: Apartment Home Access: Level entry       Home Layout: One level        Prior Function            PT Goals (current goals can now be found in the care plan section) Progress towards PT goals: Progressing toward goals    Frequency    Min 4X/week      PT Plan Current plan remains appropriate    Co-evaluation PT/OT/SLP Co-Evaluation/Treatment: Yes Reason for Co-Treatment: Complexity of the patient's impairments (multi-system involvement);Necessary to address cognition/behavior during functional activity;For patient/therapist safety;To address functional/ADL transfers PT goals addressed during session: Mobility/safety with mobility;Balance;Other (comment) (cognition) OT goals addressed during session: ADL's and self-care;Strengthening/ROM;Proper use of Adaptive equipment and DME SLP goals addressed during session:  Swallowing;Cognition;Communication    AM-PAC PT "6 Clicks" Mobility   Outcome Measure  Help needed turning from your back to your side while in a flat bed without using bedrails?: Total Help needed moving from lying on your back to sitting on the side of a flat bed without using bedrails?: Total Help needed moving to and from a bed to a chair (including a wheelchair)?: Total Help needed standing up from a chair using your arms (e.g., wheelchair or bedside chair)?: Total Help needed to walk in hospital room?: Total Help needed climbing 3-5 steps with a railing? : Total 6 Click Score: 6    End of Session Equipment Utilized During Treatment: Oxygen Activity Tolerance: Patient tolerated treatment well Patient left: in bed;with call bell/phone within reach;with bed alarm set;with family/visitor present;with restraints reapplied   PT Visit Diagnosis: Other abnormalities of gait and mobility (R26.89);Muscle weakness (generalized) (M62.81);Other symptoms and signs involving the nervous system (R29.898)     Time: 1242-1310 PT Time Calculation (min) (ACUTE ONLY): 28 min  Charges:  $Therapeutic Activity: 8-22 mins  Sheran Lawless, PT Acute Rehabilitation Services Office:[phone] 12/22/2022    [name] 12/22/2022, 5:13 PM

## 2022-12-22 NOTE — Progress Notes (Signed)
Pharmacy Antibiotic Note  Shawn Mills is a 42 y.o. male admitted on 12/08/2022 with pneumonia.  Pharmacy has been consulted for cefepime dosing. WBBC 22.8, afeb (but with APAP on board). Sputum culture with GPR, GNR, GPC - awaiting speciation and sensitivities.   Plan: Cefepime 2g IV q 8h  -f/u renal function and LOT , culture data  Height: 5\' 10"  (177.8 cm) Weight: 96.9 kg (213 lb 10 oz) IBW/kg (Calculated) : 73  Temp (24hrs), Avg:99.7 F (37.6 C), Min:99.3 F (37.4 C), Max:100.4 F (38 C)  Recent Labs  Lab 12/16/22 0143 12/17/22 0227 12/19/22 0334 12/20/22 0151 12/21/22 1221  WBC 11.5* 11.8* 18.2* 20.2* 22.8*  CREATININE 0.57* 0.61 0.50* 0.51* 0.56*    Estimated Creatinine Clearance: 143.4 mL/min (A) (by C-G formula based on SCr of 0.56 mg/dL (L)).    No Known Allergies  Antimicrobials this admission: 6/3 CTX x 5d 6/14 cefepime>   Dose adjustments this admission:   Microbiology results: 6/13 Sputum: GNR, GPC, GPR -  6/3 TA - abundant H.flu, BL neg 6/1 mrsa pcr - neg  Thank you for allowing pharmacy to be a part of this patient's care.  Calton Dach, PharmD Clinical Pharmacist 12/22/2022 10:17 AM

## 2022-12-22 NOTE — Progress Notes (Signed)
Inpatient Rehab Admissions:  Inpatient Rehab Consult received.  I met with patient and his fiance Melissa at the bedside for rehabilitation assessment and to discuss goals and expectations of an inpatient rehab admission.  Pt was lethargic so spoke to [place]. Discussed average length of stay, insurance authorization requirement, discharge home after completion of CIR. She acknowledged understanding. She is interested in pt pursuing CIR. She confirmed that she and other family will be able to provide 24/7 support for pt after discharge. Will continue to follow.   Signed: Wolfgang [place], MS, CCC-SLP Admissions Coordinator [phone]

## 2022-12-23 LAB — GLUCOSE, CAPILLARY
Glucose-Capillary: 105 mg/dL — ABNORMAL HIGH (ref 70–99)
Glucose-Capillary: 99 mg/dL (ref 70–99)
Glucose-Capillary: 105 mg/dL — ABNORMAL HIGH (ref 70–99)
Glucose-Capillary: 110 mg/dL — ABNORMAL HIGH (ref 70–99)
Glucose-Capillary: 111 mg/dL — ABNORMAL HIGH (ref 70–99)
Glucose-Capillary: 128 mg/dL — ABNORMAL HIGH (ref 70–99)

## 2022-12-23 LAB — CBC
HCT: 34.4 % — ABNORMAL LOW (ref 39.0–52.0)
Hemoglobin: 11.9 g/dL — ABNORMAL LOW (ref 13.0–17.0)
MCH: 34 pg (ref 26.0–34.0)
MCHC: 34.6 g/dL (ref 30.0–36.0)
MCV: 98.3 fL (ref 80.0–100.0)
Platelets: 748 10*3/uL — ABNORMAL HIGH (ref 150–400)
RBC: 3.5 MIL/uL — ABNORMAL LOW (ref 4.22–5.81)
RDW: 13.3 % (ref 11.5–15.5)
WBC: 15.5 10*3/uL — ABNORMAL HIGH (ref 4.0–10.5)
nRBC: 0 % (ref 0.0–0.2)

## 2022-12-23 LAB — CULTURE, RESPIRATORY W GRAM STAIN

## 2022-12-23 MED ORDER — LORAZEPAM 2 MG/ML IJ SOLN
0.5000 mg | Freq: Four times a day (QID) | INTRAMUSCULAR | Status: DC | PRN
  Administered 2022-12-23 – 2022-12-28 (×13): 0.5 mg via INTRAVENOUS
  Filled 2022-12-23 (×13): qty 1

## 2022-12-23 MED ORDER — VANCOMYCIN HCL 1250 MG/250ML IV SOLN
1250.0000 mg | Freq: Two times a day (BID) | INTRAVENOUS | Status: DC
  Administered 2022-12-24 – 2022-12-25 (×4): 1250 mg via INTRAVENOUS
  Filled 2022-12-23 (×4): qty 250

## 2022-12-23 MED ORDER — VANCOMYCIN HCL 2000 MG/400ML IV SOLN
2000.0000 mg | Freq: Once | INTRAVENOUS | Status: AC
  Administered 2022-12-23: 2000 mg via INTRAVENOUS
  Filled 2022-12-23: qty 400

## 2022-12-23 NOTE — Progress Notes (Signed)
Pharmacy Antibiotic Note  Shawn Mills is a 42 y.o. male admitted on 12/08/2022 with MRSA pneumonia.  Pharmacy has been consulted for vancomycin dosing.   Plan: Vancomycin 2000mg  IV x1, then 1250mg  IV every 12 hours (cAUC 4 71, SCr used 0.8, Vd 0.5) x7 days.  Discontinue Cefepime.  Monitor renal function, culture results, and clinical status.   Height: 5\' 10"  (177.8 cm) Weight: 96.9 kg (213 lb 10 oz) IBW/kg (Calculated) : 73  Temp (24hrs), Avg:98.9 F (37.2 C), Min:97.8 F (36.6 C), Max:99.7 F (37.6 C)  Recent Labs  Lab 12/17/22 0227 12/19/22 0334 12/20/22 0151 12/21/22 1221 12/23/22 0304  WBC 11.8* 18.2* 20.2* 22.8* 15.5*  CREATININE 0.61 0.50* 0.51* 0.56*  --     Estimated Creatinine Clearance: 143.4 mL/min (A) (by C-G formula based on SCr of 0.56 mg/dL (L)).    No Known Allergies  Antimicrobials this admission: Cefepime 6/13 >>6/15 Vanc 6/15 >>  Dose adjustments this admission:   Microbiology results: 6/13 Cdiff negative 6/13 Sputum: abundant MRSA  6/1 MRSA PCR: negative  Thank you for allowing pharmacy to be a part of this patient's care.  Link Snuffer, PharmD, BCPS, BCCCP Clinical Pharmacist Please refer to [facility] for [facility] Pharmacy numbers 12/23/2022 1:39 PM

## 2022-12-23 NOTE — Progress Notes (Signed)
Subjective: CC: Somnolent this AM - got ativan at 0025 and 0320, oxy 5mg  at 0604, Seroquel at [phone] Still frequent liquid stools  Afebrile, VSS WBC 15 from 22 Objective: Vital signs in last 24 hours: Temp:  [97.8 F (36.6 C)-99.8 F (37.7 C)] 97.8 F (36.6 C) (06/15 0800) Pulse Rate:  [78-92] 78 (06/15 0800) Resp:  [16-28] 18 (06/15 0800) BP: (115-130)/(78-85) 129/81 (06/15 0800) SpO2:  [92 %-100 %] 99 % (06/15 0800) Weight:  [96.9 kg] 96.9 kg (06/15 0500) Last BM Date : 12/22/22  Intake/Output from previous day: 06/14 0701 - 06/15 0700 In: 730 [NG/GT:630; IV Piggyback:100] Out: 3000 [Urine:3000] Intake/Output this shift: No intake/output data recorded.  PE: Gen:  Alert, NAD Card:  Reg Pulm:  normal effort Abd: Soft, ND, NT ,No rigidity or guarding. +BS GU: Cannister with straw colored urine.  Ext:  No LE edema  Neuro: eyes are open intermittently to loud voice/when moved in bed, somnolent. Was FC yesterday but too somnolent to [facility] this AM. Moving all extremities spontaneously. .   Lab Results:  Recent Labs    12/21/22 1221 12/23/22 0304  WBC 22.8* 15.5*  HGB 11.7* 11.9*  HCT 35.1* 34.4*  PLT 708* 748*   BMET Recent Labs    12/21/22 1221  NA 140  K 3.9  CL 104  CO2 26  GLUCOSE 135*  BUN 22*  CREATININE 0.56*  CALCIUM 8.5*   PT/INR No results for input(s): "LABPROT", "INR" in the last 72 hours. CMP     Component Value Date/Time   NA 140 12/21/2022 1221   K 3.9 12/21/2022 1221   CL 104 12/21/2022 1221   CO2 26 12/21/2022 1221   GLUCOSE 135 (H) 12/21/2022 1221   BUN 22 (H) 12/21/2022 1221   CREATININE 0.56 (L) 12/21/2022 1221   CALCIUM 8.5 (L) 12/21/2022 1221   PROT 6.3 (L) 12/17/2022 0227   ALBUMIN 2.3 (L) 12/17/2022 0227   AST 136 (H) 12/17/2022 0227   ALT 227 (H) 12/17/2022 0227   ALKPHOS 134 (H) 12/17/2022 0227   BILITOT 0.4 12/17/2022 0227   GFRNONAA >60 12/21/2022 1221   Lipase  No results found for:  "LIPASE"  Studies/Results: No results found.  Anti-infectives: Anti-infectives (From admission, onward)    Start     Dose/Rate Route Frequency Ordered Stop   12/22/22 1100  ceFEPIme (MAXIPIME) 2 g in sodium chloride 0.9 % 100 mL IVPB        2 g 200 mL/hr over 30 Minutes Intravenous Every 8 hours 12/22/22 0949     12/11/22 1115  cefTRIAXone (ROCEPHIN) 2 g in sodium chloride 0.9 % 100 mL IVPB        2 g 200 mL/hr over 30 Minutes Intravenous Every 24 hours 12/11/22 1020 12/15/22 1117        Assessment/Plan S/p fall TBI with SAH, SDH - NSGY c/s, Dr. Franky Macho, monitor neuro exam, keppra x7d for sz ppx. Repeat CT head 6/1 stable. Repeat CTA head/neck 6/5. EEG negative 6/5. F/C at times now CV - added scheduled propranolol 6/12 with TBI, tachy, HTN - improved.  Agitation/Sedation:  Seroquel BID. Ativan PRN.  Acute hypoxic respiratory failure:  extubated 6/10, CXR now for fever R temporal bone/mastoid fx, parietal skull fx - NSGY c/s, Dr. Franky Macho, ENT Jearld Fenton) recs audiogram once extubated. No operative intervention required at this point.  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  ASA 81 per Dr. Franky Macho. Fx thru sphenoid bones into carotid canals; vertical mastoid fx Alcohol intoxication - CIWA, TOC c/s. Thiamine, folate, multi.  Mild elevated transaminases  Suspected aspiration PNA, resolved- resp cx 12/11/22  H flu, ceftriaxone x5d (completed 6/7) ID/Fever - Rocephin 6/3 - 6/7 for PNA. Febrile intermittently for 72h. CXR without consolidation or effusion. UA negative. Resp cx w/ abundant staph aureus (sensitivities pending).  Cefepime 6/14 >>C.dif negative. Foley - d/c 6/11, good UOP FEN - NPO (SLP eval). TF's via cortrak. Progressing with SLP, possible MBS next week DVT - SCDs, LMWH Dispo - 4NP, therapies - CIR following.     I reviewed nursing notes, last 24 h vitals and pain scores,  last 48 h intake and output, last 24 h labs and trends, and last 24 h imaging results. Discussed case with ID.    LOS: 15 days    Adam Phenix , [facility] Surgery 12/23/2022, 10:39 AM Please see Amion for pager number during day hours 7:00am-4:30pm

## 2022-12-23 NOTE — Progress Notes (Signed)
Overall stable.  Perhaps beginning to follow some simple commands.  Continue supportive efforts.  No new recommendations.

## 2022-12-24 LAB — GLUCOSE, CAPILLARY
Glucose-Capillary: 108 mg/dL — ABNORMAL HIGH (ref 70–99)
Glucose-Capillary: 117 mg/dL — ABNORMAL HIGH (ref 70–99)
Glucose-Capillary: 142 mg/dL — ABNORMAL HIGH (ref 70–99)
Glucose-Capillary: 121 mg/dL — ABNORMAL HIGH (ref 70–99)
Glucose-Capillary: 116 mg/dL — ABNORMAL HIGH (ref 70–99)
Glucose-Capillary: 125 mg/dL — ABNORMAL HIGH (ref 70–99)

## 2022-12-24 NOTE — Progress Notes (Signed)
Remains confused and agitated.  Exam nonfocal otherwise.  Continue supportive efforts.

## 2022-12-24 NOTE — Progress Notes (Signed)
Inpatient Rehab Admissions Coordinator:  Saw pt and fiance at bedside. Pt was asleep. Reviewed benefits with fiance. Will continue to follow.   Wolfgang Phoenix, MS, CCC-SLP Admissions Coordinator [phone]

## 2022-12-24 NOTE — Progress Notes (Signed)
Subjective: CC: Resting comfortably. Intermittent agitation and trying to remove mitts.  Objective: Vital signs in last 24 hours: Temp:  [97.6 F (36.4 C)-99.9 F (37.7 C)] 98 F (36.7 C) (06/16 0744) Pulse Rate:  [75-93] 93 (06/16 0317) Resp:  [17-21] 20 (06/16 0317) BP: (110-122)/(66-85) 117/79 (06/16 0317) SpO2:  [94 %-100 %] 97 % (06/16 0317) Weight:  [96.9 kg] 96.9 kg (06/16 0500) Last BM Date : 12/23/22  Intake/Output from previous day: 06/15 0701 - 06/16 0700 In: 3810 [NG/GT:2540; IV Piggyback:1150] Out: 400 [Urine:400] Intake/Output this shift: Total I/O In: -  Out: 600 [Urine:600]  PE: Gen:  Alert, NAD Card:  Reg Pulm:  normal effort Abd: Soft, ND, NT ,No rigidity or guarding. +BS GU: Cannister with straw colored urine.  Ext:  No LE edema  Neuro:Moving all extremities spontaneously.   Lab Results:  Recent Labs    12/21/22 1221 12/23/22 0304  WBC 22.8* 15.5*  HGB 11.7* 11.9*  HCT 35.1* 34.4*  PLT 708* 748*   BMET Recent Labs    12/21/22 1221  NA 140  K 3.9  CL 104  CO2 26  GLUCOSE 135*  BUN 22*  CREATININE 0.56*  CALCIUM 8.5*   PT/INR No results for input(s): "LABPROT", "INR" in the last 72 hours. CMP     Component Value Date/Time   NA 140 12/21/2022 1221   K 3.9 12/21/2022 1221   CL 104 12/21/2022 1221   CO2 26 12/21/2022 1221   GLUCOSE 135 (H) 12/21/2022 1221   BUN 22 (H) 12/21/2022 1221   CREATININE 0.56 (L) 12/21/2022 1221   CALCIUM 8.5 (L) 12/21/2022 1221   PROT 6.3 (L) 12/17/2022 0227   ALBUMIN 2.3 (L) 12/17/2022 0227   AST 136 (H) 12/17/2022 0227   ALT 227 (H) 12/17/2022 0227   ALKPHOS 134 (H) 12/17/2022 0227   BILITOT 0.4 12/17/2022 0227   GFRNONAA >60 12/21/2022 1221   Lipase  No results found for: "LIPASE"  Studies/Results: No results found.  Anti-infectives: Anti-infectives (From admission, onward)    Start     Dose/Rate Route Frequency Ordered Stop   12/24/22 0200  vancomycin (VANCOREADY) IVPB 1250  mg/250 mL        1,250 mg 166.7 mL/hr over 90 Minutes Intravenous Every 12 hours 12/23/22 1342 12/31/22 0159   12/23/22 1430  vancomycin (VANCOREADY) IVPB 2000 mg/400 mL        2,000 mg 200 mL/hr over 120 Minutes Intravenous  Once 12/23/22 1342 12/23/22 1641   12/22/22 1100  ceFEPIme (MAXIPIME) 2 g in sodium chloride 0.9 % 100 mL IVPB        2 g 200 mL/hr over 30 Minutes Intravenous Every 8 hours 12/22/22 0949     12/11/22 1115  cefTRIAXone (ROCEPHIN) 2 g in sodium chloride 0.9 % 100 mL IVPB        2 g 200 mL/hr over 30 Minutes Intravenous Every 24 hours 12/11/22 1020 12/15/22 1117        Assessment/Plan S/p fall TBI with SAH, SDH - NSGY c/s, Dr. Franky Macho, monitor neuro exam, keppra x7d for sz ppx. Repeat CT head 6/1 stable. Repeat CTA head/neck 6/5. EEG negative 6/5. F/C at times now CV - added scheduled propranolol 6/12 with TBI, tachy, HTN - improved.  Agitation/Sedation:  Seroquel BID. Ativan PRN.  Acute hypoxic respiratory failure:  extubated 6/10, CXR now for fever R temporal bone/mastoid fx, parietal skull fx - NSGY c/s, Dr. Franky Macho, ENT Jearld Fenton) recs audiogram once  extubated. No operative intervention required at this point.  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 ASA 81 per Dr. Franky Macho. Fx thru sphenoid bones into carotid canals; vertical mastoid fx Alcohol intoxication - CIWA, TOC c/s. Thiamine, folate, multi.  Mild elevated transaminases  Suspected aspiration PNA, resolved- resp cx 12/11/22  H flu, ceftriaxone x5d (completed 6/7) ID/Fever - Rocephin 6/3 - 6/7 for PNA. Febrile intermittently for 72h. CXR without consolidation or effusion. UA negative. Resp CX MRSA. Cefepime 6/14 >>, vanc 6/15 >> C.dif was negative. Foley - d/c 6/11, good UOP FEN - NPO (SLP eval). TF's via cortrak. Progressing with SLP, possible MBS next week DVT - SCDs, LMWH Dispo - 4NP, therapies - CIR  following     I reviewed nursing notes, last 24 h vitals and pain scores, last 48 h intake and output, last 24 h labs and trends, and last 24 h imaging results. Discussed case with ID.    LOS: 16 days    Adam Phenix , [facility] Surgery 12/24/2022, 10:17 AM Please see Amion for pager number during day hours 7:00am-4:30pm

## 2022-12-25 LAB — CBC
HCT: 35.5 % — ABNORMAL LOW (ref 39.0–52.0)
Hemoglobin: 12.2 g/dL — ABNORMAL LOW (ref 13.0–17.0)
MCH: 33.9 pg (ref 26.0–34.0)
MCHC: 34.4 g/dL (ref 30.0–36.0)
MCV: 98.6 fL (ref 80.0–100.0)
Platelets: 827 10*3/uL — ABNORMAL HIGH (ref 150–400)
RBC: 3.6 MIL/uL — ABNORMAL LOW (ref 4.22–5.81)
RDW: 12.9 % (ref 11.5–15.5)
WBC: 14 10*3/uL — ABNORMAL HIGH (ref 4.0–10.5)
nRBC: 0 % (ref 0.0–0.2)

## 2022-12-25 LAB — BASIC METABOLIC PANEL
Anion gap: 12 (ref 5–15)
BUN: 21 mg/dL — ABNORMAL HIGH (ref 6–20)
CO2: 19 mmol/L — ABNORMAL LOW (ref 22–32)
Calcium: 8.6 mg/dL — ABNORMAL LOW (ref 8.9–10.3)
Chloride: 102 mmol/L (ref 98–111)
Creatinine, Ser: 0.57 mg/dL — ABNORMAL LOW (ref 0.61–1.24)
GFR, Estimated: >60 mL/min (ref >60)
Glucose, Bld: 115 mg/dL — ABNORMAL HIGH (ref 70–99)
Potassium: 4.1 mmol/L (ref 3.5–5.1)
Sodium: 133 mmol/L — ABNORMAL LOW (ref 135–145)

## 2022-12-25 LAB — VANCOMYCIN, PEAK: Vancomycin Pk: 12 ug/mL — ABNORMAL LOW (ref 30–40)

## 2022-12-25 LAB — GLUCOSE, CAPILLARY
Glucose-Capillary: 101 mg/dL — ABNORMAL HIGH (ref 70–99)
Glucose-Capillary: 121 mg/dL — ABNORMAL HIGH (ref 70–99)
Glucose-Capillary: 145 mg/dL — ABNORMAL HIGH (ref 70–99)
Glucose-Capillary: 110 mg/dL — ABNORMAL HIGH (ref 70–99)
Glucose-Capillary: 121 mg/dL — ABNORMAL HIGH (ref 70–99)

## 2022-12-25 LAB — VANCOMYCIN, TROUGH
Vancomycin Tr: 8 ug/mL — ABNORMAL LOW (ref 15–20)
Vancomycin Tr: 8 ug/mL — ABNORMAL LOW (ref 15–20)

## 2022-12-25 LAB — HEPATIC FUNCTION PANEL
ALT: 74 U/L — ABNORMAL HIGH (ref 0–44)
AST: 34 U/L (ref 15–41)
Albumin: 2.6 g/dL — ABNORMAL LOW (ref 3.5–5.0)
Alkaline Phosphatase: 107 U/L (ref 38–126)
Bilirubin, Direct: <0.1 mg/dL (ref 0.0–0.2)
Total Bilirubin: 0.4 mg/dL (ref 0.3–1.2)
Total Protein: 7 g/dL (ref 6.5–8.1)

## 2022-12-25 MED ORDER — FENTANYL CITRATE PF 50 MCG/ML IJ SOSY
50.0000 ug | PREFILLED_SYRINGE | Freq: Four times a day (QID) | INTRAMUSCULAR | Status: DC | PRN
  Administered 2022-12-25 – 2022-12-27 (×5): 50 ug via INTRAVENOUS
  Filled 2022-12-25 (×5): qty 1

## 2022-12-25 MED ORDER — BANATROL TF EN LIQD
60.0000 mL | Freq: Two times a day (BID) | ENTERAL | Status: DC
  Administered 2022-12-25 – 2022-12-27 (×5): 60 mL
  Filled 2022-12-25 (×5): qty 60

## 2022-12-25 MED ORDER — VANCOMYCIN HCL 1500 MG/300ML IV SOLN
1500.0000 mg | Freq: Three times a day (TID) | INTRAVENOUS | Status: AC
  Administered 2022-12-26 – 2022-12-31 (×15): 1500 mg via INTRAVENOUS
  Filled 2022-12-25 (×15): qty 300

## 2022-12-25 NOTE — Progress Notes (Signed)
Physical Therapy Treatment Patient Details Name: Shawn Mills MRN: [id number] DOB: 09-17-1980 Today's Date: 12/25/2022   History of Present Illness 42 yo male s/p fall TBI with SAH, SDH, R temporal bone, vertical mastoid fx, parietal skull fx intubated 5/31- extubated 6/10 possible ICA dissection, fx sphenoid bones into carotid canals CIWA ETOH + PMH: none    PT Comments    Patient progressing with mobility and with moving through stages of recovery exhibiting full [place] level IV behaviors grabbing at rectal tube despite multiple redirections and needing direct focus to answer questions appropriately but quickly degrading back to undirected and confabulatory conversation.  Patient appropriate for intensive inpatient rehab prior to d/c home with assist.  PT will continue to follow.   Recommendations for follow up therapy are one component of a multi-disciplinary discharge planning process, led by the attending physician.  Recommendations may be updated based on patient status, additional functional criteria and insurance authorization.  Follow Up Recommendations       Assistance Recommended at Discharge Frequent or constant Supervision/Assistance  Patient can return home with the following Two people to help with walking and/or transfers;Assistance with cooking/housework;Assist for transportation;Help with stairs or ramp for entrance;Two people to help with bathing/dressing/bathroom   Equipment Recommendations  Other (comment) (TBA)    Recommendations for Other Services       Precautions / Restrictions Precautions Precautions: Fall Precaution Comments: Mittens, bil wrist restraints, rectal tube, foley, cortrack     Mobility  Bed Mobility Overal bed mobility: Needs Assistance Bed Mobility: Rolling, Sidelying to Sit, Sit to Supine Rolling: Supervision Sidelying to sit: Max assist, +2 for physical assistance Supine to sit: HOB elevated, Max assist, +2 for physical  assistance Sit to supine: Mod assist, +2 for safety/equipment   General bed mobility comments: already rolled on R side with L arm tied behind him consistently reaching for rectal tube; assist to lift heavy trun and move legs off bed as pt not following repeated commands; to supine pt initiating, assist for legs onto bed and for positioning in supine    Transfers Overall transfer level: Needs assistance Equipment used: Rolling walker (2 wheels) Transfers: Sit to/from Stand Sit to Stand: Max assist, +2 physical assistance           General transfer comment: assist for initiating placing hands on RW and for anterior lean, assisting to lift up with pt using LE's to lift; cues for forward gaze out window and assist for balance; stood x 2    Ambulation/Gait Ambulation/Gait assistance: Mod assist, +2 physical assistance Gait Distance (Feet): 1 Feet (x 2) Assistive device: Rolling walker (2 wheels) Gait Pattern/deviations: Step-to pattern, Trunk flexed       General Gait Details: side steps to HOB x 2 trials with RW and mod A of 2   Stairs             Wheelchair Mobility    Modified Rankin (Stroke Patients Only)       Balance Overall balance assessment: Needs assistance Sitting-balance support: Feet supported Sitting balance-Leahy Scale: Poor Sitting balance - Comments: falling to R initially when sitting, needing hand over hand to reach rail on L to sit with S.  Patient after standing trials sitting with S without UE support Postural control: Right lateral lean   Standing balance-Leahy Scale: Poor Standing balance comment: UE support and min A for balance  Cognition Arousal/Alertness: Awake/alert Behavior During Therapy: WFL for tasks assessed/performed Overall Cognitive Status: Impaired/Different from baseline Area of Impairment: Attention, Following commands, Safety/judgement, Rancho level               Rancho Levels  of Cognitive Functioning [place] Amigos Scales of Cognitive Functioning: Confused/Agitated   Current Attention Level: Focused   Following Commands: Follows one step commands inconsistently, Follows one step commands with increased time Safety/Judgement: Decreased awareness of safety, Decreased awareness of deficits     General Comments: communicating but many times not in response to questions until full attention gained then still somwhat confabulatory.  Patient restless in bed with PT entered scooting down despite wrist restriants and reaching for rectal tube   [facility] Scales of Cognitive Functioning: Confused/Agitated    Exercises      General Comments General comments (skin integrity, edema, etc.): audible verbal communication today, though somewhat confabulatory and highly distractible.      Pertinent Vitals/Pain Pain Assessment Faces Pain Scale: No hurt    Home Living                          Prior Function            PT Goals (current goals can now be found in the care plan section) Progress towards PT goals: Progressing toward goals    Frequency    Min 4X/week      PT Plan Current plan remains appropriate    Co-evaluation              AM-PAC PT "6 Clicks" Mobility   Outcome Measure  Help needed turning from your back to your side while in a flat bed without using bedrails?: A Lot Help needed moving from lying on your back to sitting on the side of a flat bed without using bedrails?: Total Help needed moving to and from a bed to a chair (including a wheelchair)?: Total Help needed standing up from a chair using your arms (e.g., wheelchair or bedside chair)?: Total Help needed to walk in hospital room?: Total Help needed climbing 3-5 steps with a railing? : Total 6 Click Score: 7    End of Session Equipment Utilized During Treatment: Gait belt Activity Tolerance: Patient tolerated treatment well Patient left: in bed;with  call bell/phone within reach;with restraints reapplied   PT Visit Diagnosis: Other abnormalities of gait and mobility (R26.89);Muscle weakness (generalized) (M62.81);Other symptoms and signs involving the nervous system (R29.898)     Time: 1610-9604 PT Time Calculation (min) (ACUTE ONLY): 27 min  Charges:  $Therapeutic Activity: 23-37 mins                     Sheran Lawless, PT Acute Rehabilitation Services Office:[phone] 12/25/2022    [name] 12/25/2022, 3:50 PM

## 2022-12-25 NOTE — Progress Notes (Signed)
Subjective: CC: Alert, answering my questions but not with appropriate responses.  Mitts In place, moving about in bed.  Objective: Vital signs in last 24 hours: Temp:  [98.1 F (36.7 C)-99 F (37.2 C)] 98.1 F (36.7 C) (06/17 0749) Pulse Rate:  [83-93] 86 (06/17 0749) Resp:  [18-20] 18 (06/17 0749) BP: (99-127)/(59-84) 127/79 (06/17 0749) SpO2:  [95 %-99 %] 98 % (06/17 0749) Weight:  [96.9 kg] 96.9 kg (06/17 0437) Last BM Date : 12/24/22  Intake/Output from previous day: 06/16 0701 - 06/17 0700 In: 2692.3 [NG/GT:1862.7; IV Piggyback:729.6] Out: 3000 [Urine:3000] Intake/Output this shift: No intake/output data recorded.  PE: Gen:  Alert, NAD Card:  Reg Pulm:  normal effort Abd: Soft, ND, NT ,No rigidity or guarding. +BS GU: Cannister with straw colored urine.  Ext:  No LE edema  Neuro:Moving all extremities spontaneously.   Lab Results:  Recent Labs    12/23/22 0304 12/25/22 0812  WBC 15.5* 14.0*  HGB 11.9* 12.2*  HCT 34.4* 35.5*  PLT 748* 827*   BMET No results for input(s): "NA", "K", "CL", "CO2", "GLUCOSE", "BUN", "CREATININE", "CALCIUM" in the last 72 hours.  PT/INR No results for input(s): "LABPROT", "INR" in the last 72 hours. CMP     Component Value Date/Time   NA 140 12/21/2022 1221   K 3.9 12/21/2022 1221   CL 104 12/21/2022 1221   CO2 26 12/21/2022 1221   GLUCOSE 135 (H) 12/21/2022 1221   BUN 22 (H) 12/21/2022 1221   CREATININE 0.56 (L) 12/21/2022 1221   CALCIUM 8.5 (L) 12/21/2022 1221   PROT 6.3 (L) 12/17/2022 0227   ALBUMIN 2.3 (L) 12/17/2022 0227   AST 136 (H) 12/17/2022 0227   ALT 227 (H) 12/17/2022 0227   ALKPHOS 134 (H) 12/17/2022 0227   BILITOT 0.4 12/17/2022 0227   GFRNONAA >60 12/21/2022 1221   Lipase  No results found for: "LIPASE"  Studies/Results: No results found.  Anti-infectives: Anti-infectives (From admission, onward)    Start     Dose/Rate Route Frequency Ordered Stop   12/24/22 0200  vancomycin  (VANCOREADY) IVPB 1250 mg/250 mL        1,250 mg 166.7 mL/hr over 90 Minutes Intravenous Every 12 hours 12/23/22 1342 12/31/22 0159   12/23/22 1430  vancomycin (VANCOREADY) IVPB 2000 mg/400 mL        2,000 mg 200 mL/hr over 120 Minutes Intravenous  Once 12/23/22 1342 12/23/22 1641   12/22/22 1100  ceFEPIme (MAXIPIME) 2 g in sodium chloride 0.9 % 100 mL IVPB        2 g 200 mL/hr over 30 Minutes Intravenous Every 8 hours 12/22/22 0949     12/11/22 1115  cefTRIAXone (ROCEPHIN) 2 g in sodium chloride 0.9 % 100 mL IVPB        2 g 200 mL/hr over 30 Minutes Intravenous Every 24 hours 12/11/22 1020 12/15/22 1117        Assessment/Plan S/p fall TBI with SAH, SDH - NSGY c/s, Dr. Franky Macho, monitor neuro exam, keppra x7d for sz ppx. Repeat CT head 6/1 stable. Repeat CTA head/neck 6/5. EEG negative 6/5. F/C at times now CV - added scheduled propranolol 6/12 with TBI, tachy, HTN - improved.  Agitation/Sedation:  Seroquel BID. Ativan PRN.  Acute hypoxic respiratory failure:  extubated 6/10, CXR now for fever R temporal bone/mastoid fx, parietal skull fx - NSGY c/s, Dr. Franky Macho, ENT Jearld Fenton) recs audiogram once extubated. No operative intervention required at this point.  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 ASA 81 per Dr. Franky Macho. Fx thru sphenoid bones into carotid canals; vertical mastoid fx Alcohol intoxication - CIWA, TOC c/s. Thiamine, folate, multi.  Mild elevated transaminases  Suspected aspiration PNA, resolved- resp cx 12/11/22  H flu, ceftriaxone x5d (completed 6/7) ID/Fever - Rocephin 6/3 - 6/7 for PNA. Febrile intermittently for 72h. CXR without consolidation or effusion. UA negative. Resp CX MRSA. Cefepime 6/14 >>, vanc 6/15 >> C.dif was negative. Foley - d/c 6/11, good UOP FEN - NPO (SLP eval). TF's via cortrak. Progressing with SLP, possible MBS this week DVT - SCDs, LMWH Dispo - 4NP,  therapies - CIR following  Has been overly sedated the last couple of days but is alert this morning. If his agitation increases then we can increase his Seroquel or add klonopin this week.    I reviewed nursing notes, last 24 h vitals and pain scores, last 48 h intake and output, last 24 h labs and trends, and last 24 h imaging results. Discussed case with ID.    LOS: 17 days    Adam Phenix , [facility] 12/25/2022, 9:48 AM Please see Amion for pager number during day hours 7:00am-4:30pm

## 2022-12-25 NOTE — Progress Notes (Signed)
Pharmacy Antibiotic Note  Shawn Mills is a 42 y.o. male admitted on 12/08/2022 with MRSA pneumonia.  Pharmacy has been consulted for vancomycin dosing.   Vanc peak (drawn late) 12 mcg Vanc trough 8 mcg AUC 284 on 1250mg  IV q12h T 1/2 ~11 hr, ke 0.0624  Plan: Change Vancomycin to 1500mg  IV q8h for calculated AUC of 425 (total of 7 days) Will consider levels at Css on new dose F/u renal function and micro data  Height: 5\' 10"  (177.8 cm) Weight: 96.9 kg (213 lb 10 oz) IBW/kg (Calculated) : 73  Temp (24hrs), Avg:98.7 F (37.1 C), Min:98.1 F (36.7 C), Max:99 F (37.2 C)  Recent Labs  Lab 12/19/22 0334 12/20/22 0151 12/21/22 1221 12/23/22 0304 12/25/22 0811 12/25/22 0812 12/25/22 1441 12/25/22 1456  WBC 18.2* 20.2* 22.8* 15.5*  --  14.0*  --   --   CREATININE 0.50* 0.51* 0.56*  --   --  0.57*  --   --   VANCOTROUGH  --   --   --   --   --   --  8* 8*  VANCOPEAK  --   --   --   --  12*  --   --   --      Estimated Creatinine Clearance: 143.4 mL/min (A) (by C-G formula based on SCr of 0.57 mg/dL (L)).    No Known Allergies  Antimicrobials this admission: Cefepime 6/13 >>6/15 Vanc 6/15 >>  Dose adjustments this admission:   Microbiology results: 6/13 Cdiff negative 6/13 Sputum: abundant MRSA  6/1 MRSA PCR: negative  Thank you for allowing pharmacy to be a part of this patient's care.  Link Snuffer, PharmD, BCPS, BCCCP Clinical Pharmacist Please refer to [facility] for [facility] Pharmacy numbers 12/25/2022 4:50 PM

## 2022-12-25 NOTE — Progress Notes (Signed)
Nutrition Follow-up  DOCUMENTATION CODES:   Not applicable  INTERVENTION:   Continue tube feeds via Cortrak tube: - Pivot 1.5 @ goal rate of 70 ml/hr (1680 ml/day)  Tube feeding regimen provides 2520 kcal, 157.5 grams of protein, and 1260 ml of H2O.   - Continue MVI with minerals, thiamine, and folic acid daily per tube  - Add Banatrol TF BID per tube  - Noted possible MBS this week. Will monitor for diet advancement and adjust tube feeding regimen as appropriate.  NUTRITION DIAGNOSIS:   Increased nutrient needs related to (TBI) as evidenced by estimated needs.  Ongoing, being addressed via TF  GOAL:   Patient will meet greater than or equal to 90% of their needs  Met via TF  MONITOR:   TF tolerance  REASON FOR ASSESSMENT:   Consult Enteral/tube feeding initiation and management  ASSESSMENT:   Pt admitted after fall with TBI with SAH, SDH, R temporal bone fx, R parietal fx, possible ICA dissection, R mastoid fx, fx through spenhoids, vertical mastoid fx.  06/03 - Cortrak placed (no bridle per MD) 06/06 - Cortrak tube became dislodged, replaced with NG tube 06/10 - extubated 06/12 - Cortrak + bridle placed (tip distal stomach or proximal duodenum)  Pt remains NPO with tube feeds infusing via Cortrak.  Discussed pt with RN. Pt tolerating tube feeds well but having watery stools. Will add Banatrol TF BID to help bulk stool.  Met with pt at bedside. Pt alert and able to interact with RD at time of visit but unable to answer RD questions appropriately.  Pt with mild pitting generalized edema, mild pitting edema to BLE, and mild pitting facial edema.  Admit weight: 100 kg Current weight: 96.9 kg  Current TF: Pivot 1.5 @ 70 ml/hr  Medications reviewed and include: folic acid, MVI with minerals, thiamine, IV abx  Labs reviewed: WBC 14.0, BMP today pending CBG's: 101-142 x 24 hours  UOP: 3000 ml x 24 hours  Diet Order:   Diet Order             Diet NPO  time specified  Diet effective now                   EDUCATION NEEDS:   Not appropriate for education at this time  Skin:  Skin Assessment: Reviewed RN Assessment  Last BM:  12/25/22 small type 7 via FMS  Height:   Ht Readings from Last 1 Encounters:  12/13/22 5\' 10"  (1.778 m)    Weight:   Wt Readings from Last 1 Encounters:  12/25/22 96.9 kg    BMI:  Body mass index is 30.65 kg/m.  Estimated Nutritional Needs:   Kcal:  2400-2600  Protein:  125-145 grams  Fluid:  >2L/day    Mertie Clause, MS, RD, LDN Inpatient Clinical Dietitian Please see AMiON for contact information.

## 2022-12-25 NOTE — Progress Notes (Signed)
Inpatient Rehab Admissions Coordinator:  Pt not medically ready for potential CIR admission. Will continue to follow.   Orie Cuttino Graves Madden, MS, CCC-SLP Admissions Coordinator [phone]  

## 2022-12-26 ENCOUNTER — Encounter (HOSPITAL_COMMUNITY): Payer: 59

## 2022-12-26 DIAGNOSIS — R5383 Other fatigue: Secondary | ICD-10-CM | POA: Diagnosis not present

## 2022-12-26 DIAGNOSIS — R451 Restlessness and agitation: Secondary | ICD-10-CM

## 2022-12-26 DIAGNOSIS — S069X3S Unspecified intracranial injury with loss of consciousness of 1 hour to 5 hours 59 minutes, sequela: Secondary | ICD-10-CM | POA: Diagnosis not present

## 2022-12-26 LAB — GLUCOSE, CAPILLARY
Glucose-Capillary: 104 mg/dL — ABNORMAL HIGH (ref 70–99)
Glucose-Capillary: 113 mg/dL — ABNORMAL HIGH (ref 70–99)
Glucose-Capillary: 129 mg/dL — ABNORMAL HIGH (ref 70–99)
Glucose-Capillary: 135 mg/dL — ABNORMAL HIGH (ref 70–99)
Glucose-Capillary: 79 mg/dL (ref 70–99)
Glucose-Capillary: 93 mg/dL (ref 70–99)

## 2022-12-26 MED ORDER — OXYCODONE HCL 5 MG PO TABS
5.0000 mg | ORAL_TABLET | ORAL | Status: DC | PRN
  Administered 2022-12-26 – 2023-01-01 (×8): 5 mg
  Filled 2022-12-26 (×8): qty 1

## 2022-12-26 MED ORDER — QUETIAPINE FUMARATE 100 MG PO TABS
100.0000 mg | ORAL_TABLET | Freq: Two times a day (BID) | ORAL | Status: DC
  Administered 2022-12-26 – 2022-12-29 (×7): 100 mg
  Filled 2022-12-26 (×7): qty 1

## 2022-12-26 MED ORDER — CLONAZEPAM 0.25 MG PO TBDP
0.5000 mg | ORAL_TABLET | Freq: Two times a day (BID) | ORAL | Status: DC | PRN
  Administered 2022-12-26 – 2022-12-31 (×5): 0.5 mg
  Filled 2022-12-26 (×5): qty 2

## 2022-12-26 MED ORDER — METHYLPHENIDATE HCL 5 MG PO TABS
5.0000 mg | ORAL_TABLET | Freq: Two times a day (BID) | ORAL | Status: DC
  Administered 2022-12-27 – 2022-12-29 (×6): 5 mg via ORAL
  Filled 2022-12-26 (×6): qty 1

## 2022-12-26 MED ORDER — ACETAMINOPHEN 500 MG PO TABS
1000.0000 mg | ORAL_TABLET | Freq: Four times a day (QID) | ORAL | Status: DC
  Administered 2022-12-26 – 2023-01-01 (×20): 1000 mg
  Filled 2022-12-26 (×22): qty 2

## 2022-12-26 NOTE — Progress Notes (Signed)
IP rehab admissions - Please see consult done by Dr. Riley Kill today.  Patient is not ready for CIR yet.  I will follow up again tomorrow.  Call for questions.  [phone]

## 2022-12-26 NOTE — Progress Notes (Signed)
Physical Therapy Treatment Patient Details Name: Shawn Mills MRN: [id number] DOB: 08-27-1980 Today's Date: 12/26/2022   History of Present Illness 42 yo male s/p fall TBI with SAH, SDH, R temporal bone, vertical mastoid fx, parietal skull fx intubated 5/31- extubated 6/10 possible ICA dissection, fx sphenoid bones into carotid canals CIWA ETOH + PMH: none    PT Comments    Limited progress this session as pt lethargic and needing max stimulation to arouse and for participation though pt becoming more agitated with efforts to keep him up so returned to supine.  Noted some meds given per nursing with increase in Seroquel.  Feel could benefit from PM&R consult for medication for behavior to allow more even control and less lethargy.  Patient appropriate for intensive inpatient rehab prior to d/c home.   Recommendations for follow up therapy are one component of a multi-disciplinary discharge planning process, led by the attending physician.  Recommendations may be updated based on patient status, additional functional criteria and insurance authorization.  Follow Up Recommendations       Assistance Recommended at Discharge Frequent or constant Supervision/Assistance  Patient can return home with the following Two people to help with walking and/or transfers;Assistance with cooking/housework;Assist for transportation;Help with stairs or ramp for entrance;Two people to help with bathing/dressing/bathroom   Equipment Recommendations  Other (comment) (TBA)    Recommendations for Other Services       Precautions / Restrictions Precautions Precautions: Fall Precaution Comments: Mittens, bil wrist restraints, rectal tube, foley, cortrack     Mobility  Bed Mobility Overal bed mobility: Needs Assistance     Sidelying to sit: +2 for physical assistance, Total assist     Sit to sidelying: +2 for physical assistance, Total assist General bed mobility comments: total A +2     Transfers Overall transfer level: Needs assistance   Transfers: Sit to/from Stand Sit to Stand: Max assist, +2 physical assistance           General transfer comment: assist for lifting hips and cues and increased time for upright posture, pt with R knee flexion then both knees to sit back on EOB; not attempted again due to pt agitation    Ambulation/Gait                   Stairs             Wheelchair Mobility    Modified Rankin (Stroke Patients Only)       Balance Overall balance assessment: Needs assistance Sitting-balance support: Feet supported Sitting balance-Leahy Scale: Poor Sitting balance - Comments: posterior lean at times with lethargy and R lateral lean at times     Standing balance-Leahy Scale: Poor Standing balance comment: mod to max A for sitting balance today with lethargy                            Cognition Arousal/Alertness: Lethargic, Suspect due to medications Behavior During Therapy: Restless, Impulsive Overall Cognitive Status: Impaired/Different from baseline Area of Impairment: Orientation, Attention, Memory, Following commands, Safety/judgement, Awareness, Problem solving, [place]o level               [place] Levels of Cognitive Functioning [place] Amigos Scales of Cognitive Functioning: Confused/Agitated   Current Attention Level: Focused   Following Commands: Follows one step commands inconsistently Safety/Judgement: Decreased awareness of safety, Decreased awareness of deficits         [place] [address] Scales of Cognitive  Functioning: Confused/Agitated    Exercises      General Comments General comments (skin integrity, edema, etc.): VSS      Pertinent Vitals/Pain Pain Assessment Pain Assessment: Faces Faces Pain Scale: No hurt    Home Living                          Prior Function            PT Goals (current goals can now be found in the care plan section) Progress  towards PT goals: Not progressing toward goals - comment    Frequency    Min 4X/week      PT Plan Current plan remains appropriate    Co-evaluation PT/OT/SLP Co-Evaluation/Treatment: Yes Reason for Co-Treatment: Complexity of the patient's impairments (multi-system involvement);Necessary to address cognition/behavior during functional activity;For patient/therapist safety;To address functional/ADL transfers PT goals addressed during session: Mobility/safety with mobility;Balance        AM-PAC PT "6 Clicks" Mobility   Outcome Measure  Help needed turning from your back to your side while in a flat bed without using bedrails?: Total Help needed moving from lying on your back to sitting on the side of a flat bed without using bedrails?: Total Help needed moving to and from a bed to a chair (including a wheelchair)?: Total Help needed standing up from a chair using your arms (e.g., wheelchair or bedside chair)?: Total Help needed to walk in hospital room?: Total Help needed climbing 3-5 steps with a railing? : Total 6 Click Score: 6    End of Session Equipment Utilized During Treatment: Gait belt Activity Tolerance: Patient limited by lethargy Patient left: in bed;with call bell/phone within reach;with bed alarm set;with restraints reapplied   PT Visit Diagnosis: Other abnormalities of gait and mobility (R26.89);Muscle weakness (generalized) (M62.81);Other symptoms and signs involving the nervous system (R29.898)     Time: 1113-1150 PT Time Calculation (min) (ACUTE ONLY): 37 min  Charges:  $Therapeutic Activity: 8-22 mins                     Sheran Lawless, PT Acute Rehabilitation Services [phone] 12/26/2022    [name] 12/26/2022, 5:53 PM

## 2022-12-26 NOTE — Progress Notes (Signed)
Subjective: CC: Alert, trying to get OOB, in mitts and upper extremity restraints.  Answering questions with inappropriate speech. FC   During my exam SLP came in and said "how are you?" And the patient said "I'm alright", which is the first appropriate response I've seen from him.   Objective: Vital signs in last 24 hours: Temp:  [98.5 F (36.9 C)-99.5 F (37.5 C)] 98.6 F (37 C) (06/18 0745) Pulse Rate:  [76-96] 80 (06/18 0745) Resp:  [13-20] 19 (06/18 0745) BP: (107-123)/(61-81) 118/76 (06/18 0745) SpO2:  [97 %-100 %] 100 % (06/18 0745) Weight:  [96.9 kg] 96.9 kg (06/18 0500) Last BM Date : 12/25/22  Intake/Output from previous day: 06/17 0701 - 06/18 0700 In: 2410.1 [NG/GT:1860; IV Piggyback:550.1] Out: 550 [Urine:550] Intake/Output this shift: Total I/O In: 107 [NG/GT:107] Out: -   PE: Gen:  Alert, NAD Card:  Reg Pulm:  normal effort Abd: Soft, ND, NT ,No rigidity or guarding. +BS GU: condom cath with straw colored urine, rectal tube with liquid nonbloody stool Ext:  No LE edema  Neuro:Moving all extremities spontaneously, intermittently FC   Lab Results:  Recent Labs    12/25/22 0812  WBC 14.0*  HGB 12.2*  HCT 35.5*  PLT 827*   BMET Recent Labs    12/25/22 0812  NA 133*  K 4.1  CL 102  CO2 19*  GLUCOSE 115*  BUN 21*  CREATININE 0.57*  CALCIUM 8.6*    PT/INR No results for input(s): "LABPROT", "INR" in the last 72 hours. CMP     Component Value Date/Time   NA 133 (L) 12/25/2022 0812   K 4.1 12/25/2022 0812   CL 102 12/25/2022 0812   CO2 19 (L) 12/25/2022 0812   GLUCOSE 115 (H) 12/25/2022 0812   BUN 21 (H) 12/25/2022 0812   CREATININE 0.57 (L) 12/25/2022 0812   CALCIUM 8.6 (L) 12/25/2022 0812   PROT 7.0 12/25/2022 0812   ALBUMIN 2.6 (L) 12/25/2022 0812   AST 34 12/25/2022 0812   ALT 74 (H) 12/25/2022 0812   ALKPHOS 107 12/25/2022 0812   BILITOT 0.4 12/25/2022 0812   GFRNONAA >60 12/25/2022 0812   Lipase  No results found  for: "LIPASE"  Studies/Results: No results found.  Anti-infectives: Anti-infectives (From admission, onward)    Start     Dose/Rate Route Frequency Ordered Stop   12/26/22 2200  vancomycin (VANCOREADY) IVPB 1500 mg/300 mL        1,500 mg 150 mL/hr over 120 Minutes Intravenous Every 8 hours 12/25/22 1653 12/31/22 2159   12/24/22 0200  vancomycin (VANCOREADY) IVPB 1250 mg/250 mL  Status:  Discontinued        1,250 mg 166.7 mL/hr over 90 Minutes Intravenous Every 12 hours 12/23/22 1342 12/25/22 1653   12/23/22 1430  vancomycin (VANCOREADY) IVPB 2000 mg/400 mL        2,000 mg 200 mL/hr over 120 Minutes Intravenous  Once 12/23/22 1342 12/23/22 1641   12/22/22 1100  ceFEPIme (MAXIPIME) 2 g in sodium chloride 0.9 % 100 mL IVPB        2 g 200 mL/hr over 30 Minutes Intravenous Every 8 hours 12/22/22 0949     12/11/22 1115  cefTRIAXone (ROCEPHIN) 2 g in sodium chloride 0.9 % 100 mL IVPB        2 g 200 mL/hr over 30 Minutes Intravenous Every 24 hours 12/11/22 1020 12/15/22 1117        Assessment/Plan S/p fall TBI with SAH,  SDH - NSGY c/s, Dr. Franky Macho, monitor neuro exam, keppra x7d for sz ppx. Repeat CT head 6/1 stable. Repeat CTA head/neck 6/5. EEG negative 6/5. F/C at times now CV - added scheduled propranolol 6/12 with TBI, tachy, HTN - improved.  Agitation/Sedation:  more alert and agitated. Increase seroquel to 100 mg BID, add klonopin 0.5 mg BID PRN. Continue IV ativan PRN. Acute hypoxic respiratory failure:  extubated 6/10, CXR now for fever R temporal bone/mastoid fx, parietal skull fx - NSGY c/s, Dr. Franky Macho, ENT Jearld Fenton) recs audiogram once extubated. No operative intervention required at this point.  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 ASA 81 per Dr. Franky Macho. Fx thru sphenoid bones into carotid canals; vertical mastoid fx Alcohol intoxication - CIWA, TOC c/s. Thiamine,  folate, multi.  Mild elevated transaminases  Suspected aspiration PNA, resolved- resp cx 12/11/22  H flu, ceftriaxone x5d (completed 6/7) ID/Fever - Rocephin 6/3 - 6/7 for PNA. Febrile intermittently for 72h. CXR without consolidation or effusion. UA negative. Resp CX MRSA. Cefepime 6/14 >>, vanc 6/15 >> plan for a total of 7 d abx (6/21) C.dif was negative. Foley - d/c 6/11, good UOP FEN - NPO (SLP eval). TF's via cortrak. Progressing with SLP, possible MBS this week DVT - SCDs, LMWH Dispo - 4NP, therapies - CIR following  Increased seroquel and added klonopin BID PRN per tube.    I reviewed nursing notes, last 24 h vitals and pain scores, last 48 h intake and output, last 24 h labs and trends, and last 24 h imaging results. Discussed case with ID.    LOS: 18 days    Adam Phenix , [facility] 12/26/2022, 9:30 AM Please see Amion for pager number during day hours 7:00am-4:30pm

## 2022-12-26 NOTE — Progress Notes (Signed)
Speech Language Pathology Treatment: Dysphagia;Cognitive-Linquistic  Patient Details Name: Shawn Mills MRN: [id number] DOB: 07-May-1981 Today's Date: 12/26/2022 Time: 4098-1191 SLP Time Calculation (min) (ACUTE ONLY): 25 min  Assessment / Plan / Recommendation Clinical Impression  Pt demonstrates improved vocal quality and arousal today; much more appropriate for PO trials. Pts mittens and one restraint removed and pt given lidded cup with a straw. He did not attend to cup to drink it, but did shake it and pass it between his hands often. SLP had to bring the straw to his lips to elicit drinking, which he did without signs of aspiration with honey, nectar or thin. However there was slightly wet vocal quality with thin and pt said "its tickling my throat" suprisingly. He was also able to masticate solids well though occasionally his attention to them drifted mid mastication.  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 Predominant language of confusion, nonsensical rambling unrelated to questions presented to him. He does respond well to prosodic cues that mirror his own statements. Despite frequent orientation statements about name, situation and place he did not absorb any of this and was inaccurate in yes/no questions about his name throughout the session. Did not follow commands, was generally restless and occasionally tried to pulls lines or get out of bed. Will f/u.   HPI HPI: 42 yo male s/p fall TBI with SAH, SDH, R temporal bone, vertical mastoid fx, parietal skull fx intubated 5/31- extubated 6/10 possible ICA dissection, fx sphenoid bones into carotid canals CIWA ETOH + PMH: none      SLP Plan  Continue with current plan of care      Recommendations for follow up therapy are one component of a multi-disciplinary discharge planning process, led by the attending physician.  Recommendations may be updated based on patient status, additional functional criteria and insurance  authorization.    Recommendations  Diet recommendations: Dysphagia 3 (mechanical soft);Nectar-thick liquid Liquids provided via: Straw Medication Administration: Crushed with puree Supervision: Full supervision/cueing for compensatory strategies Compensations: Slow rate;Small sips/bites                  Oral care BID   Frequent or constant Supervision/Assistance Cognitive communication deficit (Y78.295)     Continue with current plan of care     Shawn Mills, Shawn Mills  12/26/2022, 9:57 AM

## 2022-12-26 NOTE — Progress Notes (Signed)
Occupational Therapy Treatment Patient Details Name: Shawn Mills MRN: [id number] DOB: [birth year] Today's Date: 12/26/2022   History of present illness 42 yo male s/p fall TBI with SAH, SDH, R temporal bone, vertical mastoid fx, parietal skull fx intubated 5/31- extubated 6/10 possible ICA dissection, fx sphenoid bones into carotid canals CIWA ETOH + PMH: none   OT comments  Pt with decreased level arousal affecting his ability to participate with OT/PT. Per nsg, Seroquel was increased. Increased agitation at times today with burst of clear language (mostly communicating his current feelings toward working with therapists). More consistent with Rancho level IV. Recommend PMR consult to assist with med management for behavior while allowing to progress with rehab.    Recommendations for follow up therapy are one component of a multi-disciplinary discharge planning process, led by the attending physician.  Recommendations may be updated based on patient status, additional functional criteria and insurance authorization.    Assistance Recommended at Discharge Frequent or constant Supervision/Assistance  Patient can return home with the following  Two people to help with bathing/dressing/bathroom   Equipment Recommendations  Wheelchair (measurements OT);Wheelchair cushion (measurements OT);Hospital bed;Other (comment)    Recommendations for Other Services Rehab consult    Precautions / Restrictions         Mobility Bed Mobility Overal bed mobility: Needs Assistance             General bed mobility comments: total A +2    Transfers Overall transfer level: Needs assistance   Transfers: Sit to/from Stand Sit to Stand: Max assist, +2 physical assistance                 Balance     Sitting balance-Leahy Scale: Poor Sitting balance - Comments: decreased leaning toward R                                   ADL either performed or assessed with clinical  judgement   ADL                                         General ADL Comments: total (A) for all adls    Extremity/Trunk Assessment Upper Extremity Assessment RUE Deficits / Details: spontaneous movement noted RUE; increased propping RUE LUE Deficits / Details: using functionally   Lower Extremity Assessment Lower Extremity Assessment: Defer to PT evaluation        Vision  impaired     Perception  R bias   Praxis      Cognition Arousal/Alertness: Lethargic, Suspect due to medications Behavior During Therapy: Restless, Impulsive Overall Cognitive Status: Impaired/Different from baseline Area of Impairment: Orientation, Attention, Memory, Following commands, Safety/judgement, Awareness, Problem solving, Rancho level               Rancho Levels of Cognitive Functioning Rancho Los Amigos Scales of Cognitive Functioning: Confused/Agitated   Current Attention Level: Focused   Following Commands: Follows one step commands inconsistently   Awareness: Intellectual     Rancho Mir[employer] Scales of Cognitive Functioning: Confused/Agitated Increased agitation at times; verbally inappropriate at times - voice became very clear as well as arousal increasing with changes in posture Pt states he was going to call someone - handed Shawn Mills the phone and initially started to use buttons then most focus and brought phone toward mouth as if  he was bring food to his mouth      Exercises      Shoulder Instructions       General Comments      Pertinent Vitals/ Pain       Pain Assessment Pain Assessment: Faces Faces Pain Scale: No hurt  Home Living                                          Prior Functioning/Environment              Frequency  Min 2X/week        Progress Toward Goals  OT Goals(current goals can now be found in the care plan section)  Progress towards OT goals: Not progressing toward goals - comment (due to  level of arousal)  Acute Rehab OT Goals Patient Stated Goal: per family to get better OT Goal Formulation: Patient unable to participate in goal setting Time For Goal Achievement: 01/02/23 Potential to Achieve Goals: Good ADL Goals Pt Will Perform Grooming: with adaptive equipment;with max assist;sitting Additional ADL Goal #1: pt will visually track from L visual field to R visual field x2 during session Additional ADL Goal #2: pt will sit eob max (A) as precursor to adls. Additional ADL Goal #3: pt will demonstrate following 1 step commands 50% of session  Plan Discharge plan remains appropriate    Co-evaluation    PT/OT/SLP Co-Evaluation/Treatment: Yes Reason for Co-Treatment: Complexity of the patient's impairments (multi-system involvement);Necessary to address cognition/behavior during functional activity;For patient/therapist safety;To address functional/ADL transfers   OT goals addressed during session: ADL's and self-care      AM-PAC OT "6 Clicks" Daily Activity     Outcome Measure   Help from another person eating meals?: Total Help from another person taking care of personal grooming?: Total Help from another person toileting, which includes using toliet, bedpan, or urinal?: Total Help from another person bathing (including washing, rinsing, drying)?: Total Help from another person to put on and taking off regular upper body clothing?: Total Help from another person to put on and taking off regular lower body clothing?: Total 6 Click Score: 6    End of Session    OT Visit Diagnosis: Unsteadiness on feet (R26.81);Muscle weakness (generalized) (M62.81)   Activity Tolerance Patient tolerated treatment well   Patient Left in bed;with call bell/phone within reach;with bed alarm set;with restraints reapplied   Nurse Communication Mobility status;Other (comment) (bed malfunctioning)        Time: 1610-9604 OT Time Calculation (min): 42 min  Charges: OT General  Charges $OT Visit: 1 Visit OT Treatments $Self Care/Home Management : 8-22 mins  Luisa Dago, OT/L   Acute OT Clinical Specialist Acute Rehabilitation Services Pager [phone] Office [phone]   [facility] 12/26/2022, 12:11 PM

## 2022-12-26 NOTE — Consult Note (Signed)
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 male who was admitted on 12/08/2022 after falling backwards while drinking on his front porch.  He was unresponsive on arrival of EMS.  Head CT revealed subarachnoid hemorrhage/subdural hemorrhage and multiple fractures of the skull including a diastatic fracture of the right temporal bone fracture, parietal skull fracture, clival fracture, sphenoid fractures, mastoid fracture on the right, right frontal pneumocephalus.  Repeat CT the following morning suggested A right carotid dissection.  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  There is no evidence of cortical venous reflux and no occlusions shunting or aneurysms or dissections extracranially or intracranially.  81 mg aspirin recommended by neurosurgery and patient placed on Keppra for 7 days for seizure prophylaxis.  Repeat CTA was negative.  EEG was negative.  Neurosurgery and ENT did not recommend operative intervention.  Patient was placed on CIWA for alcohol intoxication.  He was treated for suspected aspiration pneumonia which has since resolved.  His Foley was discontinued on June 11.  He has been upgraded to a D3 nectar liquid diet.  He has had continued issues with agitation and sedation.  He is in bilateral soft wrist restraints.  For behavior he is on Klonopin 0.5 mg 2 times daily as needed, Ativan 0.5 mg every 6 hours as needed.  Additionally he is on propranolol 20 mg 3 times daily and Seroquel 100 mg twice daily.  He is receiving oxycodone for pain 5 mg every 4 hours as needed in addition to fentanyl injection as needed for severe pain.  Review of Systems  Unable to perform ROS: Mental acuity   No past medical history on file.  The  histories are not reviewed yet. Please review them in the "History" navigator section and refresh this SmartLink. No family history on file. Social History:  has no history on file for tobacco use, alcohol use, and drug use. Allergies: No Known Allergies Medications Prior to Admission  Medication Sig Dispense Refill   PRILOSEC OTC 20 MG tablet Take 20 mg by mouth 2 (two) times daily as needed (for heartburn or reflux).     TUMS E-X 750 750 MG chewable tablet Chew 1-2 tablets by mouth 3 (three) times daily as needed for heartburn.      Home: Home Living Family/patient expects to be discharged to:: Private residence Living Arrangements: Spouse/significant other, Other (Comment) (fiance's daughter) Available Help at Discharge: Family, Available 24 hours/day Type of Home: Apartment Home Access: Level entry Home Layout: One level Bathroom Shower/Tub: Engineer, manufacturing systems: Standard Bathroom Accessibility: Yes Home Equipment: Grab bars - tub/shower 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, enjoys working on cars, dog must walk on Psychologist, sport and exercise With: Significant other, Other (Comment) (fiance's daughter)  Functional History: Prior Function Prior Level of Function : Independent/Modified Independent, Driving, Working/employed Functional Status:  Mobility: Bed Mobility Overal bed mobility: Needs Assistance Bed Mobility: Rolling, Sidelying to Sit, Sit to Supine Rolling: Supervision Sidelying to sit: Max assist, +2 for physical assistance Supine to sit: HOB elevated, Max assist, +2 for physical assistance Sit to supine: Mod assist, +2 for safety/equipment Sit to sidelying: Max assist, +2 for physical assistance General bed mobility comments: total A +2 Transfers Overall transfer level: Needs assistance Equipment used: Rolling walker (2 wheels) Transfers: Sit to/from Stand Sit  to Stand: Max assist, +2 physical assistance General transfer comment: assist for initiating placing  hands on RW and for anterior lean, assisting to lift up with pt using LE's to lift; cues for forward gaze out window and assist for balance; stood x 2 Ambulation/Gait Ambulation/Gait assistance: Mod assist, +2 physical assistance Gait Distance (Feet): 1 Feet (x 2) Assistive device: Rolling walker (2 wheels) Gait Pattern/deviations: Step-to pattern, Trunk flexed General Gait Details: side steps to HOB x 2 trials with RW and mod A of 2    ADL: ADL Overall ADL's : Needs assistance/impaired Eating/Feeding: NPO Eating/Feeding Details (indicate cue type and reason): required ice chip placed in mouth, does not open mouth or take it off the spoon with SLp Grooming: Maximal assistance Grooming Details (indicate cue type and reason): wiped mouth using L hand; hand over hand for brushing teeth, automatically spit in trough Functional mobility during ADLs: Maximal assistance, +2 for physical assistance General ADL Comments: total (A) for all adls  Cognition: Cognition Overall Cognitive Status: Impaired/Different from baseline Arousal/Alertness: Lethargic Orientation Level: Oriented to person, Disoriented to place, Disoriented to time, Disoriented to situation Attention: Focused Focused Attention: Impaired Focused Attention Impairment: Verbal basic, Functional basic Rancho Mirant Scales of Cognitive Functioning: Confused/Agitated Cognition Arousal/Alertness: Lethargic, Suspect due to medications Behavior During Therapy: Restless, Impulsive Overall Cognitive Status: Impaired/Different from baseline Area of Impairment: Orientation, Attention, Memory, Following commands, Safety/judgement, Awareness, Problem solving, Rancho level Current Attention Level: Focused Following Commands: Follows one step commands inconsistently Safety/Judgement: Decreased awareness of safety, Decreased awareness of deficits Awareness: Intellectual General Comments: communicating but many times not in response to  questions until full attention gained then still somwhat confabulatory.  Patient restless in bed with PT entered scooting down despite wrist restriants and reaching for rectal tube Difficult to assess due to: Level of arousal  Blood pressure 118/76, pulse 80, temperature 98.6 F (37 C), resp. rate 19, height 5\' 10"  (1.778 m), weight 96.9 kg, SpO2 100 %. Physical Exam Constitutional:      Comments: Pt lethargic. Awakens to his name and tactile stim. Bilateral wrist restraints  HENT:     Head: Normocephalic.     Nose:     Comments: NGT    Mouth/Throat:     Mouth: Mucous membranes are moist.  Cardiovascular:     Rate and Rhythm: Normal rate.  Pulmonary:     Effort: Pulmonary effort is normal.  Abdominal:     Palpations: Abdomen is soft.  Musculoskeletal:        General: No swelling or tenderness.     Cervical back: Neck supple.  Skin:    General: Skin is warm and dry.  Neurological:     Comments: Patient is lethargic but does arouse to tactile and verbal  stim.  Made brief eye contact but generally restless and distracted.  Did utter a few unintelligible words and groans in response to questions. ?aphasia?  Did seem to grasp my hand with his left hand and move his left foot when I asked him to do so.  Did not spontaneously move his right arm or leg however.  Does seem to sense pinch in all 4.  No apparent resting tone appreciated.  Psychiatric:     Comments: Generally lethargic but when awake he is restless and distracted     Results for orders placed or performed during the hospital encounter of 12/08/22 (from the past 24 hour(s))  Vancomycin, trough     Status: Abnormal   Collection Time:  12/25/22  2:41 PM  Result Value Ref Range   Vancomycin Tr 8 (L) 15 - 20 ug/mL  Vancomycin, trough     Status: Abnormal   Collection Time: 12/25/22  2:56 PM  Result Value Ref Range   Vancomycin Tr 8 (L) 15 - 20 ug/mL  Glucose, capillary     Status: Abnormal   Collection Time: 12/25/22  5:07  PM  Result Value Ref Range   Glucose-Capillary 110 (H) 70 - 99 mg/dL  Glucose, capillary     Status: Abnormal   Collection Time: 12/25/22  7:53 PM  Result Value Ref Range   Glucose-Capillary 121 (H) 70 - 99 mg/dL  Glucose, capillary     Status: None   Collection Time: 12/26/22  3:24 AM  Result Value Ref Range   Glucose-Capillary 79 70 - 99 mg/dL  Glucose, capillary     Status: Abnormal   Collection Time: 12/26/22  7:44 AM  Result Value Ref Range   Glucose-Capillary 129 (H) 70 - 99 mg/dL  Glucose, capillary     Status: Abnormal   Collection Time: 12/26/22 11:50 AM  Result Value Ref Range   Glucose-Capillary 104 (H) 70 - 99 mg/dL   No results found.  Assessment/Plan: Diagnosis: 42 year old male status post traumatic left frontal-temporal subarachnoid and subdural hemorrhages with numerous skull fractures on 12/08/2022 after a fall.  Accident was alcohol related. RLAS IV Does the need for close, 24 hr/day medical supervision in concert with the patient's rehab needs make it unreasonable for this patient to be served in a less intensive setting? Potentially Co-Morbidities requiring supervision/potential complications:  -Intermittent sedation and agitated/restless behavior -Likely aspiration pneumonia -Dysphagia Due to bladder management, bowel management, safety, skin/wound care, disease management, medication administration, pain management, and patient education, does the patient require 24 hr/day rehab nursing? Yes Does the patient require coordinated care of a physician, rehab nurse, therapy disciplines of PT, OT, SLP to address physical and functional deficits in the context of the above medical diagnosis(es)? Yes Addressing deficits in the following areas: balance, endurance, locomotion, strength, transferring, bowel/bladder control, bathing, dressing, feeding, grooming, toileting, cognition, speech, swallowing, and psychosocial support Can the patient actively participate in an  intensive therapy program of at least 3 hrs of therapy per day at least 5 days per week? Yes and Potentially The potential for patient to make measurable gains while on inpatient rehab is good Anticipated functional outcomes upon discharge from inpatient rehab are supervision and min assist  with PT, supervision and min assist with OT, supervision and min assist with SLP. Estimated rehab length of stay to reach the above functional goals is: 20-30 days Anticipated discharge destination: Home Overall Rehab/Functional Prognosis: good  POST ACUTE RECOMMENDATIONS: This patient's condition is appropriate for continued rehabilitative care in the following setting: CIR Patient has agreed to participate in recommended program. N/A Note that insurance prior authorization may be required for reimbursement for recommended care.  Comment: Rehab Admissions Coordinator to follow up re: potential rehab admission.   MEDICAL RECOMMENDATIONS: Patient with persistent lethargy  -Limit neuro sedating medication when possible especially fentanyl and ativan which he has been receiving fairly regularly  -Normalize sleep-wake architecture    -Trial of Ritalin 5 mg twice daily @ 0700 and 1200 2. Agitation  -He is already on scheduled Seroquel and propranolol in addition to the as needed benzodiazepines.  -Seroquel was just increased today. We can see how he does on 100mg  bid but I usually favor dosing higher in the PM  and lower in AM to help with sleep cycle, too.  - His BP and HR probably can tolerate further propranolol if needed.   -First, however, I think we can improve his agitation by improving his arousal and awareness of the environment around him. See ritalin above  -he also may have aphasia as well based on my exam and location of his primary brain injury. This will also increase his sense of confusion and agitation.   I have personally performed a face to face diagnostic evaluation of this patient.  Additionally, I have examined the patient's medical record including any pertinent labs and radiographic images. If the physician assistant has documented in this note, I have reviewed and edited or otherwise concur with the physician assistant's documentation.  Thanks,  Ranelle Oyster, MD 12/26/2022

## 2022-12-27 LAB — GLUCOSE, CAPILLARY
Glucose-Capillary: 118 mg/dL — ABNORMAL HIGH (ref 70–99)
Glucose-Capillary: 108 mg/dL — ABNORMAL HIGH (ref 70–99)
Glucose-Capillary: 90 mg/dL (ref 70–99)
Glucose-Capillary: 107 mg/dL — ABNORMAL HIGH (ref 70–99)
Glucose-Capillary: 118 mg/dL — ABNORMAL HIGH (ref 70–99)

## 2022-12-27 LAB — BASIC METABOLIC PANEL
Anion gap: 8 (ref 5–15)
BUN: 21 mg/dL — ABNORMAL HIGH (ref 6–20)
CO2: 25 mmol/L (ref 22–32)
Calcium: 8.9 mg/dL (ref 8.9–10.3)
Chloride: 100 mmol/L (ref 98–111)
Creatinine, Ser: 0.61 mg/dL (ref 0.61–1.24)
GFR, Estimated: >60 mL/min (ref >60)
Glucose, Bld: 84 mg/dL (ref 70–99)
Potassium: 3.9 mmol/L (ref 3.5–5.1)
Sodium: 133 mmol/L — ABNORMAL LOW (ref 135–145)

## 2022-12-27 LAB — CBC
HCT: 36.9 % — ABNORMAL LOW (ref 39.0–52.0)
Hemoglobin: 12.5 g/dL — ABNORMAL LOW (ref 13.0–17.0)
MCH: 33.3 pg (ref 26.0–34.0)
MCHC: 33.9 g/dL (ref 30.0–36.0)
MCV: 98.4 fL (ref 80.0–100.0)
Platelets: 874 10*3/uL — ABNORMAL HIGH (ref 150–400)
RBC: 3.75 MIL/uL — ABNORMAL LOW (ref 4.22–5.81)
RDW: 13 % (ref 11.5–15.5)
WBC: 12.7 10*3/uL — ABNORMAL HIGH (ref 4.0–10.5)
nRBC: 0 % (ref 0.0–0.2)

## 2022-12-27 MED ORDER — PIVOT 1.5 CAL PO LIQD
1000.0000 mL | ORAL | Status: DC
  Administered 2022-12-27 – 2022-12-31 (×5): 1000 mL
  Filled 2022-12-27 (×6): qty 1000

## 2022-12-27 MED ORDER — PROSOURCE TF20 ENFIT COMPATIBL EN LIQD
60.0000 mL | Freq: Two times a day (BID) | ENTERAL | Status: DC
  Administered 2022-12-27 – 2022-12-31 (×8): 60 mL
  Filled 2022-12-27 (×9): qty 60

## 2022-12-27 MED ORDER — BANATROL TF EN LIQD
60.0000 mL | Freq: Three times a day (TID) | ENTERAL | Status: DC
  Administered 2022-12-27 – 2022-12-31 (×13): 60 mL
  Filled 2022-12-27 (×14): qty 60

## 2022-12-27 NOTE — Progress Notes (Signed)
Physical Therapy Treatment Patient Details Name: Shawn Mills MRN: [id number] DOB: [birth year] Today's Date: 12/27/2022   History of Present Illness 42 yo male s/p fall TBI with SAH, SDH, R temporal bone, vertical mastoid fx, parietal skull fx intubated 5/31- extubated 6/10 possible ICA dissection, fx sphenoid bones into carotid canals CIWA ETOH + PMH: none    PT Comments    Patient progressing with cognition with less agitation and mainly impulsivity.  Able to communicate better today indicating he was dizzy prior to returning to supine then that he wanted to sit in the chair.  He had difficulty progressing L LE more than R when taking steps and moved walker too far forward with increasingly flexed posture despite +2 A.  Patient will continue to benefit from skilled PT in the acute setting.  Continue to recommend intensive inpatient rehab at d/c.    Recommendations for follow up therapy are one component of a multi-disciplinary discharge planning process, led by the attending physician.  Recommendations may be updated based on patient status, additional functional criteria and insurance authorization.  Follow Up Recommendations       Assistance Recommended at Discharge Frequent or constant Supervision/Assistance  Patient can return home with the following Two people to help with walking and/or transfers;Assistance with cooking/housework;Assist for transportation;Help with stairs or ramp for entrance;Two people to help with bathing/dressing/bathroom   Equipment Recommendations  Other (comment) (TBA)    Recommendations for Other Services       Precautions / Restrictions Precautions Precautions: Fall Precaution Comments: Mittens, bil wrist restraints, rectal tube, foley, cortrack     Mobility  Bed Mobility Overal bed mobility: Needs Assistance Bed Mobility: Supine to Sit, Sit to Supine     Supine to sit: Min assist, +2 for safety/equipment, Mod assist, HOB elevated Sit to  supine: Min assist, Mod assist, +2 for safety/equipment   General bed mobility comments: assist for up to EOB initially pt initiating and min A for positioning/safety; then after pt returned to supine due to feeling dizzy needing increased assist to come up to sitting    Transfers Overall transfer level: Needs assistance Equipment used: Rolling walker (2 wheels) Transfers: Sit to/from Stand, Bed to chair/wheelchair/BSC Sit to Stand: From elevated surface, Mod assist, +2 physical assistance     Squat pivot transfers: Mod assist     General transfer comment: some lifting help to stand though pt able to initiate from EOB then from chair; back to bed from chair pt pivoting without safety awareness landing on edge of bed    Ambulation/Gait Ambulation/Gait assistance: Mod assist, +2 physical assistance Gait Distance (Feet): 5 Feet Assistive device: Rolling walker (2 wheels) Gait Pattern/deviations: Step-to pattern, Decreased stride length, Decreased step length - left, Wide base of support, Trunk flexed       General Gait Details: taking steps forward away from EOB with RW pt became progressively flexed and pushing RW too far with slower steps on L than R; indicating he wanted to sit in chair rather than step back to EOB but sitting prior to close enough on very edge, able to scoot back once safely on seat   Stairs             Wheelchair Mobility    Modified Rankin (Stroke Patients Only)       Balance Overall balance assessment: Needs assistance   Sitting balance-Leahy Scale: Fair Sitting balance - Comments: on EOB initially with close S then returned to supine on his own due to c/o  dizziness     Standing balance-Leahy Scale: Poor Standing balance comment: UE support and +2 A for balance                            Cognition Arousal/Alertness: Awake/alert Behavior During Therapy: Impulsive, Restless Overall Cognitive Status: Impaired/Different from  baseline Area of Impairment: Orientation, Attention, Memory, Following commands, Safety/judgement, Awareness, Problem solving, Rancho level               Rancho Levels of Cognitive Functioning Rancho Los Amigos Scales of Cognitive Functioning: Confused, Inappropriate Non-Agitated   Current Attention Level: Focused   Following Commands: Follows one step commands inconsistently Safety/Judgement: Decreased awareness of safety, Decreased awareness of deficits Awareness: Intellectual   General Comments: not truly agitated today, but impulsive and unsafe with sitting in chair prior to being close enough.  Also impulsively lying down when attempting up OOB though did respond he was dizzy.  Improved directed communication at times.   Rancho Mirant Scales of Cognitive Functioning: Confused, Inappropriate Non-Agitated    Exercises      General Comments General comments (skin integrity, edema, etc.): VSS, impulsively drinking thickened tea quickly and coughing      Pertinent Vitals/Pain Pain Assessment Faces Pain Scale: No hurt    Home Living                          Prior Function            PT Goals (current goals can now be found in the care plan section) Progress towards PT goals: Progressing toward goals    Frequency    Min 4X/week      PT Plan Current plan remains appropriate    Co-evaluation              AM-PAC PT "6 Clicks" Mobility   Outcome Measure  Help needed turning from your back to your side while in a flat bed without using bedrails?: A Little Help needed moving from lying on your back to sitting on the side of a flat bed without using bedrails?: A Little Help needed moving to and from a bed to a chair (including a wheelchair)?: A Lot Help needed standing up from a chair using your arms (e.g., wheelchair or bedside chair)?: Total Help needed to walk in hospital room?: Total Help needed climbing 3-5 steps with a railing? :  Total 6 Click Score: 11    End of Session Equipment Utilized During Treatment: Gait belt Activity Tolerance: Patient limited by fatigue Patient left: in bed;with call bell/phone within reach;with bed alarm set;with restraints reapplied   PT Visit Diagnosis: Other abnormalities of gait and mobility (R26.89);Muscle weakness (generalized) (M62.81);Other symptoms and signs involving the nervous system (R29.898)     Time: 6213-0865 PT Time Calculation (min) (ACUTE ONLY): 26 min  Charges:  $Gait Training: 8-22 mins $Therapeutic Activity: 8-22 mins                     Sheran Lawless, PT Acute Rehabilitation Services [phone] 12/27/2022    [name] 12/27/2022, 6:15 PM

## 2022-12-27 NOTE — Progress Notes (Signed)
Nutrition Follow-up  DOCUMENTATION CODES:   Not applicable  INTERVENTION:   Transition to nocturnal tube feeds via Cortrak: - Pivot 1.5 @ 83 ml/hr x 12 hours from 1800 to 0600 (total of 996 ml) - PROSource TF20 60 ml BID  Nocturnal tube feeding regimen provides 1654 kcal, 133 grams of protein, and 747 ml of H2O (meets 69% of minimum kcal needs and 100% of minimum protein needs).  - Continue MVI with minerals, thiamine, and folic acid daily per tube  - Increase Banatrol TF 60 ml to TID per tube as pt is still having type 7 bowel movements with FMS in place  - Trial Mighty Shake TID with meals, each supplement provides 330 kcals and 9 grams of protein  NUTRITION DIAGNOSIS:   Increased nutrient needs related to (TBI) as evidenced by estimated needs.  Ongoing, being addressed via tube feeds, diet advancement, and oral nutrition supplements  GOAL:   Patient will meet greater than or equal to 90% of their needs  Progressing  MONITOR:   PO intake, Supplement acceptance, Diet advancement, Weight trends, TF tolerance, I & O's  REASON FOR ASSESSMENT:   Consult Enteral/tube feeding initiation and management  ASSESSMENT:   Pt admitted after fall with TBI with SAH, SDH, R temporal bone fx, R parietal fx, possible ICA dissection, R mastoid fx, fx through spenhoids, vertical mastoid fx.  06/03 - Cortrak placed (no bridle per MD) 06/06 - Cortrak tube became dislodged, replaced with NG tube 06/10 - extubated 06/12 - Cortrak + bridle placed (tip distal stomach or proximal duodenum) 06/18 - diet advanced to dysphagia 3 with nectar-thick liquids  Consult received to transition pt to nocturnal tube feeds. Discussed pt with RN. Pt requires feeding assistance but does well when he receives that. RD to trial Mighty Shakes with meal trays to maximize kcal and protein intake via PO route. Discussed transition to nocturnal tube feeds via pt. Discussed importance of increasing PO intake in  order for Cortrak to be removed.  Admit weight: 100 kg Current weight: 96.9 kg  Current TF: Pivot 1.5 @ 70 ml/hr  Medications reviewed and include: Banatrol TF 60 ml BID, folic acid, ritalin, MVI with minerals, thiamine, IV abx  Labs reviewed: sodium 133, BUN 21, WBC 12.7 CBG's: 90-135 x 24 hours  UOP: 1850 ml x 24 hours  Diet Order:   Diet Order             DIET DYS 3 Room service appropriate? No; Fluid consistency: Nectar Thick  Diet effective now                   EDUCATION NEEDS:   Not appropriate for education at this time  Skin:  Skin Assessment: Reviewed RN Assessment  Last BM:  12/27/22 rectal tube  Height:   Ht Readings from Last 1 Encounters:  12/13/22 5\' 10"  (1.778 m)    Weight:   Wt Readings from Last 1 Encounters:  12/27/22 96.9 kg    BMI:  Body mass index is 30.65 kg/m.  Estimated Nutritional Needs:   Kcal:  2400-2600  Protein:  125-145 grams  Fluid:  >2L/day    Mertie Clause, MS, RD, LDN Inpatient Clinical Dietitian Please see [facility] for contact information.

## 2022-12-27 NOTE — Progress Notes (Signed)
Subjective: CC: Alert, fidgeting in bed. Talking to me. Intermittently answering questions appropraitely   Objective: Vital signs in last 24 hours: Temp:  [97.9 F (36.6 C)-98.1 F (36.7 C)] 97.9 F (36.6 C) (06/19 0742) Pulse Rate:  [87-93] 90 (06/19 0742) Resp:  [17-23] 20 (06/19 0742) BP: (104-125)/(70-85) 125/74 (06/19 0742) SpO2:  [99 %-100 %] 100 % (06/19 0742) Weight:  [96.9 kg] 96.9 kg (06/19 0500) Last BM Date : 12/26/22  Intake/Output from previous day: 06/18 0701 - 06/19 0700 In: 2182.7 [NG/GT:1582.7; IV Piggyback:600] Out: 1850 [Urine:1850] Intake/Output this shift: No intake/output data recorded.  PE: Gen:  Alert, NAD Card:  Reg Pulm:  normal effort Abd: Soft, ND, NT ,No rigidity or guarding. +BS GU: condom cath with straw colored urine, rectal tube with liquid nonbloody stool Ext:  No LE edema  Neuro:Moving all extremities spontaneously, intermittently FC   Lab Results:  Recent Labs    12/25/22 0812 12/27/22 0355  WBC 14.0* 12.7*  HGB 12.2* 12.5*  HCT 35.5* 36.9*  PLT 827* 874*   BMET Recent Labs    12/25/22 0812 12/27/22 0355  NA 133* 133*  K 4.1 3.9  CL 102 100  CO2 19* 25  GLUCOSE 115* 84  BUN 21* 21*  CREATININE 0.57* 0.61  CALCIUM 8.6* 8.9    PT/INR No results for input(s): "LABPROT", "INR" in the last 72 hours. CMP     Component Value Date/Time   NA 133 (L) 12/27/2022 0355   K 3.9 12/27/2022 0355   CL 100 12/27/2022 0355   CO2 25 12/27/2022 0355   GLUCOSE 84 12/27/2022 0355   BUN 21 (H) 12/27/2022 0355   CREATININE 0.61 12/27/2022 0355   CALCIUM 8.9 12/27/2022 0355   PROT 7.0 12/25/2022 0812   ALBUMIN 2.6 (L) 12/25/2022 0812   AST 34 12/25/2022 0812   ALT 74 (H) 12/25/2022 0812   ALKPHOS 107 12/25/2022 0812   BILITOT 0.4 12/25/2022 0812   GFRNONAA >60 12/27/2022 0355   Lipase  No results found for: "LIPASE"  Studies/Results: No results found.  Anti-infectives: Anti-infectives (From admission, onward)     Start     Dose/Rate Route Frequency Ordered Stop   12/26/22 2200  vancomycin (VANCOREADY) IVPB 1500 mg/300 mL        1,500 mg 150 mL/hr over 120 Minutes Intravenous Every 8 hours 12/25/22 1653 12/31/22 2159   12/24/22 0200  vancomycin (VANCOREADY) IVPB 1250 mg/250 mL  Status:  Discontinued        1,250 mg 166.7 mL/hr over 90 Minutes Intravenous Every 12 hours 12/23/22 1342 12/25/22 1653   12/23/22 1430  vancomycin (VANCOREADY) IVPB 2000 mg/400 mL        2,000 mg 200 mL/hr over 120 Minutes Intravenous  Once 12/23/22 1342 12/23/22 1641   12/22/22 1100  ceFEPIme (MAXIPIME) 2 g in sodium chloride 0.9 % 100 mL IVPB        2 g 200 mL/hr over 30 Minutes Intravenous Every 8 hours 12/22/22 0949 12/29/22 2359   12/11/22 1115  cefTRIAXone (ROCEPHIN) 2 g in sodium chloride 0.9 % 100 mL IVPB        2 g 200 mL/hr over 30 Minutes Intravenous Every 24 hours 12/11/22 1020 12/15/22 1117        Assessment/Plan S/p fall TBI with SAH, SDH - NSGY c/s, Dr. Franky Macho, monitor neuro exam, keppra x7d for sz ppx. Repeat CT head 6/1 stable. Repeat CTA head/neck 6/5. EEG negative 6/5. F/C at  times now CV - added scheduled propranolol 6/12 with TBI, tachy, HTN - improved.  Agitation/Sedation:  more alert and agitated. Increase seroquel to 100 mg BID 6/18, klonopin 0.5 mg BID PRN. Continue IV ativan PRN. D/C fentanyl Acute hypoxic respiratory failure:  extubated 6/10, CXR now for fever R temporal bone/mastoid fx, parietal skull fx - NSGY c/s, Dr. Franky Macho, ENT Jearld Fenton) recs audiogram once extubated. No operative intervention required at this point.  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 ASA 81 per Dr. Franky Macho. Fx thru sphenoid bones into carotid canals; vertical mastoid fx Alcohol intoxication - CIWA, TOC c/s. Thiamine, folate, multi.  Mild elevated transaminases  Suspected aspiration PNA, resolved- resp cx  12/11/22  H flu, ceftriaxone x5d (completed 6/7) ID/Fever - Rocephin 6/3 - 6/7 for PNA. Febrile intermittently for 72h. CXR without consolidation or effusion. UA negative. Resp CX MRSA. Cefepime 6/14 >>, vanc 6/15 >> plan for a total of 7 d abx (6/21) C.dif was negative. Foley - d/c 6/11, good UOP FEN - DYS 3/nectar started 6/18, TF nocturnal  DVT - SCDs, LMWH Dispo - 4NP, therapies - CIR following  Ritalin trial per Dr. Riley Kill. I D/C-ed fentanyl.    I reviewed nursing notes, last 24 h vitals and pain scores, last 48 h intake and output, last 24 h labs and trends, and last 24 h imaging results. Discussed case with ID.    LOS: 19 days    Adam Phenix , [facility] 12/27/2022, 8:02 AM Please see Amion for pager number during day hours 7:00am-4:30pm

## 2022-12-27 NOTE — Progress Notes (Signed)
Speech Language Pathology Treatment: Dysphagia;Cognitive-Linquistic  Patient Details Name: Shawn Mills MRN: [id number] DOB: Jun 08, 1981 Today's Date: 12/27/2022 Time: 4098-1191 SLP Time Calculation (min) (ACUTE ONLY): 15 min  Assessment / Plan / Recommendation Clinical Impression  Pt demonstrates improved attention today, some verbalizations are meaningful, though most are language of confusion and off topic. Behavior most consistent with Rancho V (confused, inappropriate, nonagitated). Very flat affect. Pt does follow commands consistently and allowed restraints on and off without resistance. Pt able to problem solve basic functional tasks with max verbal, visual and question cues. Tolerating sips of thin with one cough, which PT also reported with nectar. Will wait a few more days for upgrade to thin. Overall tolerating well though needs assist with meals.   HPI HPI: 42 yo male s/p fall TBI with SAH, SDH, R temporal bone, vertical mastoid fx, parietal skull fx intubated 5/31- extubated 6/10 possible ICA dissection, fx sphenoid bones into carotid canals CIWA ETOH + PMH: none      SLP Plan  Continue with current plan of care      Recommendations for follow up therapy are one component of a multi-disciplinary discharge planning process, led by the attending physician.  Recommendations may be updated based on patient status, additional functional criteria and insurance authorization.    Recommendations  Diet recommendations: Dysphagia 3 (mechanical soft);Nectar-thick liquid Liquids provided via: Straw Medication Administration: Crushed with puree Supervision: Full supervision/cueing for compensatory strategies Compensations: Slow rate;Small sips/bites                      Frequent or constant Supervision/Assistance Cognitive communication deficit (Y78.295)     Continue with current plan of care     Shawn Mills, Riley Nearing  12/27/2022, 2:34 PM

## 2022-12-28 LAB — GLUCOSE, CAPILLARY
Glucose-Capillary: 104 mg/dL — ABNORMAL HIGH (ref 70–99)
Glucose-Capillary: 112 mg/dL — ABNORMAL HIGH (ref 70–99)
Glucose-Capillary: 123 mg/dL — ABNORMAL HIGH (ref 70–99)
Glucose-Capillary: 129 mg/dL — ABNORMAL HIGH (ref 70–99)
Glucose-Capillary: 134 mg/dL — ABNORMAL HIGH (ref 70–99)
Glucose-Capillary: 69 mg/dL — ABNORMAL LOW (ref 70–99)
Glucose-Capillary: 88 mg/dL (ref 70–99)
Glucose-Capillary: 94 mg/dL (ref 70–99)

## 2022-12-28 NOTE — Progress Notes (Signed)
Occupational Therapy Treatment Patient Details Name: Dshaun Deets MRN: [id number] DOB: 1980/11/28 Today's Date: 12/28/2022   History of present illness 42 yo male s/p fall TBI with SAH, SDH, R temporal bone, vertical mastoid fx, parietal skull fx intubated 5/31- extubated 6/10 possible ICA dissection, fx sphenoid bones into carotid canals CIWA ETOH + PMH: none   OT comments  Pt asleep upon therapy arrival although easily awakened. Sleepy initially although once sitting on EOB, pt was became more alert with increased time. Pt talking during session, although nonsensical the majority of the time. Agitated with therapist when attempting to re-direct him from pulling on lines and tubes. Limited attention demonstrated while able to attend to task for 5-10 seconds at a time. Increased time needed to re-direct and regain attention. Deck of cards present in room and used during session. Pt did not participate in number identification/recall task. At end of session, once returned supine in bed with restraints and alarms on, [employer] music turned on and pt lip synced along with song. Continue to recommend inpatient follow up therapy, >3 hours/day. OT will continue to follow patient acutely.     Recommendations for follow up therapy are one component of a multi-disciplinary discharge planning process, led by the attending physician.  Recommendations may be updated based on patient status, additional functional criteria and insurance authorization.    Assistance Recommended at Discharge Frequent or constant Supervision/Assistance  Patient can return home with the following  Two people to help with bathing/dressing/bathroom;Two people to help with walking and/or transfers;Assistance with feeding;Assistance with cooking/housework;Assist for transportation;Direct supervision/assist for financial management;Direct supervision/assist for medications management;Help with stairs or ramp for entrance    Equipment Recommendations  Wheelchair (measurements OT);Wheelchair cushion (measurements OT);Hospital bed;Other (comment)    Recommendations for Other Services Rehab consult    Precautions / Restrictions Precautions Precautions: Fall Precaution Comments: Mittens, bil wrist restraints, foley, cortrack Restrictions Weight Bearing Restrictions: No       Mobility Bed Mobility Overal bed mobility: Needs Assistance Bed Mobility: Supine to Sit, Sit to Supine     Supine to sit: +2 for physical assistance, HOB elevated, Mod assist Sit to supine: +2 for physical assistance, Mod assist   General bed mobility comments: Pt requiring physical assist d/t lack of task initiation and resistance. Patient Response: Restless, Flat affect  Transfers Overall transfer level: Needs assistance Equipment used: Rolling walker (2 wheels) Transfers: Sit to/from Stand Sit to Stand: From elevated surface, Mod assist, +2 physical assistance           General transfer comment: VC and physical assist to initiate power up into standing. Required total assist for set-up of hand on RW handles. Manual faciliation provided to low back and posterior hips to bring pelvis forward and allow pt to achieve an upright standing posture. Total assist needed for RW management during lateral side steps towards the right/HOB. VC provided for sequencing and direction.     Balance Overall balance assessment: Needs assistance Sitting-balance support: Feet supported Sitting balance-Leahy Scale: Poor Sitting balance - Comments: intermittent use of UEs to maintain balance. Required posterior support due to right lateral lean and attempts to lay down while seated EOB. Postural control: Right lateral lean Standing balance support: Bilateral upper extremity supported, Reliant on assistive device for balance, During functional activity Standing balance-Leahy Scale: Poor        ADL either performed or assessed with clinical  judgement   ADL       Grooming: Total assistance;Bed level;Wash/dry face Grooming  Details (indicate cue type and reason): Would not participate in washing face when presented with washcloth. Placed washcloth in either hand without any initiation.             Lower Body Dressing: Total assistance;Bed level Lower Body Dressing Details (indicate cue type and reason): non skid socks donne while seated EOB. Multimodel cueing used to engage pt in participating in task.           Extremity/Trunk Assessment Upper Extremity Assessment RUE Deficits / Details: Demonstrates functional strength and use during session LUE Deficits / Details: Demonstrates functional strength and use during session            Vision   Vision Assessment?: Vision impaired- to be further tested in functional context Additional Comments: Would not visually track downward towards feet when cued during sock management. Able to visually attend to therapist intermittently. Required playing card to be placed directly in line of sight before he would attend versus looking down when card was on tray table.   Perception Perception Perception:  (poor midline awareness)       Cognition Arousal/Alertness: Awake/alert Behavior During Therapy: Impulsive, Restless, Agitated Overall Cognitive Status: Impaired/Different from baseline Area of Impairment: Orientation, Attention, Memory, Following commands, Safety/judgement, Awareness, Problem solving, Rancho level               Rancho Levels of Cognitive Functioning Rancho Los Amigos Scales of Cognitive Functioning: Confused/Agitated Orientation Level: Disoriented to, Place, Time, Situation Current Attention Level: Focused Memory: Decreased short-term memory, Decreased recall of precautions Following Commands:  (Did not follow one step commands without physical assist to initiate.) Safety/Judgement: Decreased awareness of safety, Decreased awareness of  deficits Awareness: Intellectual Problem Solving: Difficulty sequencing, Requires verbal cues, Requires tactile cues, Decreased initiation, Slow processing General Comments: Was distracted and agitated with telemonitor lines, cortrak, wrist restraints and mitts. Constantly picking and pulling at items. Would not abide to therapist's re-direction from pulling on purewick, cortrak, or lines which would make him agitated. Very easily distracted. Able to attend for ~5-10 seconds before becoming distracted by something else. Rancho Mirant Scales of Cognitive Functioning: Confused/Agitated            General Comments VSS    Pertinent Vitals/ Pain       Pain Assessment Pain Assessment: Faces Faces Pain Scale: No hurt         Frequency  Min 2X/week        Progress Toward Goals  OT Goals(current goals can now be found in the care plan section)  Progress towards OT goals: Not progressing toward goals - comment (cognition/agitation effecting progress)     Plan Discharge plan remains appropriate;Frequency remains appropriate    Co-evaluation    PT/OT/SLP Co-Evaluation/Treatment: Yes Reason for Co-Treatment: Complexity of the patient's impairments (multi-system involvement);Necessary to address cognition/behavior during functional activity;For patient/therapist safety;To address functional/ADL transfers   OT goals addressed during session: ADL's and self-care;Other (comment) (cognition)      AM-PAC OT "6 Clicks" Daily Activity     Outcome Measure   Help from another person eating meals?: Total Help from another person taking care of personal grooming?: Total Help from another person toileting, which includes using toliet, bedpan, or urinal?: Total Help from another person bathing (including washing, rinsing, drying)?: Total Help from another person to put on and taking off regular upper body clothing?: Total Help from another person to put on and taking off regular lower  body clothing?: Total 6 Click Score: 6  End of Session Equipment Utilized During Treatment: Gait belt;Rolling walker (2 wheels)  OT Visit Diagnosis: Unsteadiness on feet (R26.81);Muscle weakness (generalized) (M62.81)   Activity Tolerance Patient tolerated treatment well;Treatment limited secondary to agitation   Patient Left in bed;with call bell/phone within reach;with bed alarm set;with restraints reapplied           Time: 9528-4132 OT Time Calculation (min): 38 min  Charges: OT General Charges $OT Visit: 1 Visit OT Treatments $Therapeutic Activity: 8-22 mins  Limmie Patricia, OTR/L,CBIS  Supplemental OT - [facility] and [facility] Secure Chat Preferred    Briar Witherspoon, Charisse March 12/28/2022, 12:49 PM

## 2022-12-28 NOTE — Progress Notes (Signed)
Pt was found by nurse tech on floor by bedside one hand restraint still in place  Pt alert to self no seen head body injuries noted. Pt had one vomiting occurrence after incident MD was in\formed orders stop nocturnal feeding. Will continue to monitor pt.

## 2022-12-28 NOTE — Progress Notes (Signed)
Physical Therapy Treatment Patient Details Name: Shawn Mills MRN: [id number] DOB: 1981/03/06 Today's Date: 12/28/2022   History of Present Illness 42 yo male s/p fall TBI with SAH, SDH, R temporal bone, vertical mastoid fx, parietal skull fx intubated 5/31- extubated 6/10 possible ICA dissection, fx sphenoid bones into carotid canals CIWA ETOH + PMH: none    PT Comments    Patient more consistent with Ranchos level IV this session with cognitive recovery.  Very short attention and highly distracted by lines/restraints/picking at his skin.  Able to focus for few seconds on card and on mobility though giving cues for each side step toward HOB and max cues and assist to maintain upright posture and to keep from pulling off telemetry wires and coretrack.  Patient cussing at OT when continuing to keep him from pulling on lines throughout session.  Patient remains appropriate for intensive inpatient rehab.  PT will continue to follow in acute setting.   Recommendations for follow up therapy are one component of a multi-disciplinary discharge planning process, led by the attending physician.  Recommendations may be updated based on patient status, additional functional criteria and insurance authorization.  Follow Up Recommendations       Assistance Recommended at Discharge Frequent or constant Supervision/Assistance  Patient can return home with the following Two people to help with walking and/or transfers;Assistance with cooking/housework;Assist for transportation;Help with stairs or ramp for entrance;Two people to help with bathing/dressing/bathroom   Equipment Recommendations  Other (comment) (TBA)    Recommendations for Other Services       Precautions / Restrictions Precautions Precautions: Fall Precaution Comments: Mittens, bil wrist restraints, foley, cortrack Restrictions Weight Bearing Restrictions: No     Mobility  Bed Mobility Overal bed mobility: Needs Assistance Bed  Mobility: Supine to Sit, Sit to Supine     Supine to sit: +2 for physical assistance, HOB elevated, Mod assist Sit to supine: +2 for physical assistance, Mod assist   General bed mobility comments: Pt requiring physical assist d/t lack of task initiation and resistance.    Transfers   Equipment used: Rolling walker (2 wheels) Transfers: Sit to/from Stand Sit to Stand: From elevated surface, Mod assist, +2 physical assistance           General transfer comment: VC and physical assist to initiate power up into standing. Required total assist for set-up of hand on RW handles. Manual faciliation provided to low back and posterior hips to bring pelvis forward and allow pt to achieve an upright standing posture.    Ambulation/Gait Ambulation/Gait assistance: Mod assist, +2 physical assistance Gait Distance (Feet): 2 Feet Assistive device: Rolling walker (2 wheels) Gait Pattern/deviations: Step-to pattern, Decreased stride length, Decreased step length - left, Wide base of support       General Gait Details: Total assist needed for RW management during lateral side steps towards the right/HOB. VC provided for sequencing each step and for direction.   Stairs             Wheelchair Mobility    Modified Rankin (Stroke Patients Only)       Balance Overall balance assessment: Needs assistance   Sitting balance-Leahy Scale: Poor Sitting balance - Comments: intermittent use of UEs to maintain balance. Required posterior support due to right lateral lean and attempts to lay down while seated EOB.   Standing balance support: Bilateral upper extremity supported, Reliant on assistive device for balance, During functional activity Standing balance-Leahy Scale: Poor Standing balance comment: UE support and +2  A for balance                            Cognition Arousal/Alertness: Awake/alert Behavior During Therapy: Impulsive, Agitated, Restless Overall Cognitive  Status: Impaired/Different from baseline Area of Impairment: Orientation, Attention, Memory, Following commands, Safety/judgement, Awareness, Problem solving, Rancho level               Rancho Levels of Cognitive Functioning Rancho Los Amigos Scales of Cognitive Functioning: Confused/Agitated Orientation Level: Disoriented to, Place, Time, Situation Current Attention Level: Focused Memory: Decreased short-term memory, Decreased recall of precautions Following Commands: Follows one step commands inconsistently, Follows one step commands with increased time (and with multimodal cues) Safety/Judgement: Decreased awareness of safety, Decreased awareness of deficits Awareness: Intellectual Problem Solving: Difficulty sequencing, Requires verbal cues, Requires tactile cues, Decreased initiation, Slow processing General Comments: Was distracted and agitated with telemonitor lines, cortrak, wrist restraints and mitts. Constantly picking and pulling at items. Would not abide to therapist's re-direction from pulling on purewick, cortrak, or lines which would make him agitated. Very easily distracted. Able to attend for ~5-10 seconds before becoming distracted by something else.   Rancho Mirant Scales of Cognitive Functioning: Confused/Agitated    Exercises      General Comments General comments (skin integrity, edema, etc.): VSS      Pertinent Vitals/Pain Pain Assessment Pain Assessment: Faces Faces Pain Scale: No hurt    Home Living                          Prior Function            PT Goals (current goals can now be found in the care plan section) Progress towards PT goals: Progressing toward goals    Frequency    Min 4X/week      PT Plan Current plan remains appropriate    Co-evaluation PT/OT/SLP Co-Evaluation/Treatment: Yes Reason for Co-Treatment: Complexity of the patient's impairments (multi-system involvement);Necessary to address  cognition/behavior during functional activity;For patient/therapist safety;To address functional/ADL transfers PT goals addressed during session: Mobility/safety with mobility;Balance (cognition) OT goals addressed during session: Other (comment)      AM-PAC PT "6 Clicks" Mobility   Outcome Measure  Help needed turning from your back to your side while in a flat bed without using bedrails?: A Lot Help needed moving from lying on your back to sitting on the side of a flat bed without using bedrails?: A Lot Help needed moving to and from a bed to a chair (including a wheelchair)?: Total Help needed standing up from a chair using your arms (e.g., wheelchair or bedside chair)?: Total Help needed to walk in hospital room?: Total Help needed climbing 3-5 steps with a railing? : Total 6 Click Score: 8    End of Session Equipment Utilized During Treatment: Gait belt Activity Tolerance: Patient limited by fatigue;Treatment limited secondary to agitation Patient left: in bed;with call bell/phone within reach;with restraints reapplied;with bed alarm set   PT Visit Diagnosis: Other abnormalities of gait and mobility (R26.89);Muscle weakness (generalized) (M62.81);Other symptoms and signs involving the nervous system (R29.898)     Time: 1610-9604 PT Time Calculation (min) (ACUTE ONLY): 31 min  Charges:  $Therapeutic Activity: 8-22 mins                     Sheran Lawless, PT Acute Rehabilitation Services Office:[phone] 12/28/2022    [name] 12/28/2022, 1:14 PM

## 2022-12-28 NOTE — Progress Notes (Signed)
   12/28/22 2314  What Happened  Was fall witnessed? No  Was patient injured? No  Patient found on floor  Found by Staff-comment  Stated prior activity  (hand restraints)  Provider Notification  Provider Name/Title Dr Violeta Gelinas  Date Provider Notified 12/28/22  Time Provider Notified 0930  Method of Notification Call  Notification Reason Fall  Provider response No new orders  Date of Provider Response 12/28/22  Time of Provider Response 2201  Adult Fall Risk Assessment  Risk Factor Category (scoring not indicated) High fall risk per protocol (document High fall risk)  Age 42  Fall History: Fall within 6 months prior to admission 5  Elimination; Bowel and/or Urine Incontinence 2  Elimination; Bowel and/or Urine Urgency/Frequency 2  Medications: includes PCA/Opiates, Anti-convulsants, Anti-hypertensives, Diuretics, Hypnotics, Laxatives, Sedatives, and Psychotropics 5  Patient Care Equipment 2  Mobility-Assistance 2  Mobility-Gait 2  Mobility-Sensory Deficit 0  Altered awareness of immediate physical environment 1  Impulsiveness 2  Lack of understanding of one's physical/cognitive limitations 4  Total Score 27  Patient Fall Risk Level High fall risk  Adult Fall Risk Interventions  Required Bundle Interventions *See Row Information* High fall risk - low, moderate, and high requirements implemented  Vitals  Temp 98.4 F (36.9 C)  Temp Source Oral  BP (!) 132/92  MAP (mmHg) 101  BP Location Right Arm  BP Method Automatic  Patient Position (if appropriate) Lying  Pulse Rate 92  Pulse Rate Source Monitor  ECG Heart Rate 93  Resp 19  Oxygen Therapy  SpO2 100 %  O2 Device Room Air

## 2022-12-28 NOTE — Progress Notes (Signed)
Trauma/Critical Care Follow Up Note  Subjective:    Overnight Issues:   Objective:  Vital signs for last 24 hours: Temp:  [98.4 F (36.9 C)-99 F (37.2 C)] 98.5 F (36.9 C) (06/20 1155) Pulse Rate:  [78-91] 85 (06/20 1155) Resp:  [19-24] 19 (06/20 1155) BP: (115-123)/(77-96) 123/82 (06/20 1155) SpO2:  [97 %-100 %] 100 % (06/20 1155) Weight:  [93 kg] 93 kg (06/20 1000)  Hemodynamic parameters for last 24 hours:    Intake/Output from previous day: 06/19 0701 - 06/20 0700 In: 480 [P.O.:360; NG/GT:120] Out: 2300 [Urine:2300]  Intake/Output this shift: Total I/O In: 480 [P.O.:420; NG/GT:60] Out: 200 [Urine:200]  Vent settings for last 24 hours:    Physical Exam:  Gen: comfortable, no distress Neuro: follows commands, alert, communicative HEENT: PERRL Neck: supple CV: RRR Pulm: unlabored breathing on RA Abd: soft, NT    GU: urine clear and yellow, +spontaneous voids Extr: wwp, no edema  Results for orders placed or performed during the hospital encounter of 12/08/22 (from the past 24 hour(s))  Glucose, capillary     Status: Abnormal   Collection Time: 12/27/22  3:36 PM  Result Value Ref Range   Glucose-Capillary 107 (H) 70 - 99 mg/dL  Glucose, capillary     Status: Abnormal   Collection Time: 12/27/22  8:28 PM  Result Value Ref Range   Glucose-Capillary 118 (H) 70 - 99 mg/dL  Glucose, capillary     Status: Abnormal   Collection Time: 12/27/22 11:21 PM  Result Value Ref Range   Glucose-Capillary 69 (L) 70 - 99 mg/dL  Glucose, capillary     Status: Abnormal   Collection Time: 12/28/22 12:22 AM  Result Value Ref Range   Glucose-Capillary 134 (H) 70 - 99 mg/dL  Glucose, capillary     Status: Abnormal   Collection Time: 12/28/22  3:40 AM  Result Value Ref Range   Glucose-Capillary 112 (H) 70 - 99 mg/dL  Glucose, capillary     Status: None   Collection Time: 12/28/22  7:26 AM  Result Value Ref Range   Glucose-Capillary 88 70 - 99 mg/dL  Glucose, capillary      Status: Abnormal   Collection Time: 12/28/22 11:52 AM  Result Value Ref Range   Glucose-Capillary 129 (H) 70 - 99 mg/dL    Assessment & Plan:  Present on Admission:  TBI (traumatic brain injury) (HCC)    LOS: 20 days   Additional comments:I reviewed the patient's new clinical lab test results.   and I reviewed the patients new imaging test results.    S/p fall  TBI with SAH, SDH - NSGY c/s, Dr. Franky Macho, monitor neuro exam, keppra x7d for sz ppx. Repeat CT head 6/1 stable. Repeat CTA head/neck 6/5. EEG negative 6/5. F/C at times now CV - added scheduled propranolol 6/12 with TBI, tachy, HTN - improved.  Agitation/Sedation:  more alert and agitated. Increase seroquel to 100 mg BID 6/18, klonopin 0.5 mg BID PRN. Continue IV ativan PRN. D/C fentanyl Acute hypoxic respiratory failure:  extubated 6/10, CXR now for fever R temporal bone/mastoid fx, parietal skull fx - NSGY c/s, Dr. Franky Macho, ENT Jearld Fenton) recs audiogram once extubated. No operative intervention required at this point.  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 ASA 81 per Dr. Franky Macho. Fx thru sphenoid bones into carotid canals; vertical mastoid fx Alcohol intoxication - CIWA, TOC c/s. Thiamine, folate, multi.  Mild elevated transaminases  Suspected aspiration PNA, resolved- resp cx 12/11/22  H flu, ceftriaxone x5d (completed 6/7) ID/Fever - Rocephin 6/3 - 6/7 for PNA. Febrile intermittently for 72h. CXR without consolidation or effusion. UA negative. Resp CX MRSA. Cefepime 6/14 >>, vanc 6/15 >> plan for a total of 7 d abx (6/21) C.dif was negative. Foley - d/c 6/11, good UOP FEN - DYS 3/nectar started 6/18, TF nocturnal  DVT - SCDs, LMWH Dispo - 4NP, therapies - CIR following  Ritalin trial per Dr. Riley Kill.  Diamantina Monks, MD Trauma & General Surgery Please use [email or web address] to contact on call provider  12/28/2022  *Care  during the described time interval was provided by me. I have reviewed this patient's available data, including medical history, events of note, physical examination and test results as part of my evaluation.

## 2022-12-28 NOTE — Progress Notes (Signed)
Hypoglycemic Event  CBG: 69  Treatment: 8 oz juice/soda  Symptoms: None  Follow-up CBG: Time:0022 CBG Result:132  Possible Reasons for Event: Inadequate meal intake  Comments/MD notified:yes    Hermine Messick

## 2022-12-29 ENCOUNTER — Inpatient Hospital Stay (HOSPITAL_COMMUNITY): Payer: Medicaid Other

## 2022-12-29 LAB — GLUCOSE, CAPILLARY
Glucose-Capillary: 107 mg/dL — ABNORMAL HIGH (ref 70–99)
Glucose-Capillary: 102 mg/dL — ABNORMAL HIGH (ref 70–99)
Glucose-Capillary: 121 mg/dL — ABNORMAL HIGH (ref 70–99)
Glucose-Capillary: 118 mg/dL — ABNORMAL HIGH (ref 70–99)
Glucose-Capillary: 117 mg/dL — ABNORMAL HIGH (ref 70–99)
Glucose-Capillary: 104 mg/dL — ABNORMAL HIGH (ref 70–99)

## 2022-12-29 MED ORDER — QUETIAPINE FUMARATE 100 MG PO TABS
100.0000 mg | ORAL_TABLET | Freq: Every morning | ORAL | Status: DC
  Administered 2022-12-29 – 2023-01-04 (×7): 100 mg via ORAL
  Filled 2022-12-29 (×7): qty 1

## 2022-12-29 MED ORDER — LORAZEPAM 2 MG/ML IJ SOLN: 0.5000 mg | INTRAMUSCULAR | Status: DC

## 2022-12-29 MED ORDER — METHYLPHENIDATE HCL 5 MG PO TABS
10.0000 mg | ORAL_TABLET | Freq: Two times a day (BID) | ORAL | Status: DC
  Filled 2022-12-29: qty 2

## 2022-12-29 MED ORDER — QUETIAPINE FUMARATE 100 MG PO TABS
200.0000 mg | ORAL_TABLET | Freq: Every day | ORAL | Status: DC
  Administered 2022-12-29 – 2022-12-31 (×3): 200 mg
  Filled 2022-12-29 (×3): qty 2

## 2022-12-29 MED ORDER — LORAZEPAM 2 MG/ML IJ SOLN
0.5000 mg | INTRAMUSCULAR | Status: DC | PRN
  Administered 2022-12-29 – 2023-01-03 (×12): 0.5 mg via INTRAVENOUS
  Filled 2022-12-29 (×13): qty 1

## 2022-12-29 MED ORDER — QUETIAPINE FUMARATE 50 MG PO TABS: 150.0000 mg | ORAL_TABLET | Freq: Two times a day (BID) | ORAL | Status: DC

## 2022-12-29 NOTE — Progress Notes (Signed)
Trauma/Critical Care Follow Up Note  Subjective:    Overnight Issues:   Objective:  Vital signs for last 24 hours: Temp:  [98.2 F (36.8 C)-99.9 F (37.7 C)] 98.4 F (36.9 C) (06/21 0749) Pulse Rate:  [85-92] 87 (06/21 0749) Resp:  [16-20] 19 (06/21 0749) BP: (115-132)/(69-92) 123/76 (06/21 0749) SpO2:  [98 %-100 %] 98 % (06/21 0749) Weight:  [93 kg-93.3 kg] 93.3 kg (06/21 0701)  Hemodynamic parameters for last 24 hours:    Intake/Output from previous day: 06/20 0701 - 06/21 0700 In: 840 [P.O.:780; NG/GT:60] Out: 1900 [Urine:1900]  Intake/Output this shift: Total I/O In: 240 [P.O.:240] Out: -   Vent settings for last 24 hours:    Physical Exam:  Gen: comfortable, no distress Neuro: sleeping on exam Neck: supple CV: RRR Pulm: unlabored breathing on RA Abd: soft, NT    GU: urine clear and yellow, +spontaneous voids Extr: wwp, no edema  Results for orders placed or performed during the hospital encounter of 12/08/22 (from the past 24 hour(s))  Glucose, capillary     Status: Abnormal   Collection Time: 12/28/22 11:52 AM  Result Value Ref Range   Glucose-Capillary 129 (H) 70 - 99 mg/dL  Glucose, capillary     Status: Abnormal   Collection Time: 12/28/22  4:16 PM  Result Value Ref Range   Glucose-Capillary 123 (H) 70 - 99 mg/dL  Glucose, capillary     Status: Abnormal   Collection Time: 12/28/22  7:35 PM  Result Value Ref Range   Glucose-Capillary 104 (H) 70 - 99 mg/dL  Glucose, capillary     Status: None   Collection Time: 12/28/22 11:11 PM  Result Value Ref Range   Glucose-Capillary 94 70 - 99 mg/dL  Glucose, capillary     Status: Abnormal   Collection Time: 12/29/22  3:28 AM  Result Value Ref Range   Glucose-Capillary 107 (H) 70 - 99 mg/dL  Glucose, capillary     Status: Abnormal   Collection Time: 12/29/22  7:50 AM  Result Value Ref Range   Glucose-Capillary 102 (H) 70 - 99 mg/dL    Assessment & Plan: The plan of care was discussed with the  bedside nurse for the day, who is in agreement with this plan and no additional concerns were raised.   Present on Admission:  TBI (traumatic brain injury) (HCC)    LOS: 21 days   Additional comments:I reviewed the patient's new clinical lab test results.   and I reviewed the patients new imaging test results.    S/p fall   TBI with SAH, SDH - NSGY c/s, Dr. Franky Macho, monitor neuro exam, keppra x7d for sz ppx. Repeat CT head 6/1 stable. Repeat CTA head/neck 6/5. EEG negative 6/5. F/C at times now CV - added scheduled propranolol 6/12 with TBI, tachy, HTN - improved.  Agitation/Sedation:  more alert and agitated. Increase seroquel to 100 mg BID 6/18, klonopin 0.5 mg BID PRN. Continue IV ativan PRN. Acute hypoxic respiratory failure:  extubated 6/10, CXR now for fever R temporal bone/mastoid fx, parietal skull fx - NSGY c/s, Dr. Franky Macho, ENT Jearld Fenton) recs audiogram once extubated. No operative intervention required at this point.  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 ASA 81 per Dr. Franky Macho. Fx thru sphenoid bones into carotid canals; vertical mastoid fx Alcohol intoxication - CIWA, TOC c/s. Thiamine, folate, multi.  Mild elevated transaminases  Suspected aspiration PNA, resolved- resp cx 12/11/22  H flu, ceftriaxone x5d (completed 6/7) ID/Fever - Rocephin 6/3 - 6/7 for PNA. Febrile intermittently for 72h. CXR without consolidation or effusion. UA negative. Resp CX MRSA. Cefepime 6/14 >>, vanc 6/15 >> plan for a total of 7 d abx (6/21) C.dif was negative. Foley - d/c 6/11, good UOP FEN - DYS 3/nectar started 6/18, TF nocturnal, replace cortrak (patient removed last PM in a fit of agitation) DVT - SCDs, LMWH Dispo - 4NP, therapies - CIR following  Ritalin trial per Dr. Riley Kill.   Diamantina Monks, MD Trauma & General Surgery Please use [email or web address] to contact on call  provider  12/29/2022  *Care during the described time interval was provided by me. I have reviewed this patient's available data, including medical history, events of note, physical examination and test results as part of my evaluation.

## 2022-12-29 NOTE — Progress Notes (Addendum)
Pt took off safety mitten and pulled out cortrak MD was informed.New orders Tivan 0.5 mg Q4 hours and posey belt

## 2022-12-29 NOTE — Progress Notes (Signed)
Pharmacy Antibiotic Note  Shawn Mills is a 42 y.o. male admitted on 12/08/2022 with MRSA pneumonia.  Pharmacy has been consulted for vancomycin dosing.   Renal function stable, afebrile, WBC downtrending. Vancomycin dose was increased on 6/17 for sub-therapeutic AUC.  Plan: Continue vanc 1500mg  IV Q8H No additional level as therapy to end 6/22 Cefepime 2gm IV Q8H ends today 6/21 Pharmacy will sign off   Height: 5\' 10"  (177.8 cm) Weight: 93.3 kg (205 lb 11 oz) IBW/kg (Calculated) : 73  Temp (24hrs), Avg:98.7 F (37.1 C), Min:98.2 F (36.8 C), Max:99.9 F (37.7 C)  Recent Labs  Lab 12/23/22 0304 12/25/22 0811 12/25/22 0812 12/25/22 1441 12/25/22 1456 12/27/22 0355  WBC 15.5*  --  14.0*  --   --  12.7*  CREATININE  --   --  0.57*  --   --  0.61  VANCOTROUGH  --   --   --  8* 8*  --   VANCOPEAK  --  12*  --   --   --   --      Estimated Creatinine Clearance: 140.8 mL/min (by C-G formula based on SCr of 0.61 mg/dL).    No Known Allergies  CTX 6/3 > 6/7  Cefepime 6/14 >> 6/21 Vanc 6/15 >> 6/22   6/17 VP/VT 12/8, AUC 284 on 1250mg  q12 >> incr 1500mg  q8    6/13 TA - abundant MRSA 6/13 Cdiff - neg  6/3 TA - abundant H.flu, BL neg 6/1 mrsa pcr - neg  Dollye Glasser D. Laney Potash, PharmD, BCPS, BCCCP 12/29/2022, 10:21 AM

## 2022-12-29 NOTE — Procedures (Signed)
Cortrak  Person Inserting Tube:  Brazos Sandoval D, RD Tube Type:  Cortrak - 43 inches Tube Size:  10 Tube Location:  Left nare Secured by: Bridle Technique Used to Measure Tube Placement:  Marking at nare/corner of mouth Cortrak Secured At:  71 cm Procedure Comments:  Cortrak Tube Team Note:  Consult received to place a Cortrak feeding tube.   X-ray is required, abdominal x-ray has been ordered by the Cortrak team. Please confirm tube placement before using the Cortrak tube.   If the tube becomes dislodged please keep the tube and contact the Cortrak team at [email or web address] for replacement.  If after hours and replacement cannot be delayed, place a NG tube and confirm placement with an abdominal x-ray.    Regis Hinton, RD, LDN Clinical Dietitian RD pager # available in AMION  After hours/weekend pager # available in AMION    

## 2022-12-29 NOTE — Progress Notes (Signed)
[facility] PHYSICAL MEDICINE AND REHABILITATION  CONSULT SERVICE NOTE   Pt still restless and distracted. Perhaps a bit less lethargic. Pulled out NG early this morning. RLAS IV appearance.    Plan: Seroquel already increased by trauma team. Would focus more on PM behavior control and sleep with seroquel than day time behavioral control. Sedation and disorientation from the medication during the day may actually increase agitation when staff and others are trying to interact with him. If needed consider increasing propranolol for agitation. BP/HR would tolerate it.  I will increase ritalin to 10mg  at 0700 and 1200 daily to improve attention/arousal.   Rehab Admissions Coordinator to follow up. Consider admission to rehab at beginning of this coming week. Will need insurance auth.    Ranelle Oyster, MD, [facility] Physical Medicine & Rehabilitation Medical Director Rehabilitation Services 12/29/2022

## 2022-12-30 LAB — GLUCOSE, CAPILLARY
Glucose-Capillary: 104 mg/dL — ABNORMAL HIGH (ref 70–99)
Glucose-Capillary: 112 mg/dL — ABNORMAL HIGH (ref 70–99)
Glucose-Capillary: 113 mg/dL — ABNORMAL HIGH (ref 70–99)
Glucose-Capillary: 118 mg/dL — ABNORMAL HIGH (ref 70–99)
Glucose-Capillary: 124 mg/dL — ABNORMAL HIGH (ref 70–99)
Glucose-Capillary: 127 mg/dL — ABNORMAL HIGH (ref 70–99)
Glucose-Capillary: 143 mg/dL — ABNORMAL HIGH (ref 70–99)

## 2022-12-30 MED ORDER — METHYLPHENIDATE HCL 5 MG PO TABS
10.0000 mg | ORAL_TABLET | Freq: Two times a day (BID) | ORAL | Status: DC
  Administered 2022-12-30 – 2022-12-31 (×4): 10 mg
  Filled 2022-12-30 (×3): qty 2

## 2022-12-30 NOTE — Plan of Care (Signed)
  Problem: Education: Goal: Knowledge of General Education information will improve Description: Including pain rating scale, medication(s)/side effects and non-pharmacologic comfort measures Outcome: Progressing   Problem: Health Behavior/Discharge Planning: Goal: Ability to manage health-related needs will improve Outcome: Progressing   Problem: Clinical Measurements: Goal: Ability to maintain clinical measurements within normal limits will improve Outcome: Progressing Goal: Will remain free from infection Outcome: Progressing Goal: Diagnostic test results will improve Outcome: Progressing   Problem: Activity: Goal: Risk for activity intolerance will decrease Outcome: Progressing   Problem: Coping: Goal: Level of anxiety will decrease Outcome: Progressing   Problem: Elimination: Goal: Will not experience complications related to bowel motility Outcome: Progressing Goal: Will not experience complications related to urinary retention Outcome: Progressing   Problem: Pain Managment: Goal: General experience of comfort will improve Outcome: Progressing   Problem: Safety: Goal: Ability to remain free from injury will improve Outcome: Progressing   Problem: Skin Integrity: Goal: Risk for impaired skin integrity will decrease Outcome: Progressing   Problem: Safety: Goal: Non-violent Restraint(s) Outcome: Progressing   Problem: Education: Goal: Understanding of CV disease, CV risk reduction, and recovery process will improve Outcome: Progressing Goal: Individualized Educational Video(s) Outcome: Progressing   Problem: Activity: Goal: Ability to return to baseline activity level will improve Outcome: Progressing   Problem: Cardiovascular: Goal: Ability to achieve and maintain adequate cardiovascular perfusion will improve Outcome: Progressing Goal: Vascular access site(s) Level 0-1 will be maintained Outcome: Progressing   Problem: Health Behavior/Discharge  Planning: Goal: Ability to safely manage health-related needs after discharge will improve Outcome: Progressing

## 2022-12-30 NOTE — Progress Notes (Signed)
Subjective/Chief Complaint: Pt with no acute changes overnight   Objective: Vital signs in last 24 hours: Temp:  [98 F (36.7 C)-100.1 F (37.8 C)] 98.3 F (36.8 C) (06/22 0256) Pulse Rate:  [87-105] 100 (06/22 0256) Resp:  [19-22] 22 (06/22 0256) BP: (117-131)/(70-85) 123/85 (06/22 0800) SpO2:  [98 %-100 %] 100 % (06/22 0256) Weight:  [91.1 kg] 91.1 kg (06/22 0500) Last BM Date : 12/29/22  Intake/Output from previous day: 06/21 0701 - 06/22 0700 In: 600 [P.O.:600] Out: 1500 [Urine:1500] Intake/Output this shift: No intake/output data recorded.  Physical Exam:  Gen: comfortable, no distress Neuro: sleeping on exam Neck: supple CV: RRR Pulm: unlabored breathing on RA Abd: soft, NT    GU: urine clear and yellow, +spontaneous voids Extr: wwp, no edema  Studies/Results: DG Abd Portable 1V  Result Date: 12/29/2022 CLINICAL DATA:  Feeding tube placement EXAM: PORTABLE ABDOMEN - 1 VIEW COMPARISON:  KUB 12/20/2022 FINDINGS: The enteric catheter tip projects over the distal stomach. There is a nonobstructive bowel gas pattern in the imaged upper abdomen. There is no definite free intraperitoneal air. The cardiomediastinal silhouette is normal. There is no focal consolidation or pulmonary edema. There is no pleural effusion or pneumothorax There is no acute osseous abnormality. IMPRESSION: Enteric catheter tip in the distal stomach. Electronically Signed   By: Lesia Hausen M.D.   On: 12/29/2022 11:37    Anti-infectives: Anti-infectives (From admission, onward)    Start     Dose/Rate Route Frequency Ordered Stop   12/26/22 2200  vancomycin (VANCOREADY) IVPB 1500 mg/300 mL        1,500 mg 150 mL/hr over 120 Minutes Intravenous Every 8 hours 12/25/22 1653 12/31/22 2159   12/24/22 0200  vancomycin (VANCOREADY) IVPB 1250 mg/250 mL  Status:  Discontinued        1,250 mg 166.7 mL/hr over 90 Minutes Intravenous Every 12 hours 12/23/22 1342 12/25/22 1653   12/23/22 1430   vancomycin (VANCOREADY) IVPB 2000 mg/400 mL        2,000 mg 200 mL/hr over 120 Minutes Intravenous  Once 12/23/22 1342 12/23/22 1641   12/22/22 1100  ceFEPIme (MAXIPIME) 2 g in sodium chloride 0.9 % 100 mL IVPB        2 g 200 mL/hr over 30 Minutes Intravenous Every 8 hours 12/22/22 0949 12/29/22 1927   12/11/22 1115  cefTRIAXone (ROCEPHIN) 2 g in sodium chloride 0.9 % 100 mL IVPB        2 g 200 mL/hr over 30 Minutes Intravenous Every 24 hours 12/11/22 1020 12/15/22 1117       Assessment/Plan:  S/p fall   TBI with SAH, SDH - NSGY c/s, Dr. Franky Macho, monitor neuro exam, keppra x7d for sz ppx. Repeat CT head 6/1 stable. Repeat CTA head/neck 6/5. EEG negative 6/5. F/C at times now CV - added scheduled propranolol 6/12 with TBI, tachy, HTN - improved.  Agitation/Sedation:  more alert and agitated. Increase seroquel to 100 mg BID 6/18, klonopin 0.5 mg BID PRN. Continue IV ativan PRN. Acute hypoxic respiratory failure:  extubated 6/10, CXR now for fever R temporal bone/mastoid fx, parietal skull fx - NSGY c/s, Dr. Franky Macho, ENT Jearld Fenton) recs audiogram once extubated. No operative intervention required at this point.  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 ASA 81 per Dr. Franky Macho. Fx thru sphenoid bones into carotid canals; vertical mastoid fx Alcohol intoxication - CIWA, TOC c/s. Thiamine, folate, multi.  Mild elevated transaminases  Suspected aspiration PNA, resolved- resp cx 12/11/22  H flu, ceftriaxone x5d (completed 6/7) ID/Fever - Rocephin 6/3 - 6/7 for PNA. Febrile intermittently for 72h. CXR without consolidation or effusion. UA negative. Resp CX MRSA. Cefepime 6/14 >>, vanc 6/15 >> plan for a total of 7 d abx (6/21) C.dif was negative. Foley - d/c 6/11, good UOP FEN - DYS 3/nectar started 6/18, TF nocturnal, replace cortrak (patient removed last PM in a fit of agitation) DVT - SCDs,  LMWH Dispo - 4NP, therapies - CIR following  Ritalin trial per Dr. Riley Kill.  LOS: 22 days    Axel Filler 12/30/2022

## 2022-12-31 LAB — GLUCOSE, CAPILLARY
Glucose-Capillary: 100 mg/dL — ABNORMAL HIGH (ref 70–99)
Glucose-Capillary: 103 mg/dL — ABNORMAL HIGH (ref 70–99)
Glucose-Capillary: 109 mg/dL — ABNORMAL HIGH (ref 70–99)
Glucose-Capillary: 122 mg/dL — ABNORMAL HIGH (ref 70–99)
Glucose-Capillary: 129 mg/dL — ABNORMAL HIGH (ref 70–99)
Glucose-Capillary: 88 mg/dL (ref 70–99)

## 2022-12-31 NOTE — Progress Notes (Signed)
Assessment & Plan: HD#24 - S/p fall   TBI with SAH, SDH - NSGY c/s, Dr. Franky Macho, monitor neuro exam, keppra x7d for sz ppx. Repeat CT head 6/1 stable. Repeat CTA head/neck 6/5. EEG negative 6/5. F/C at times now CV - added scheduled propranolol 6/12 with TBI, tachy, HTN - improved.  Agitation/Sedation:  more alert and agitated. Increase seroquel to 100 mg BID 6/18, klonopin 0.5 mg BID PRN. Continue IV ativan PRN. Acute hypoxic respiratory failure:  extubated 6/10 R temporal bone/mastoid fx, parietal skull fx - NSGY c/s, Dr. Franky Macho, ENT Jearld Fenton) recs audiogram once extubated. No operative intervention required. 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 ASA 81 per Dr. Franky Macho. Fx thru sphenoid bones into carotid canals; vertical mastoid fx Alcohol intoxication - CIWA, TOC c/s. Thiamine, folate, multi.  Mild elevated transaminases  Suspected aspiration PNA, resolved- resp cx 12/11/22  H flu, ceftriaxone x5d (completed 6/7) ID/Fever - Rocephin 6/3 - 6/7 for PNA. Febrile intermittently for 72h. CXR without consolidation or effusion. UA negative. Resp CX MRSA. Cefepime 6/14 >>, vanc 6/15 >> plan for a total of 7 d abx (6/21) C.dif was negative. Foley - d/c 6/11, good UOP FEN - DYS 3/nectar started 6/18, TF nocturnal, replace cortrak DVT - SCDs, LMWH Dispo - 4NP, therapies - CIR following. Ritalin trial per Dr. Riley Kill.         Darnell Level, MD [facility] A [facility] practice Office: [phone]        Chief Complaint: Shawn Mills, TBI  Subjective: Patient awake, responsive, pleasant.  No complaints.  Objective: Vital signs in last 24 hours: Temp:  [98.5 F (36.9 C)-98.8 F (37.1 C)] 98.6 F (37 C) (06/23 1127) Pulse Rate:  [90-100] 90 (06/23 0820) Resp:  [20-22] 20 (06/23 0305) BP: (91-129)/(68-88) 129/68 (06/23 1127) SpO2:  [94 %-100 %] 99 % (06/23 0820) Weight:  [92 kg]  92 kg (06/23 0321) Last BM Date : 12/29/22  Intake/Output from previous day: 06/22 0701 - 06/23 0700 In: 1868.8 [P.O.:960; NG/GT:908.8] Out: 2450 [Urine:2450] Intake/Output this shift: No intake/output data recorded.  Physical Exam: HEENT - sclerae clear, mucous membranes moist Abdomen - soft without distension, non-tender Ext - no edema, non-tender  Lab Results:  No results for input(s): "WBC", "HGB", "HCT", "PLT" in the last 72 hours. BMET No results for input(s): "NA", "K", "CL", "CO2", "GLUCOSE", "BUN", "CREATININE", "CALCIUM" in the last 72 hours. PT/INR No results for input(s): "LABPROT", "INR" in the last 72 hours. Comprehensive Metabolic Panel:    Component Value Date/Time   NA 133 (L) 12/27/2022 0355   NA 133 (L) 12/25/2022 0812   K 3.9 12/27/2022 0355   K 4.1 12/25/2022 0812   CL 100 12/27/2022 0355   CL 102 12/25/2022 0812   CO2 25 12/27/2022 0355   CO2 19 (L) 12/25/2022 0812   BUN 21 (H) 12/27/2022 0355   BUN 21 (H) 12/25/2022 0812   CREATININE 0.61 12/27/2022 0355   CREATININE 0.57 (L) 12/25/2022 0812   GLUCOSE 84 12/27/2022 0355   GLUCOSE 115 (H) 12/25/2022 0812   CALCIUM 8.9 12/27/2022 0355   CALCIUM 8.6 (L) 12/25/2022 0812   AST 34 12/25/2022 0812   AST 136 (H) 12/17/2022 0227   ALT 74 (H) 12/25/2022 0812   ALT 227 (H) 12/17/2022 0227   ALKPHOS 107 12/25/2022 0812   ALKPHOS 134 (H) 12/17/2022 0227   BILITOT 0.4 12/25/2022 0812   BILITOT 0.4  12/17/2022 0227   PROT 7.0 12/25/2022 0812   PROT 6.3 (L) 12/17/2022 0227   ALBUMIN 2.6 (L) 12/25/2022 0812   ALBUMIN 2.3 (L) 12/17/2022 0227    Studies/Results: No results found.    Darnell Level 12/31/2022  Patient ID: Kerby Less, male   DOB: [birth year], 42 y.o.   MRN: [id number]

## 2023-01-01 LAB — GLUCOSE, CAPILLARY
Glucose-Capillary: 86 mg/dL (ref 70–99)
Glucose-Capillary: 109 mg/dL — ABNORMAL HIGH (ref 70–99)
Glucose-Capillary: 95 mg/dL (ref 70–99)
Glucose-Capillary: 91 mg/dL (ref 70–99)
Glucose-Capillary: 86 mg/dL (ref 70–99)

## 2023-01-01 MED ORDER — OXYCODONE HCL 5 MG PO TABS
5.0000 mg | ORAL_TABLET | ORAL | Status: DC | PRN
  Administered 2023-01-01 – 2023-01-03 (×5): 5 mg via ORAL
  Filled 2023-01-01 (×5): qty 1

## 2023-01-01 MED ORDER — QUETIAPINE FUMARATE 200 MG PO TABS
200.0000 mg | ORAL_TABLET | Freq: Every day | ORAL | Status: DC
  Administered 2023-01-01 – 2023-01-03 (×3): 200 mg via ORAL
  Filled 2023-01-01: qty 1
  Filled 2023-01-01 (×2): qty 2

## 2023-01-01 MED ORDER — BANATROL TF EN LIQD: 60.0000 mL | Freq: Three times a day (TID) | ENTERAL | Status: DC

## 2023-01-01 MED ORDER — ACETAMINOPHEN 500 MG PO TABS
1000.0000 mg | ORAL_TABLET | Freq: Four times a day (QID) | ORAL | Status: DC
  Administered 2023-01-01 – 2023-01-04 (×11): 1000 mg via ORAL
  Filled 2023-01-01 (×12): qty 2

## 2023-01-01 MED ORDER — ASPIRIN 81 MG PO CHEW
81.0000 mg | CHEWABLE_TABLET | Freq: Every day | ORAL | Status: DC
  Administered 2023-01-02 – 2023-01-04 (×3): 81 mg via ORAL
  Filled 2023-01-01 (×4): qty 1

## 2023-01-01 MED ORDER — METHYLPHENIDATE HCL 5 MG PO TABS
10.0000 mg | ORAL_TABLET | Freq: Two times a day (BID) | ORAL | Status: DC
  Administered 2023-01-01 – 2023-01-04 (×7): 10 mg via ORAL
  Filled 2023-01-01 (×7): qty 2

## 2023-01-01 MED ORDER — PROPRANOLOL HCL 20 MG/5ML PO SOLN
20.0000 mg | Freq: Three times a day (TID) | ORAL | Status: DC
  Administered 2023-01-01 – 2023-01-04 (×8): 20 mg via ORAL
  Filled 2023-01-01 (×11): qty 5

## 2023-01-01 MED ORDER — ADULT MULTIVITAMIN W/MINERALS CH
1.0000 | ORAL_TABLET | Freq: Every day | ORAL | Status: DC
  Administered 2023-01-02 – 2023-01-04 (×3): 1 via ORAL
  Filled 2023-01-01 (×3): qty 1

## 2023-01-01 MED ORDER — DIPHENHYDRAMINE-ZINC ACETATE 2-0.1 % EX CREA
TOPICAL_CREAM | Freq: Two times a day (BID) | CUTANEOUS | Status: DC | PRN
  Filled 2023-01-01: qty 28

## 2023-01-01 MED ORDER — FOLIC ACID 1 MG PO TABS
1.0000 mg | ORAL_TABLET | Freq: Every day | ORAL | Status: DC
  Administered 2023-01-02 – 2023-01-04 (×3): 1 mg via ORAL
  Filled 2023-01-01 (×3): qty 1

## 2023-01-01 MED ORDER — CLONAZEPAM 0.125 MG PO TBDP
0.2500 mg | ORAL_TABLET | Freq: Two times a day (BID) | ORAL | Status: DC
  Administered 2023-01-01 – 2023-01-04 (×6): 0.25 mg via ORAL
  Filled 2023-01-01: qty 2
  Filled 2023-01-01 (×3): qty 1
  Filled 2023-01-01: qty 2
  Filled 2023-01-01: qty 1

## 2023-01-01 MED ORDER — THIAMINE MONONITRATE 100 MG PO TABS
100.0000 mg | ORAL_TABLET | Freq: Every day | ORAL | Status: DC
  Administered 2023-01-02 – 2023-01-04 (×3): 100 mg via ORAL
  Filled 2023-01-01 (×3): qty 1

## 2023-01-01 NOTE — Progress Notes (Signed)
Inpatient Rehab Admissions Coordinator:   CIR following. Pt is alert today, answering questions appropriately but mildly confused. Girlfriend present and affirms that she and Pt.'s family can manage 24/7 support at d/c. Pt. Did well with PT today. Once updated OT note is in, I will send case to insurance and pursue for admit.   Megan Salon, MS, CCC-SLP Rehab Admissions Coordinator  [phone] (celll) [phone] (office)

## 2023-01-01 NOTE — Plan of Care (Signed)
  Problem: Education: Goal: Knowledge of General Education information will improve Description: Including pain rating scale, medication(s)/side effects and non-pharmacologic comfort measures Outcome: Progressing   Problem: Health Behavior/Discharge Planning: Goal: Ability to manage health-related needs will improve Outcome: Progressing   Problem: Clinical Measurements: Goal: Ability to maintain clinical measurements within normal limits will improve Outcome: Progressing Goal: Will remain free from infection Outcome: Progressing Goal: Diagnostic test results will improve Outcome: Progressing   Problem: Activity: Goal: Risk for activity intolerance will decrease Outcome: Progressing   Problem: Coping: Goal: Level of anxiety will decrease Outcome: Progressing   Problem: Elimination: Goal: Will not experience complications related to bowel motility Outcome: Progressing Goal: Will not experience complications related to urinary retention Outcome: Progressing   Problem: Pain Managment: Goal: General experience of comfort will improve Outcome: Progressing   Problem: Safety: Goal: Ability to remain free from injury will improve Outcome: Progressing   Problem: Skin Integrity: Goal: Risk for impaired skin integrity will decrease Outcome: Progressing   Problem: Safety: Goal: Non-violent Restraint(s) Outcome: Progressing   Problem: Education: Goal: Understanding of CV disease, CV risk reduction, and recovery process will improve Outcome: Progressing Goal: Individualized Educational Video(s) Outcome: Progressing   Problem: Activity: Goal: Ability to return to baseline activity level will improve Outcome: Progressing   Problem: Cardiovascular: Goal: Ability to achieve and maintain adequate cardiovascular perfusion will improve Outcome: Progressing Goal: Vascular access site(s) Level 0-1 will be maintained Outcome: Progressing   Problem: Health Behavior/Discharge  Planning: Goal: Ability to safely manage health-related needs after discharge will improve Outcome: Progressing   

## 2023-01-01 NOTE — Progress Notes (Signed)
RN went into patients room and found left hand mitten off and cortrak out even with restraints on.

## 2023-01-01 NOTE — Progress Notes (Signed)
Occupational Therapy Treatment Patient Details Name: Shawn Mills MRN: [id number] DOB: Aug 08, 1980 Today's Date: 01/01/2023   History of present illness 42 yo male s/p fall TBI with SAH, SDH, R temporal bone, vertical mastoid fx, parietal skull fx intubated 5/31- extubated 6/10 possible ICA dissection, fx sphenoid bones into carotid canals CIWA ETOH + PMH: none   OT comments  Pt demonstrates behavior of [place] recovery level V. Pt is unable to recall new information but correctly reports fiance by her nickname. Pt requires increased time but able to initiate sink level grooming task this session. Pt noted to have skin irritation and internal distraction of scratching at skin. Trauma made aware to attempt to sooth skin and decrease restless scratching/ break of the skin with finger nails. Pt completed basic transfers min (A)+2  with RW. Recommendation for Patient will benefit from intensive inpatient follow up therapy, >3 hours/day    Recommendations for follow up therapy are one component of a multi-disciplinary discharge planning process, led by the attending physician.  Recommendations may be updated based on patient status, additional functional criteria and insurance authorization.    Assistance Recommended at Discharge Frequent or constant Supervision/Assistance  Patient can return home with the following  Two people to help with bathing/dressing/bathroom;Two people to help with walking and/or transfers;Assistance with feeding;Assistance with cooking/housework;Assist for transportation;Direct supervision/assist for financial management;Direct supervision/assist for medications management;Help with stairs or ramp for entrance   Equipment Recommendations  Wheelchair (measurements OT);Wheelchair cushion (measurements OT);Hospital bed;Other (comment)    Recommendations for Other Services Rehab consult    Precautions / Restrictions Precautions Precautions: Fall Precaution Comments:  Mittens, bil wrist restraints, waist restraint, ankle restraints, condom  catheter Restrictions Weight Bearing Restrictions: No       Mobility Bed Mobility Overal bed mobility: Needs Assistance Bed Mobility: Supine to Sit, Sit to Supine         Sit to sidelying: Min guard General bed mobility comments: assist to gain balance as coming up to sitting; guarding assist for safety as returning to bed    Transfers Overall transfer level: Needs assistance Equipment used: Rolling walker (2 wheels), None Transfers: Sit to/from Stand, Bed to chair/wheelchair/BSC Sit to Stand: Min assist, +2 safety/equipment     Step pivot transfers: Min assist, +2 safety/equipment     General transfer comment: instructed to "come sit in the chair" and pt initiated standing and pivoted to chair with min assist (trying to sit down before close enough to chair); chair to bed with min assist for balance as quick descent to sitting with posterior LOB. pt given a brown paper towel as a visual cue to move from bathroom doorway to entrance of patients room. pt initiated the task with visual cue.     Balance Overall balance assessment: Needs assistance Sitting-balance support: Feet supported Sitting balance-Leahy Scale: Poor Sitting balance - Comments: intermittent use of UEs to maintain balance. imbalance posteriorly, especially when trying to scoot forward and when crossing his legs   Standing balance support: Bilateral upper extremity supported, Reliant on assistive device for balance, During functional activity Standing balance-Leahy Scale: Poor Standing balance comment: UE support                           ADL either performed or assessed with clinical judgement   ADL Overall ADL's : Needs assistance/impaired     Grooming: Wash/dry hands;Wash/dry face;Oral care;Minimal assistance;Sitting  Lower Body Dressing: Maximal assistance   Toilet Transfer: Minimal assistance            Functional mobility during ADLs: Minimal assistance;Rolling walker (2 wheels) General ADL Comments: pt progressed with mobility this session and completed sink level grooming task.    Extremity/Trunk Assessment Upper Extremity Assessment Upper Extremity Assessment: Generalized weakness            Vision   Additional Comments: pt scanning his skin using the mirror to inspect it.   Perception     Praxis      Cognition Arousal/Alertness: Awake/alert (after awakened) Behavior During Therapy: Restless Overall Cognitive Status: Impaired/Different from baseline Area of Impairment: Orientation, Attention, Memory, Following commands, Safety/judgement, Awareness, Problem solving, Rancho level               Rancho Levels of Cognitive Functioning [place]igos Scales of Cognitive Functioning: Confused, Inappropriate Non-Agitated Orientation Level: Disoriented to, Place, Time, Situation Current Attention Level: Focused, Sustained Memory: Decreased short-term memory, Decreased recall of precautions Following Commands: Follows one step commands inconsistently (and with multimodal cues) Safety/Judgement: Decreased awareness of safety, Decreased awareness of deficits Awareness: Intellectual Problem Solving: Difficulty sequencing, Requires verbal cues, Requires tactile cues, Decreased initiation, Slow processing General Comments: Able to attend to brushing his teeth, attended to task to walk to the sign and touch it and was able to demonstrate. pt initially using the tooth bruth to brush his skin due to irritation of the skin. OT instructing the patient to state "i am at [facility]" multiple times during the session and then asked to immediately recall the information without return. pt reports "i believe you" when told you are at the hospital. [facility] Scales of Cognitive Functioning: Confused, Inappropriate Non-Agitated      Exercises      Shoulder  Instructions       General Comments VSS on RA    Pertinent Vitals/ Pain       Pain Assessment Pain Assessment: Faces Faces Pain Scale: No hurt  Home Living                                          Prior Functioning/Environment              Frequency  Min 2X/week        Progress Toward Goals  OT Goals(current goals can now be found in the care plan section)  Progress towards OT goals: Progressing toward goals  Acute Rehab OT Goals Patient Stated Goal: fiance reports he is doing so much better and hope it gets better if he can get up more OT Goal Formulation: Patient unable to participate in goal setting Time For Goal Achievement: 01/15/23 Potential to Achieve Goals: Good ADL Goals Pt Will Perform Grooming: with min guard assist;sitting Additional ADL Goal #1: Pt will locate 2 adl items on sink to complete grooming task. Additional ADL Goal #2: pt will sit eob supervision level Additional ADL Goal #3: pt will complete 3 step command 50% of attempts  Plan Discharge plan remains appropriate    Co-evaluation    PT/OT/SLP Co-Evaluation/Treatment: Yes Reason for Co-Treatment: Complexity of the patient's impairments (multi-system involvement);Necessary to address cognition/behavior during functional activity;For patient/therapist safety;To address functional/ADL transfers PT goals addressed during session:  (cognition) OT goals addressed during session: ADL's and self-care;Proper use of Adaptive equipment and DME;Strengthening/ROM  AM-PAC OT "6 Clicks" Daily Activity     Outcome Measure   Help from another person eating meals?: A Little Help from another person taking care of personal grooming?: A Little Help from another person toileting, which includes using toliet, bedpan, or urinal?: A Lot Help from another person bathing (including washing, rinsing, drying)?: A Lot Help from another person to put on and taking off regular upper body  clothing?: A Little Help from another person to put on and taking off regular lower body clothing?: A Lot 6 Click Score: 15    End of Session Equipment Utilized During Treatment: Rolling walker (2 wheels);Gait belt  OT Visit Diagnosis: Unsteadiness on feet (R26.81);Muscle weakness (generalized) (M62.81)   Activity Tolerance Patient tolerated treatment well   Patient Left in bed;with call bell/phone within reach;with bed alarm set;with restraints reapplied   Nurse Communication Mobility status;Precautions        Time: 8119-1478 OT Time Calculation (min): 41 min  Charges: OT General Charges $OT Visit: 1 Visit OT Treatments $Self Care/Home Management : 8-22 mins   Brynn, OTR/L  Acute Rehabilitation Services Office: [phone] .   Mateo Flow 01/01/2023, 2:07 PM

## 2023-01-01 NOTE — Progress Notes (Signed)
Physical Therapy Treatment Patient Details Name: Shawn Mills MRN: [id number] DOB: 08-19-1980 Today's Date: 01/01/2023   History of Present Illness 42 yo male s/p fall TBI with SAH, SDH, R temporal bone, vertical mastoid fx, parietal skull fx intubated 5/31- extubated 6/10 possible ICA dissection, fx sphenoid bones into carotid canals CIWA ETOH + PMH: none    PT Comments    Patient sleeping on arrival, yet easily aroused and once sitting EOB fully alert. Patient following simple commands "come sit in chair," "walk to that sign and touch it." Initial inappropriate use of toothbrush as he used it to scrub his forehead. Once cued to brush his teeth, pt followed through on task. Able to walk 10 ft with RW with min assist with recliner to close follow (+2). Returned to bed due to multiple restraints currently in use including posey and all 4 extremities.     Recommendations for follow up therapy are one component of a multi-disciplinary discharge planning process, led by the attending physician.  Recommendations may be updated based on patient status, additional functional criteria and insurance authorization.  Follow Up Recommendations       Assistance Recommended at Discharge Frequent or constant Supervision/Assistance  Patient can return home with the following Two people to help with walking and/or transfers;Assistance with cooking/housework;Assist for transportation;Help with stairs or ramp for entrance;Two people to help with bathing/dressing/bathroom   Equipment Recommendations  Other (comment) (TBA)    Recommendations for Other Services       Precautions / Restrictions Precautions Precautions: Fall Precaution Comments: Mittens, bil wrist restraints, waist restraint, ankle restraints, condom  catheter Restrictions Weight Bearing Restrictions: No     Mobility  Bed Mobility Overal bed mobility: Needs Assistance Bed Mobility: Supine to Sit, Sit to Supine         Sit to  sidelying: Min guard General bed mobility comments: assist to gain balance as coming up to sitting; guarding assist for safety as returning to bed    Transfers Overall transfer level: Needs assistance Equipment used: Rolling walker (2 wheels), None Transfers: Sit to/from Stand, Bed to chair/wheelchair/BSC Sit to Stand: Min assist, +2 safety/equipment   Step pivot transfers: Min assist, +2 safety/equipment       General transfer comment: instructed to "come sit in the chair" and pt initiated standing and pivoted to chair with min assist (trying to sit down before close enough to chair); chair to bed with min assist for balance as quick descent to sitting with posterior LOB    Ambulation/Gait Ambulation/Gait assistance: Min assist, +2 safety/equipment Gait Distance (Feet): 10 Feet Assistive device: Rolling walker (2 wheels) Gait Pattern/deviations: Decreased stride length, Wide base of support, Step-through pattern       General Gait Details: close follow with recliner; instructed to walk to the brown sign and touch it and pt able to complete task without further cues; assist for managing RW and balance   Stairs             Wheelchair Mobility    Modified Rankin (Stroke Patients Only)       Balance Overall balance assessment: Needs assistance Sitting-balance support: Feet supported Sitting balance-Leahy Scale: Poor Sitting balance - Comments: intermittent use of UEs to maintain balance. imbalance posteriorly, especially when trying to scoot forward and when crossing his legs   Standing balance support: Bilateral upper extremity supported, Reliant on assistive device for balance, During functional activity Standing balance-Leahy Scale: Poor Standing balance comment: UE support  Cognition Arousal/Alertness: Awake/alert (after awakened) Behavior During Therapy: Restless Overall Cognitive Status: Impaired/Different from  baseline Area of Impairment: Orientation, Attention, Memory, Following commands, Safety/judgement, Awareness, Problem solving, Rancho level               Rancho Levels of Cognitive Functioning [place] Los Amigos Scales of Cognitive Functioning: Confused, Inappropriate Non-Agitated Orientation Level: Disoriented to, Place, Time, Situation Current Attention Level: Focused, Sustained Memory: Decreased short-term memory, Decreased recall of precautions Following Commands: Follows one step commands inconsistently (and with multimodal cues) Safety/Judgement: Decreased awareness of safety, Decreased awareness of deficits Awareness: Intellectual Problem Solving: Difficulty sequencing, Requires verbal cues, Requires tactile cues, Decreased initiation, Slow processing General Comments: Able to attend to brushing his teeth, attended to task to walk to the sign and touch it and was able to demonstrate   [facility] Scales of Cognitive Functioning: Confused, Inappropriate Non-Agitated    Exercises      General Comments General comments (skin integrity, edema, etc.): VSS per telemetry      Pertinent Vitals/Pain Pain Assessment Pain Assessment: Faces Faces Pain Scale: No hurt    Home Living                          Prior Function            PT Goals (current goals can now be found in the care plan section) Acute Rehab PT Goals Patient Stated Goal: to return home PT Goal Formulation: With family Time For Goal Achievement: 01/02/23 Potential to Achieve Goals: Fair Progress towards PT goals: Progressing toward goals    Frequency    Min 4X/week      PT Plan Current plan remains appropriate    Co-evaluation PT/OT/SLP Co-Evaluation/Treatment: Yes Reason for Co-Treatment: Complexity of the patient's impairments (multi-system involvement);Necessary to address cognition/behavior during functional activity;For patient/therapist safety;To address functional/ADL  transfers PT goals addressed during session: Mobility/safety with mobility;Balance (cognition)        AM-PAC PT "6 Clicks" Mobility   Outcome Measure  Help needed turning from your back to your side while in a flat bed without using bedrails?: A Little Help needed moving from lying on your back to sitting on the side of a flat bed without using bedrails?: A Little Help needed moving to and from a bed to a chair (including a wheelchair)?: A Little Help needed standing up from a chair using your arms (e.g., wheelchair or bedside chair)?: A Little Help needed to walk in hospital room?: Total Help needed climbing 3-5 steps with a railing? : Total 6 Click Score: 14    End of Session Equipment Utilized During Treatment: Gait belt Activity Tolerance: Patient tolerated treatment well Patient left: in bed;with call bell/phone within reach;with restraints reapplied;with bed alarm set;with family/visitor present Nurse Communication: Mobility status;Other (comment) (better cognition; fiance arrived--?trial out of some of his restraints?) PT Visit Diagnosis: Other abnormalities of gait and mobility (R26.89);Muscle weakness (generalized) (M62.81);Other symptoms and signs involving the nervous system (R29.898)     Time: 1191-4782 PT Time Calculation (min) (ACUTE ONLY): 46 min  Charges:  $Gait Training: 8-22 mins $Therapeutic Activity: 8-22 mins                      Jerolyn Center, PT Acute Rehabilitation Services  Office [phone]    Zena Amos 01/01/2023, 11:03 AM

## 2023-01-01 NOTE — Progress Notes (Signed)
Progress Note     Subjective: Pt removed Cortrak, still confused but more appropriate overall. Facial itching   Objective: Vital signs in last 24 hours: Temp:  [98.1 F (36.7 C)-98.8 F (37.1 C)] 98.8 F (37.1 C) (06/24 0745) Pulse Rate:  [93-106] 96 (06/24 0750) Resp:  [11-29] 24 (06/24 0750) BP: (119-135)/(68-105) 129/82 (06/24 0745) SpO2:  [92 %-100 %] 100 % (06/24 0750) Last BM Date : 12/31/22  Intake/Output from previous day: 06/23 0701 - 06/24 0700 In: 100 [P.O.:100] Out: 700 [Urine:700] Intake/Output this shift: No intake/output data recorded.  PE: Gen: comfortable, no distress Neuro: sleeping on exam Neck: supple CV: RRR Pulm: unlabored breathing on RA Abd: soft, NT    GU: urine clear and yellow, +spontaneous voids Extr: wwp, no edema   Lab Results:  No results for input(s): "WBC", "HGB", "HCT", "PLT" in the last 72 hours. BMET No results for input(s): "NA", "K", "CL", "CO2", "GLUCOSE", "BUN", "CREATININE", "CALCIUM" in the last 72 hours. PT/INR No results for input(s): "LABPROT", "INR" in the last 72 hours. CMP     Component Value Date/Time   NA 133 (L) 12/27/2022 0355   K 3.9 12/27/2022 0355   CL 100 12/27/2022 0355   CO2 25 12/27/2022 0355   GLUCOSE 84 12/27/2022 0355   BUN 21 (H) 12/27/2022 0355   CREATININE 0.61 12/27/2022 0355   CALCIUM 8.9 12/27/2022 0355   PROT 7.0 12/25/2022 0812   ALBUMIN 2.6 (L) 12/25/2022 0812   AST 34 12/25/2022 0812   ALT 74 (H) 12/25/2022 0812   ALKPHOS 107 12/25/2022 0812   BILITOT 0.4 12/25/2022 0812   GFRNONAA >60 12/27/2022 0355   Lipase  No results found for: "LIPASE"     Studies/Results: No results found.  Anti-infectives: Anti-infectives (From admission, onward)    Start     Dose/Rate Route Frequency Ordered Stop   12/26/22 2200  vancomycin (VANCOREADY) IVPB 1500 mg/300 mL        1,500 mg 150 mL/hr over 120 Minutes Intravenous Every 8 hours 12/25/22 1653 12/31/22 1733   12/24/22 0200   vancomycin (VANCOREADY) IVPB 1250 mg/250 mL  Status:  Discontinued        1,250 mg 166.7 mL/hr over 90 Minutes Intravenous Every 12 hours 12/23/22 1342 12/25/22 1653   12/23/22 1430  vancomycin (VANCOREADY) IVPB 2000 mg/400 mL        2,000 mg 200 mL/hr over 120 Minutes Intravenous  Once 12/23/22 1342 12/23/22 1641   12/22/22 1100  ceFEPIme (MAXIPIME) 2 g in sodium chloride 0.9 % 100 mL IVPB        2 g 200 mL/hr over 30 Minutes Intravenous Every 8 hours 12/22/22 0949 12/29/22 1927   12/11/22 1115  cefTRIAXone (ROCEPHIN) 2 g in sodium chloride 0.9 % 100 mL IVPB        2 g 200 mL/hr over 30 Minutes Intravenous Every 24 hours 12/11/22 1020 12/15/22 1117        Assessment/Plan  fall   TBI with SAH, SDH - NSGY c/s, Dr. Franky Macho, monitor neuro exam, keppra x7d for sz ppx. Repeat CT head 6/1 stable. Repeat CTA head/neck 6/5. EEG negative 6/5. F/C at times now CV - added scheduled propranolol 6/12 with TBI, tachy, HTN - improved.  Agitation/Sedation:  more alert and agitated. 200 mg seroquel at bedtime and 100 mg seroquel Q AM. Schedule 0.25 mg klonopin BID, prn ativan Acute hypoxic respiratory failure:  extubated 6/10 R temporal bone/mastoid fx, parietal skull fx - NSGY c/s,  Dr. Franky Macho, ENT Jearld Fenton) recs audiogram once extubated. No operative intervention required. 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 ASA 81 per Dr. Franky Macho. Fx thru sphenoid bones into carotid canals; vertical mastoid fx Alcohol intoxication - CIWA, TOC c/s. Thiamine, folate, multi.  Mild elevated transaminases  Suspected aspiration PNA, resolved- resp cx 12/11/22  H flu, ceftriaxone x5d (completed 6/7) Facial itching - topical benadryl cream ID/Fever - Rocephin 6/3 - 6/7 for PNA. Vanc completed for MRSA PNA  Foley - d/c 6/11, good UOP FEN - DYS 3/nectar started 6/18, Cortrak out - monitor out for the next 48 hrs  DVT -  SCDs, LMWH Dispo - 4NP, therapies - CIR following. Ritalin trial per Dr. Riley Kill.   LOS: 24 days   I reviewed nursing notes, last 24 h vitals and pain scores, and last 48 h intake and output.   Juliet Rude, [facility] 01/01/2023, 11:16 AM Please see Amion for pager number during day hours 7:00am-4:30pm

## 2023-01-02 LAB — GLUCOSE, CAPILLARY
Glucose-Capillary: 112 mg/dL — ABNORMAL HIGH (ref 70–99)
Glucose-Capillary: 92 mg/dL (ref 70–99)
Glucose-Capillary: 120 mg/dL — ABNORMAL HIGH (ref 70–99)

## 2023-01-02 NOTE — Progress Notes (Signed)
Physical Therapy Treatment Patient Details Name: Shawn Mills MRN: [id number] DOB: 1980/07/24 Today's Date: 01/02/2023   History of Present Illness 42 yo male s/p fall TBI with SAH, SDH, R temporal bone, vertical mastoid fx, parietal skull fx intubated 5/31- extubated 6/10 possible ICA dissection, fx sphenoid bones into carotid canals CIWA ETOH + PMH: none    PT Comments    Patient up in chair on arrival (with waist chair alarm in place). Introduced himself appropriately. No idea where he is or why he is in the hospital. Unable to recall this information after <2 minutes and great difficulty repeating it back to PT immediately after educated. Ambulation and balance much improved (+1 min assist), although legs feel shakey/weak with incr distance.    Recommendations for follow up therapy are one component of a multi-disciplinary discharge planning process, led by the attending physician.  Recommendations may be updated based on patient status, additional functional criteria and insurance authorization.  Follow Up Recommendations       Assistance Recommended at Discharge Frequent or constant Supervision/Assistance  Patient can return home with the following Two people to help with walking and/or transfers;Assistance with cooking/housework;Assist for transportation;Help with stairs or ramp for entrance;Two people to help with bathing/dressing/bathroom   Equipment Recommendations  Other (comment) (TBA)    Recommendations for Other Services Rehab consult     Precautions / Restrictions Precautions Precautions: Fall Precaution Comments: waist chair alarm Restrictions Weight Bearing Restrictions: No     Mobility  Bed Mobility               General bed mobility comments: pt up in chair on arrival    Transfers Overall transfer level: Needs assistance Equipment used: Rolling walker (2 wheels), None Transfers: Sit to/from Stand Sit to Stand: Min guard           General  transfer comment: with RW x 1; no device x 10 for strengthening    Ambulation/Gait Ambulation/Gait assistance: Min assist, +2 safety/equipment Gait Distance (Feet): 80 Feet Assistive device: Rolling walker (2 wheels), None Gait Pattern/deviations: Decreased stride length, Wide base of support, Step-through pattern   Gait velocity interpretation: 1.31 - 2.62 ft/sec, indicative of limited community ambulator   General Gait Details: initial 20 ft with RW and remainder with no device; unsteady and shakey but no overt LOB   Social research officer, government Rankin (Stroke Patients Only)       Balance Overall balance assessment: Needs assistance Sitting-balance support: Feet supported Sitting balance-Leahy Scale: Good     Standing balance support: No upper extremity supported Standing balance-Leahy Scale: Poor Standing balance comment: slight sway ant-post with feet in wide stance; with minguard can reach unilaterally overhead and 4" outside BOS (anterior reach); attempted SLS and unable to hold even 1 second                            Cognition Arousal/Alertness: Awake/alert Behavior During Therapy: Restless Overall Cognitive Status: Impaired/Different from baseline Area of Impairment: Orientation, Attention, Memory, Following commands, Safety/judgement, Awareness, Problem solving, Rancho level               Rancho Levels of Cognitive Functioning Rancho Mirant Scales of Cognitive Functioning: Confused, Inappropriate Non-Agitated Orientation Level: Disoriented to, Place, Time, Situation Current Attention Level: Sustained Memory: Decreased short-term memory, Decreased recall of precautions Following Commands: Follows one step commands consistently (  and with multimodal cues) Safety/Judgement: Decreased awareness of safety, Decreased awareness of deficits Awareness: Intellectual Problem Solving: Difficulty sequencing, Requires verbal  cues, Requires tactile cues General Comments: Cannot recall where he is even after told <2 minutes prior. Following all simple commands at appropriate rate.   Rancho Mirant Scales of Cognitive Functioning: Confused, Inappropriate Non-Agitated    Exercises      General Comments        Pertinent Vitals/Pain Pain Assessment Pain Assessment: No/denies pain Faces Pain Scale: No hurt Breathing: normal Negative Vocalization: none Facial Expression: smiling or inexpressive    Home Living                          Prior Function            PT Goals (current goals can now be found in the care plan section) Acute Rehab PT Goals Patient Stated Goal: to return home PT Goal Formulation: Patient unable to participate in goal setting Time For Goal Achievement: 01/16/23 Potential to Achieve Goals: Fair Progress towards PT goals: Progressing toward goals (Goals updated based on timeframe)    Frequency    Min 4X/week      PT Plan Current plan remains appropriate    Co-evaluation              AM-PAC PT "6 Clicks" Mobility   Outcome Measure  Help needed turning from your back to your side while in a flat bed without using bedrails?: A Little Help needed moving from lying on your back to sitting on the side of a flat bed without using bedrails?: A Little Help needed moving to and from a bed to a chair (including a wheelchair)?: A Little Help needed standing up from a chair using your arms (e.g., wheelchair or bedside chair)?: A Little Help needed to walk in hospital room?: A Little Help needed climbing 3-5 steps with a railing? : A Lot 6 Click Score: 17    End of Session Equipment Utilized During Treatment: Gait belt Activity Tolerance: Patient tolerated treatment well Patient left: with call bell/phone within reach;in chair;with chair alarm set Nurse Communication: Mobility status PT Visit Diagnosis: Other abnormalities of gait and mobility (R26.89);Muscle  weakness (generalized) (M62.81);Other symptoms and signs involving the nervous system (R29.898)     Time: 7846-9629 PT Time Calculation (min) (ACUTE ONLY): 22 min  Charges:  $Gait Training: 8-22 mins                      Jerolyn Center, PT Acute Rehabilitation Services  Office [phone]    Zena Amos 01/02/2023, 3:07 PM

## 2023-01-02 NOTE — Plan of Care (Signed)
  Problem: Education: Goal: Knowledge of General Education information will improve Description: Including pain rating scale, medication(s)/side effects and non-pharmacologic comfort measures Outcome: Progressing   Problem: Health Behavior/Discharge Planning: Goal: Ability to manage health-related needs will improve Outcome: Progressing   Problem: Clinical Measurements: Goal: Ability to maintain clinical measurements within normal limits will improve Outcome: Progressing Goal: Will remain free from infection Outcome: Progressing Goal: Diagnostic test results will improve Outcome: Progressing   Problem: Activity: Goal: Risk for activity intolerance will decrease Outcome: Progressing   Problem: Coping: Goal: Level of anxiety will decrease Outcome: Progressing   Problem: Elimination: Goal: Will not experience complications related to bowel motility Outcome: Progressing Goal: Will not experience complications related to urinary retention Outcome: Progressing   Problem: Pain Managment: Goal: General experience of comfort will improve Outcome: Progressing   Problem: Safety: Goal: Ability to remain free from injury will improve Outcome: Progressing   Problem: Skin Integrity: Goal: Risk for impaired skin integrity will decrease Outcome: Progressing   Problem: Safety: Goal: Non-violent Restraint(s) Outcome: Progressing   Problem: Education: Goal: Understanding of CV disease, CV risk reduction, and recovery process will improve Outcome: Progressing Goal: Individualized Educational Video(s) Outcome: Progressing   Problem: Activity: Goal: Ability to return to baseline activity level will improve Outcome: Progressing   Problem: Cardiovascular: Goal: Ability to achieve and maintain adequate cardiovascular perfusion will improve Outcome: Progressing Goal: Vascular access site(s) Level 0-1 will be maintained Outcome: Progressing   Problem: Health Behavior/Discharge  Planning: Goal: Ability to safely manage health-related needs after discharge will improve Outcome: Progressing   

## 2023-01-02 NOTE — Progress Notes (Signed)
Patient trying to get out of bed to go home. Patient stated he is just trying to get down stairs and he will figure out the rest. Patient gait very unsteady after night time medication. Posey belt placed. Trauma notified.

## 2023-01-02 NOTE — Progress Notes (Signed)
Progress Note     Subjective: Alert, expressive aphasia.  Per significant other he is eating a little better  Objective: Vital signs in last 24 hours: Temp:  [98.1 F (36.7 C)-99.4 F (37.4 C)] 98.1 F (36.7 C) (06/25 0744) Pulse Rate:  [90-98] 97 (06/25 0744) Resp:  [16-19] 16 (06/25 0744) BP: (106-131)/(68-94) 123/84 (06/25 0744) SpO2:  [94 %-97 %] 97 % (06/25 0744) Weight:  [92.1 kg] 92.1 kg (06/25 0338) Last BM Date : 01/01/23  Intake/Output from previous day: 06/24 0701 - 06/25 0700 In: 1040 [P.O.:1040] Out: 700 [Urine:700] Intake/Output this shift: No intake/output data recorded.  PE: Gen: comfortable, no distress Neuro: sleeping on exam Neck: supple CV: RRR Pulm: unlabored breathing on RA Abd: soft, NT    GU: urine clear and yellow, +spontaneous voids Extr: wwp, no edema   Lab Results:  No results for input(s): "WBC", "HGB", "HCT", "PLT" in the last 72 hours. BMET No results for input(s): "NA", "K", "CL", "CO2", "GLUCOSE", "BUN", "CREATININE", "CALCIUM" in the last 72 hours. PT/INR No results for input(s): "LABPROT", "INR" in the last 72 hours. CMP     Component Value Date/Time   NA 133 (L) 12/27/2022 0355   K 3.9 12/27/2022 0355   CL 100 12/27/2022 0355   CO2 25 12/27/2022 0355   GLUCOSE 84 12/27/2022 0355   BUN 21 (H) 12/27/2022 0355   CREATININE 0.61 12/27/2022 0355   CALCIUM 8.9 12/27/2022 0355   PROT 7.0 12/25/2022 0812   ALBUMIN 2.6 (L) 12/25/2022 0812   AST 34 12/25/2022 0812   ALT 74 (H) 12/25/2022 0812   ALKPHOS 107 12/25/2022 0812   BILITOT 0.4 12/25/2022 0812   GFRNONAA >60 12/27/2022 0355   Lipase  No results found for: "LIPASE"     Studies/Results: No results found.  Anti-infectives: Anti-infectives (From admission, onward)    Start     Dose/Rate Route Frequency Ordered Stop   12/26/22 2200  vancomycin (VANCOREADY) IVPB 1500 mg/300 mL        1,500 mg 150 mL/hr over 120 Minutes Intravenous Every 8 hours 12/25/22 1653  12/31/22 1733   12/24/22 0200  vancomycin (VANCOREADY) IVPB 1250 mg/250 mL  Status:  Discontinued        1,250 mg 166.7 mL/hr over 90 Minutes Intravenous Every 12 hours 12/23/22 1342 12/25/22 1653   12/23/22 1430  vancomycin (VANCOREADY) IVPB 2000 mg/400 mL        2,000 mg 200 mL/hr over 120 Minutes Intravenous  Once 12/23/22 1342 12/23/22 1641   12/22/22 1100  ceFEPIme (MAXIPIME) 2 g in sodium chloride 0.9 % 100 mL IVPB        2 g 200 mL/hr over 30 Minutes Intravenous Every 8 hours 12/22/22 0949 12/29/22 1927   12/11/22 1115  cefTRIAXone (ROCEPHIN) 2 g in sodium chloride 0.9 % 100 mL IVPB        2 g 200 mL/hr over 30 Minutes Intravenous Every 24 hours 12/11/22 1020 12/15/22 1117        Assessment/Plan  fall   TBI with SAH, SDH - NSGY c/s, Dr. Franky Macho, monitor neuro exam, keppra x7d for sz ppx. Repeat CT head 6/1 stable. Repeat CTA head/neck 6/5. EEG negative 6/5. F/C at times now CV - added scheduled propranolol 6/12 with TBI, tachy, HTN - improved.  Agitation/Sedation:  more alert and agitated. 200 mg seroquel at bedtime and 100 mg seroquel Q AM. Schedule 0.25 mg klonopin BID, prn ativan Acute hypoxic respiratory failure:  extubated 6/10 R  temporal bone/mastoid fx, parietal skull fx - NSGY c/s, Dr. Franky Macho, ENT Jearld Fenton) recs audiogram once extubated. No operative intervention required. 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 ASA 81 per Dr. Franky Macho. Fx thru sphenoid bones into carotid canals; vertical mastoid fx Alcohol intoxication - CIWA, TOC c/s. Thiamine, folate, multi.  Mild elevated transaminases  Suspected aspiration PNA, resolved- resp cx 12/11/22  H flu, ceftriaxone x5d (completed 6/7) Facial itching - topical benadryl cream ID/Fever - Rocephin 6/3 - 6/7 for PNA. Vanc completed for MRSA PNA  Foley - d/c 6/11, good UOP FEN - DYS 3/nectar started 6/18, Cortrak out - monitor out  for the next 48 hrs  DVT - SCDs, LMWH Dispo - 4NP, therapies - CIR following. Ritalin trial per Dr. Riley Kill.  Stable for D/C to CIR    LOS: 25 days   I reviewed nursing notes, last 24 h vitals and pain scores, and last 48 h intake and output.   Adam Phenix, [facility] 01/02/2023, 10:11 AM Please see Amion for pager number during day hours 7:00am-4:30pm

## 2023-01-02 NOTE — Progress Notes (Signed)
Inpatient Rehab Admissions Coordinator:    I continue to await insurance auth for CIR. I will continue to follow for potential admit pending approval from insurance.  Megan Salon, MS, CCC-SLP Rehab Admissions Coordinator  [phone] (celll) [phone] (office)

## 2023-01-02 NOTE — Progress Notes (Signed)
Patient able to get out of restraints. Patient fell. No signs of injury. Trauma notified.

## 2023-01-03 MED ORDER — LORAZEPAM 2 MG/ML IJ SOLN
0.5000 mg | Freq: Three times a day (TID) | INTRAMUSCULAR | Status: DC | PRN
  Administered 2023-01-03 – 2023-01-04 (×2): 1 mg via INTRAVENOUS
  Filled 2023-01-03: qty 1

## 2023-01-03 MED ORDER — LORAZEPAM 2 MG/ML IJ SOLN
1.0000 mg | Freq: Once | INTRAMUSCULAR | Status: AC
  Administered 2023-01-03: 1 mg via INTRAVENOUS
  Filled 2023-01-03: qty 1

## 2023-01-03 NOTE — Progress Notes (Signed)
Progress Note     Subjective: Alert, expressive aphasia. Less frustrated and more pleasant today.  Not eating well when his significant other is not there.    Objective: Vital signs in last 24 hours: Temp:  [97.7 F (36.5 C)-98.7 F (37.1 C)] 97.9 F (36.6 C) (06/26 0311) Pulse Rate:  [89-97] 89 (06/26 0311) Resp:  [16-20] 20 (06/26 0311) BP: (101-134)/(64-84) 101/66 (06/26 0311) SpO2:  [96 %-97 %] 97 % (06/26 0311) Last BM Date : 01/01/23  Intake/Output from previous day: 06/25 0701 - 06/26 0700 In: 240 [P.O.:240] Out: -  Intake/Output this shift: No intake/output data recorded.  PE: Gen: comfortable, no distress Neuro: sleeping on exam Neck: supple CV: RRR Pulm: unlabored breathing on RA Abd: soft, NT    GU: urine clear and yellow, +spontaneous voids Extr: wwp, no edema   Lab Results:  No results for input(s): "WBC", "HGB", "HCT", "PLT" in the last 72 hours. BMET No results for input(s): "NA", "K", "CL", "CO2", "GLUCOSE", "BUN", "CREATININE", "CALCIUM" in the last 72 hours. PT/INR No results for input(s): "LABPROT", "INR" in the last 72 hours. CMP     Component Value Date/Time   NA 133 (L) 12/27/2022 0355   K 3.9 12/27/2022 0355   CL 100 12/27/2022 0355   CO2 25 12/27/2022 0355   GLUCOSE 84 12/27/2022 0355   BUN 21 (H) 12/27/2022 0355   CREATININE 0.61 12/27/2022 0355   CALCIUM 8.9 12/27/2022 0355   PROT 7.0 12/25/2022 0812   ALBUMIN 2.6 (L) 12/25/2022 0812   AST 34 12/25/2022 0812   ALT 74 (H) 12/25/2022 0812   ALKPHOS 107 12/25/2022 0812   BILITOT 0.4 12/25/2022 0812   GFRNONAA >60 12/27/2022 0355   Lipase  No results found for: "LIPASE"     Studies/Results: No results found.  Anti-infectives: Anti-infectives (From admission, onward)    Start     Dose/Rate Route Frequency Ordered Stop   12/26/22 2200  vancomycin (VANCOREADY) IVPB 1500 mg/300 mL        1,500 mg 150 mL/hr over 120 Minutes Intravenous Every 8 hours 12/25/22 1653  12/31/22 1733   12/24/22 0200  vancomycin (VANCOREADY) IVPB 1250 mg/250 mL  Status:  Discontinued        1,250 mg 166.7 mL/hr over 90 Minutes Intravenous Every 12 hours 12/23/22 1342 12/25/22 1653   12/23/22 1430  vancomycin (VANCOREADY) IVPB 2000 mg/400 mL        2,000 mg 200 mL/hr over 120 Minutes Intravenous  Once 12/23/22 1342 12/23/22 1641   12/22/22 1100  ceFEPIme (MAXIPIME) 2 g in sodium chloride 0.9 % 100 mL IVPB        2 g 200 mL/hr over 30 Minutes Intravenous Every 8 hours 12/22/22 0949 12/29/22 1927   12/11/22 1115  cefTRIAXone (ROCEPHIN) 2 g in sodium chloride 0.9 % 100 mL IVPB        2 g 200 mL/hr over 30 Minutes Intravenous Every 24 hours 12/11/22 1020 12/15/22 1117        Assessment/Plan  fall   TBI with SAH, SDH - NSGY c/s, Dr. Franky Macho, monitor neuro exam, keppra x7d for sz ppx. Repeat CT head 6/1 stable. Repeat CTA head/neck 6/5. EEG negative 6/5. F/C at times now CV - added scheduled propranolol 6/12 with TBI, tachy, HTN - improved.  Agitation/Sedation:  more alert and agitated. 200 mg seroquel at bedtime and 100 mg seroquel Q AM. 0.25 mg klonopin BID, prn ativan Acute hypoxic respiratory failure:  extubated 6/10 R  temporal bone/mastoid fx, parietal skull fx - NSGY c/s, Dr. Franky Macho, ENT Jearld Fenton) recs audiogram once extubated. No operative intervention required. 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 ASA 81 per Dr. Franky Macho. Fx thru sphenoid bones into carotid canals; vertical mastoid fx Alcohol intoxication - CIWA, TOC c/s. Thiamine, folate, multi.  Mild elevated transaminases  Suspected aspiration PNA, resolved- resp cx 12/11/22  H flu, ceftriaxone x5d (completed 6/7) Facial itching - topical benadryl cream ID/Fever - Rocephin 6/3 - 6/7 for PNA. Vanc completed for MRSA PNA  Foley - d/c 6/11, good UOP FEN - DYS 3/nectar started 6/18, Cortrak out - monitor out for the  next 48 hrs  DVT - SCDs, LMWH Dispo - 4NP, therapies - CIR following. Ritalin trial per Dr. Riley Kill.  Stable for D/C to CIR  Fall overnight noted, no additional workup necessary, will order a vail bed. Will consider increasing klonopin if he remains alert, agitated and impulsive  LOS: 26 days   I reviewed nursing notes, last 24 h vitals and pain scores, and last 48 h intake and output.   Adam Phenix, [facility] 01/03/2023, 9:13 AM Please see Amion for pager number during day hours 7:00am-4:30pm

## 2023-01-03 NOTE — Progress Notes (Signed)
Nutrition Follow-up  DOCUMENTATION CODES:   Not applicable  INTERVENTION:   - If PO intake does not improve with the interventions below, recommend considering PEG tube placement. Pt has pulled out multiple Cortrak tubes and has experienced an 8% weight loss in less than 1 month which is severe and significant for timeframe.  - Continue MVI with minerals, thiamine, and folic acid daily  - Mighty Shake TID with meals, each supplement provides 330 kcals and 9 grams of protein  - Magic Cup TID with meals, each supplement provides 290 kcal and 9 grams of protein  - Ordering feeding assistance with meals  NUTRITION DIAGNOSIS:   Increased nutrient needs related to (TBI) as evidenced by estimated needs.  Ongoing, being addressed via diet advancement and oral nutrition supplements  GOAL:   Patient will meet greater than or equal to 90% of their needs  Unmet at this time  MONITOR:   PO intake, Supplement acceptance, Diet advancement, Weight trends, TF tolerance, I & O's  REASON FOR ASSESSMENT:   Consult Enteral/tube feeding initiation and management  ASSESSMENT:   Pt admitted after fall with TBI with SAH, SDH, R temporal bone fx, R parietal fx, possible ICA dissection, R mastoid fx, fx through spenhoids, vertical mastoid fx.  06/03 - Cortrak placed (no bridle per MD) 06/06 - Cortrak tube became dislodged, replaced with NG tube 06/10 - extubated 06/12 - Cortrak + bridle placed (tip distal stomach or proximal duodenum) 06/18 - diet advanced to dysphagia 3 with nectar-thick liquids 06/19 - transition to nocturnal TF 06/20 - vomiting, nocturnal TF paused 06/21 - pt pulled out Cortrak, Cortrak replaced (tip distal stomach) 06/24 - pt pulled out Cortrak  Noted pt pulled out Cortrak again on 6/24. Documented PO intake has been inadequate.  Discussed pt with NT and RN via secure chat. NT reports that pt received his meal prior to her arrival and that he had mixed together his  sausage, milk, and oatmeal in a bowl but had not eaten any of it.  Upon RD arrival, pt was sound asleep and did not awaken to RD voice. Sitter in room reports that pt has not eaten anything since she arrived.  Pt has experienced an 8% weight loss since 12/08/22. This is severe and significant for timeframe. Because of poor PO intake, weight loss, and pt pulling out multiple Cortrak tubes, recommend considering PEG tube placement if PO intake does not improve. Reached out to Trauma PA regarding recommendations. Per PA, pt eats better when his significant other is present. PA to discuss nutrition support with MD.  RD will order additional oral nutrition supplements in an effort to maximize kcal and protein intake via PO route.  Admit weight: 100 kg Current weight: 92.1 kg  Meal Completion: 0-20%  Medications reviewed and include: folic acid, ritalin, MVI with minerals, thiamine  Labs reviewed. CBG's: 92-120 x 24 hours  Diet Order:   Diet Order             DIET DYS 3 Room service appropriate? No; Fluid consistency: Nectar Thick  Diet effective now                   EDUCATION NEEDS:   Not appropriate for education at this time  Skin:  Skin Assessment: Reviewed RN Assessment  Last BM:  01/01/23  Height:   Ht Readings from Last 1 Encounters:  12/13/22 5\' 10"  (1.778 m)    Weight:   Wt Readings from Last 1 Encounters:  01/02/23 92.1 kg    BMI:  Body mass index is 29.13 kg/m.  Estimated Nutritional Needs:   Kcal:  2400-2600  Protein:  125-145 grams  Fluid:  >2L/day    Mertie Clause, MS, RD, LDN Inpatient Clinical Dietitian Please see AMiON for contact information.

## 2023-01-03 NOTE — Progress Notes (Signed)
Able to get in touch with patient's mother this morning to inform her of last night's fall, and the need to replace restraints and safety sitter.

## 2023-01-03 NOTE — Progress Notes (Signed)
Occupational Therapy Treatment Patient Details Name: Shawn Mills MRN: [id number] DOB: 06/25/1981 Today's Date: 01/03/2023   History of present illness 42 yo male s/p fall TBI with SAH, SDH, R temporal bone, vertical mastoid fx, parietal skull fx intubated 5/31- extubated 6/10 possible ICA dissection, fx sphenoid bones into carotid canals CIWA ETOH + PMH: none   OT comments  Pt demonstrates Rancho IV confused agitation during session today. Pt randomly expressing need to "go home" and lack of awareness to deficits. Pt demonstrates word finding deficits (see SLP treatment note). Pt with balance deficits increased this session compared to prior sessions. Pt noted to have x3 dosages of ativan prior to the session 01/03/23 med list and question if this could contribute to increased balance deficits. Recommendation for Patient will benefit from intensive inpatient follow up therapy, >3 hours/day  Pt would benefit from enclosure bed to reduce restraints with safety in bed.    Recommendations for follow up therapy are one component of a multi-disciplinary discharge planning process, led by the attending physician.  Recommendations may be updated based on patient status, additional functional criteria and insurance authorization.    Assistance Recommended at Discharge Frequent or constant Supervision/Assistance  Patient can return home with the following  Two people to help with bathing/dressing/bathroom;Two people to help with walking and/or transfers;Assistance with feeding;Assistance with cooking/housework;Assist for transportation;Direct supervision/assist for financial management;Direct supervision/assist for medications management;Help with stairs or ramp for entrance   Equipment Recommendations  Wheelchair (measurements OT);Wheelchair cushion (measurements OT);Other (comment);BSC/3in1    Recommendations for Other Services Rehab consult    Precautions / Restrictions Precautions Precautions:  Fall Precaution Comments: wasit posey bed restraint       Mobility Bed Mobility Overal bed mobility: Needs Assistance Bed Mobility: Supine to Sit, Sit to Supine Rolling: Mod assist   Supine to sit: Mod assist Sit to supine: Supervision        Transfers Overall transfer level: Needs assistance Equipment used: None Transfers: Sit to/from Stand Sit to Stand: Min guard           General transfer comment: pt powers up and then needs increased (A) to mod with transfer     Balance Overall balance assessment: Needs assistance Sitting-balance support: Bilateral upper extremity supported, Feet supported Sitting balance-Leahy Scale: Poor     Standing balance support: Bilateral upper extremity supported, During functional activity, Reliant on assistive device for balance Standing balance-Leahy Scale: Poor                             ADL either performed or assessed with clinical judgement   ADL Overall ADL's : Needs assistance/impaired Eating/Feeding: Supervision/ safety Eating/Feeding Details (indicate cue type and reason): drinking from cup appropriately and pour soda into cup without spillage Grooming: Wash/dry hands;Oral care;Maximal assistance Grooming Details (indicate cue type and reason): pt needs mod cues to walk to sink with door closed to bathroom to direct patient to a sink with auditory sound of running water. OT placing soap on the posterior aspect of hand and pt initiates task in detail. pt asked to brush teeth. pt drops first tooth brush then terminates task         Upper Body Dressing : Minimal assistance;Sitting Upper Body Dressing Details (indicate cue type and reason): pt needed increased time and therapist to untie the gown to help him succeed in doff of gown. pt becoming increasingly irritated by gown and calm iwth removal   Lower Body  Dressing Details (indicate cue type and reason): pt reaches for socks and touches them already don Toilet  Transfer: Moderate assistance Toilet Transfer Details (indicate cue type and reason): pt unsteady gait to bathroom and needs steady assist to static stand. pt states "i got this!" with increased voice volume to indicate no need for help. OT giving mod (A) to prevent fall and pt holding with R ue on rail. pt does not attempt to aim urine and is positioned very close to the toilet to avoid peeing on his feet         Functional mobility during ADLs: Moderate assistance (heavy mod at times) General ADL Comments: pt needed auditory cues to initiate.pt after hearing commode flush and refusing out of bed states "i am going to go in here now and use this toilet. i am not doing none of that". Pt  was asked to complete commode transfer demonstrating poor STM.    Extremity/Trunk Assessment Upper Extremity Assessment Upper Extremity Assessment: Generalized weakness   Lower Extremity Assessment Lower Extremity Assessment: Generalized weakness        Vision       Perception     Praxis      Cognition Arousal/Alertness: Awake/alert Behavior During Therapy: Flat affect Overall Cognitive Status: Impaired/Different from baseline Area of Impairment: Orientation, Attention, Memory, Following commands, Safety/judgement, Awareness, Problem solving, Rancho level               Rancho Levels of Cognitive Functioning Rancho Mirant Scales of Cognitive Functioning: Confused/Agitated Orientation Level: Disoriented to, Place, Time, Situation Current Attention Level: Sustained Memory: Decreased recall of precautions, Decreased short-term memory Following Commands: Follows one step commands inconsistently, Follows one step commands with increased time Safety/Judgement: Decreased awareness of safety, Decreased awareness of deficits Awareness: Intellectual Problem Solving: Slow processing, Decreased initiation, Requires verbal cues, Requires tactile cues General Comments: Pt asleep on arrival and  requires increased time to arouse. pt responding to greeting with middle finger (3rd digit extension). pt with word finding deficits. pt throughout the session verbalize "lets go home" pt verbalize to call girlfriend and unable to recall her name when asked. pt does recognize written "Efraim Kaufmann' and states "thats what the hell i said." Pt giving vague directions "go out here take a right then a left and then go" Pt asked go where " he states "[address]" -- the directions were correct to [address]. Pt easily irriated by brisk questioning this session compared to prior sessions. pt once sitting doff restraints and gown. pt nude except for his snoopy hat the entire session. At the end of session pt agreeable to reapply restraint and gown. Pt provided drink which he appropriate opened and poured in cup. pt provided phone but does not utilize it despite being increasingly agitated over needing fiance called. Rancho Mirant Scales of Cognitive Functioning: Confused/Agitated      Exercises      Shoulder Instructions       General Comments VSS on RA- pt very sleepy on arrival. Question if lethargic state and increased agitation due to medications    Pertinent Vitals/ Pain       Pain Assessment Pain Assessment: No/denies pain  Home Living                                          Prior Functioning/Environment  Frequency  Min 2X/week        Progress Toward Goals  OT Goals(current goals can now be found in the care plan section)  Progress towards OT goals: Progressing toward goals  Acute Rehab OT Goals Patient Stated Goal: to get the hell out of here. do you have a cigarette OT Goal Formulation: Patient unable to participate in goal setting Time For Goal Achievement: 01/15/23 Potential to Achieve Goals: Good ADL Goals Pt Will Perform Grooming: with min guard assist;sitting Additional ADL Goal #1: Pt will locate 2 adl items on sink to complete grooming  task. Additional ADL Goal #2: pt will sit eob supervision level Additional ADL Goal #3: pt will complete 3 step command 50% of attempts  Plan Discharge plan remains appropriate    Co-evaluation    PT/OT/SLP Co-Evaluation/Treatment: Yes     OT goals addressed during session: ADL's and self-care;Strengthening/ROM      AM-PAC OT "6 Clicks" Daily Activity     Outcome Measure   Help from another person eating meals?: A Little Help from another person taking care of personal grooming?: A Lot Help from another person toileting, which includes using toliet, bedpan, or urinal?: A Lot Help from another person bathing (including washing, rinsing, drying)?: A Lot Help from another person to put on and taking off regular upper body clothing?: A Lot Help from another person to put on and taking off regular lower body clothing?: A Lot 6 Click Score: 13    End of Session    OT Visit Diagnosis: Unsteadiness on feet (R26.81);Muscle weakness (generalized) (M62.81)   Activity Tolerance Patient tolerated treatment well   Patient Left in bed;with call bell/phone within reach;with bed alarm set;with nursing/sitter in room;with restraints reapplied   Nurse Communication Mobility status;Precautions        Time: 5366-4403 OT Time Calculation (min): 30 min  Charges: OT General Charges $OT Visit: 1 Visit OT Treatments $Self Care/Home Management : 23-37 mins   Brynn, OTR/L  Acute Rehabilitation Services Office: [phone] .   Mateo Flow 01/03/2023, 4:31 PM

## 2023-01-03 NOTE — Progress Notes (Addendum)
Speech Language Pathology Treatment: Cognitive-Linquistic  Patient Details Name: Shawn Mills MRN: [id number] DOB: 1981-05-22 Today's Date: 01/03/2023 Time: 8657-8469 SLP Time Calculation (min) (ACUTE ONLY): 35 min  Assessment / Plan / Recommendation Clinical Impression  Pt seen with OT to address mobility and pt/therapist safety. Pt presents as a Rancho IV, confused and agitated with restless behavior. Pt easily distracted by sensory stimulation. Ripped off his clothes and all linens from bed removed to be able to redirect pt to functional task. Reduced visual and tactile stimuli repeatedly to direct pt. Drew pt to bathroom by directing gaze and flushing toilet. Pt impulsive with no safety awareness. Demonstrates ongoing aphasia with comprehension impairment. Benefits from simple language and visual/written cues though he still not focusing attention to therapist directed tasks. Therapists followed his lead in toileting, hand washing, dressing and self feeding. He is able to follow sequences of familiar tasks well. Pt tolerating thin liquids today, will upgrade to regular/thin. Pt has not been on tube feeding since 6/24. Pt would benefit from intensive rehabilitation.    HPI HPI: 42 yo male s/p fall TBI with SAH, SDH, R temporal bone, vertical mastoid fx, parietal skull fx intubated 5/31- extubated 6/10 possible ICA dissection, fx sphenoid bones into carotid canals CIWA ETOH + PMH: none      SLP Plan  Continue with current plan of care      Recommendations for follow up therapy are one component of a multi-disciplinary discharge planning process, led by the attending physician.  Recommendations may be updated based on patient status, additional functional criteria and insurance authorization.    Recommendations  Diet recommendations: Regular;Thin liquid Liquids provided via: Straw Medication Administration: Whole meds with liquid Supervision: Staff to assist with self  feeding Compensations: Slow rate;Small sips/bites Postural Changes and/or Swallow Maneuvers: Seated upright 90 degrees                Rehab consult Oral care BID   Frequent or constant Supervision/Assistance Cognitive communication deficit (G29.528)     Continue with current plan of care     Kanyah Matsushima, Riley Nearing  01/03/2023, 2:11 PM

## 2023-01-03 NOTE — Progress Notes (Signed)
Patient arrived from [facility], ambulated to restroom, oriented to room and positioned in [place] enclosure bed with 1:1 sitter present.

## 2023-01-03 NOTE — Progress Notes (Signed)
Tried to call patient's mother to let her know about the fall. No answer. Will try back in the morning.

## 2023-01-03 NOTE — Progress Notes (Signed)
Physical Therapy Treatment Patient Details Name: Shawn Mills MRN: [id number] DOB: 09/14/1980 Today's Date: 01/03/2023   History of Present Illness 42 yo male s/p fall TBI with SAH, SDH, R temporal bone, vertical mastoid fx, parietal skull fx intubated 5/31- extubated 6/10 possible ICA dissection, fx sphenoid bones into carotid canals CIWA ETOH + PMH: none    PT Comments    Noted pt with fall trying to get OOB last night. Patient slightly more lethargic and ?due to meds. Slightly more unsteady compared to 6/25 and pt seeking UE support as walking around room. Able to at least attempt SLS 6/25 and too unsteady today to try this. Memory continues to be very poor as he cannot recall information even seconds after he is told (even when have pt repeat information, he then cannot recall after he says it).     Recommendations for follow up therapy are one component of a multi-disciplinary discharge planning process, led by the attending physician.  Recommendations may be updated based on patient status, additional functional criteria and insurance authorization.  Follow Up Recommendations       Assistance Recommended at Discharge Frequent or constant Supervision/Assistance  Patient can return home with the following Two people to help with walking and/or transfers;Assistance with cooking/housework;Assist for transportation;Help with stairs or ramp for entrance;Two people to help with bathing/dressing/bathroom   Equipment Recommendations  Other (comment) (TBA)    Recommendations for Other Services       Precautions / Restrictions Precautions Precautions: Fall Precaution Comments: waist chair alarm Restrictions Weight Bearing Restrictions: No     Mobility  Bed Mobility Overal bed mobility: Needs Assistance Bed Mobility: Supine to Sit, Sit to Supine     Supine to sit: Min guard Sit to supine: Min guard   General bed mobility comments: in bed with waist restraint; guarding for  safety due to decr awareness with fall trying to get OOB last night    Transfers Overall transfer level: Needs assistance Equipment used: None Transfers: Sit to/from Stand Sit to Stand: Min assist           General transfer comment: less steady with incr ant-post sway upon standing; repeated x 10 reps for strengthening    Ambulation/Gait Ambulation/Gait assistance: Min assist Gait Distance (Feet): 35 Feet Assistive device: None (pt reaching to hold onto bed, counter, etc) Gait Pattern/deviations: Decreased stride length, Wide base of support, Step-through pattern, Drifts right/left   Gait velocity interpretation: <1.8 ft/sec, indicate of risk for recurrent falls   General Gait Details: less steady this morning (?med effects); pt seeking UE support where he was not yesterday   Stairs             Wheelchair Mobility    Modified Rankin (Stroke Patients Only)       Balance Overall balance assessment: Needs assistance Sitting-balance support: Feet supported Sitting balance-Leahy Scale: Good     Standing balance support: No upper extremity supported Standing balance-Leahy Scale: Poor Standing balance comment: slight sway ant-post with feet in wide stance; with minguard can reach unilaterally overhead  and stand with eyes closed x 10 sec with slight sway                            Cognition Arousal/Alertness: Awake/alert Behavior During Therapy: Flat affect Overall Cognitive Status: Impaired/Different from baseline Area of Impairment: Orientation, Attention, Memory, Following commands, Safety/judgement, Awareness, Problem solving, Rancho level  Rancho Levels of Cognitive Functioning Rancho Los Amigos Scales of Cognitive Functioning: Confused, Inappropriate Non-Agitated Orientation Level: Disoriented to, Place, Time, Situation Current Attention Level: Sustained Memory: Decreased short-term memory, Decreased recall of  precautions Following Commands: Follows one step commands consistently (with only verbal cues) Safety/Judgement: Decreased awareness of safety, Decreased awareness of deficits Awareness: Intellectual Problem Solving: Difficulty sequencing, Requires verbal cues, Requires tactile cues General Comments: Cannot recall where he is even after told <2 minutes prior. Expressive impairment with non-sensical words used. Following all simple commands at appropriate rate.   Rancho Mirant Scales of Cognitive Functioning: Confused, Inappropriate Non-Agitated    Exercises      General Comments        Pertinent Vitals/Pain Pain Assessment Pain Assessment: Faces Faces Pain Scale: Hurts little more Pain Location: back Pain Descriptors / Indicators: Moaning Pain Intervention(s): Limited activity within patient's tolerance, Monitored during session, Repositioned    Home Living                          Prior Function            PT Goals (current goals can now be found in the care plan section) Acute Rehab PT Goals Patient Stated Goal: to return home PT Goal Formulation: Patient unable to participate in goal setting Time For Goal Achievement: 01/16/23 Potential to Achieve Goals: Fair Progress towards PT goals: Not progressing toward goals - comment (less steady this date)    Frequency    Min 4X/week      PT Plan Current plan remains appropriate    Co-evaluation              AM-PAC PT "6 Clicks" Mobility   Outcome Measure  Help needed turning from your back to your side while in a flat bed without using bedrails?: A Little Help needed moving from lying on your back to sitting on the side of a flat bed without using bedrails?: A Little Help needed moving to and from a bed to a chair (including a wheelchair)?: A Little Help needed standing up from a chair using your arms (e.g., wheelchair or bedside chair)?: A Little Help needed to walk in hospital room?: A  Little Help needed climbing 3-5 steps with a railing? : A Lot 6 Click Score: 17    End of Session Equipment Utilized During Treatment: Gait belt Activity Tolerance: Patient tolerated treatment well Patient left: in bed;with nursing/sitter in room;with restraints reapplied (waist restraint)   PT Visit Diagnosis: Other abnormalities of gait and mobility (R26.89);Muscle weakness (generalized) (M62.81);Other symptoms and signs involving the nervous system (Z61.096)     Time: 0454-0981 PT Time Calculation (min) (ACUTE ONLY): 16 min  Charges:  $Gait Training: 8-22 mins                      Jerolyn Center, PT Acute Rehabilitation Services  Office [phone]    Zena Amos 01/03/2023, 9:48 AM

## 2023-01-03 NOTE — Progress Notes (Signed)
Inpatient Rehab Admissions Coordinator:    CIR continues to follow. Insurance case remains pending. Note Pt. Was confused overnight with a fall and now requiring 1:1 sitter. He would likely need a [place] bed while on CIR; however, would not be able to place pt. In [place] bed if he is receiving tube feeds.   Megan Salon, MS, CCC-SLP Rehab Admissions Coordinator  [phone] (celll) [phone] (office)

## 2023-01-03 NOTE — Progress Notes (Signed)
..  Trauma Event Note    Reason for Call :   2224- Received call from primary RN [facility] requesting to renew restraint order due to increased confusion and attempts to get out of bed she reports she has already given all available PRN medications and night meds. OK to renew. 2314-Received call from primary RN Corrie Dandy, she reports pt slipped out of posey belt and removed wrist restraints with his teeth and fell to the floor on the R side of the bed, floor mat was present.  I arrived at bedside to find pt sitting in chair, alert but confused, repeatedly asking for cigarettes. Pt reports no pain at this time, small scab between eyebrows Corrie Dandy reports was already present. Pt moving all extremities, no obvious injuries, mental status appears to be consistent with notes from earlier in the day.  Order placed for 1:1 sitter, [facility] John notified of fall as well as need for Recruitment consultant, he advised me to contact staffing. Staffing contacted they will send someone if available.  While Corrie Dandy was at bedside pt again insisted on ambulating very unsteadily into the hall, Corrie Dandy remained at his side, I joined her and we assisted pt back to room. Pt is a 2+ assist.  Dr. Dossie Der updated via phone, VO for Ativan 1mg  IV given and entered.  Fall huddle completed with CN and primary RN after sitter arrived. Corrie Dandy will call family and notify them of fall per protocol.     Last imported Vital Signs BP 101/66 (BP Location: Right Arm)   Pulse 89   Temp 97.9 F (36.6 C) (Oral)   Resp 20   Ht 5\' 10"  (1.778 m)   Wt 203 lb 0.7 oz (92.1 kg)   SpO2 97%   BMI 29.13 kg/m   Trending CBC No results for input(s): "WBC", "HGB", "HCT", "PLT" in the last 72 hours.  Trending Coag's No results for input(s): "APTT", "INR" in the last 72 hours.  Trending BMET No results for input(s): "NA", "K", "CL", "CO2", "BUN", "CREATININE", "GLUCOSE" in the last 72 hours.    Lynnetta Tom Dee  Trauma Response RN  Please call TRN at  [phone] for further assistance.

## 2023-01-04 ENCOUNTER — Inpatient Hospital Stay (HOSPITAL_COMMUNITY)
Admission: RE | Admit: 2023-01-04 | Discharge: 2023-01-23 | DRG: 945 | Disposition: A | Discharge status: Home / Self Care | Payer: 59 | Source: Intra-hospital | Attending: Physical Medicine and Rehabilitation | Admitting: Physical Medicine and Rehabilitation

## 2023-01-04 ENCOUNTER — Encounter (HOSPITAL_COMMUNITY): Payer: Self-pay

## 2023-01-04 DIAGNOSIS — S020XXD Fracture of vault of skull, subsequent encounter for fracture with routine healing: Secondary | ICD-10-CM | POA: Diagnosis not present

## 2023-01-04 DIAGNOSIS — R4587 Impulsiveness: Secondary | ICD-10-CM | POA: Diagnosis present

## 2023-01-04 DIAGNOSIS — Z79899 Other long term (current) drug therapy: Secondary | ICD-10-CM

## 2023-01-04 DIAGNOSIS — M549 Dorsalgia, unspecified: Secondary | ICD-10-CM | POA: Diagnosis not present

## 2023-01-04 DIAGNOSIS — Z87891 Personal history of nicotine dependence: Secondary | ICD-10-CM

## 2023-01-04 DIAGNOSIS — S069XAA Unspecified intracranial injury with loss of consciousness status unknown, initial encounter: Secondary | ICD-10-CM | POA: Diagnosis present

## 2023-01-04 DIAGNOSIS — S0292XD Unspecified fracture of facial bones, subsequent encounter for fracture with routine healing: Secondary | ICD-10-CM | POA: Diagnosis not present

## 2023-01-04 DIAGNOSIS — R1310 Dysphagia, unspecified: Secondary | ICD-10-CM | POA: Diagnosis present

## 2023-01-04 DIAGNOSIS — S0219XD Other fracture of base of skull, subsequent encounter for fracture with routine healing: Secondary | ICD-10-CM

## 2023-01-04 DIAGNOSIS — S065XAD Traumatic subdural hemorrhage with loss of consciousness status unknown, subsequent encounter: Secondary | ICD-10-CM

## 2023-01-04 DIAGNOSIS — M79661 Pain in right lower leg: Secondary | ICD-10-CM | POA: Diagnosis not present

## 2023-01-04 DIAGNOSIS — G479 Sleep disorder, unspecified: Secondary | ICD-10-CM | POA: Diagnosis not present

## 2023-01-04 DIAGNOSIS — S069X0A Unspecified intracranial injury without loss of consciousness, initial encounter: Secondary | ICD-10-CM

## 2023-01-04 DIAGNOSIS — S15091D Other specified injury of right carotid artery, subsequent encounter: Secondary | ICD-10-CM

## 2023-01-04 DIAGNOSIS — G8929 Other chronic pain: Secondary | ICD-10-CM | POA: Diagnosis present

## 2023-01-04 DIAGNOSIS — K5900 Constipation, unspecified: Secondary | ICD-10-CM | POA: Diagnosis not present

## 2023-01-04 DIAGNOSIS — M25561 Pain in right knee: Secondary | ICD-10-CM | POA: Diagnosis not present

## 2023-01-04 DIAGNOSIS — S069X3S Unspecified intracranial injury with loss of consciousness of 1 hour to 5 hours 59 minutes, sequela: Secondary | ICD-10-CM | POA: Diagnosis not present

## 2023-01-04 DIAGNOSIS — I6202 Nontraumatic subacute subdural hemorrhage: Secondary | ICD-10-CM | POA: Diagnosis not present

## 2023-01-04 DIAGNOSIS — Z781 Physical restraint status: Secondary | ICD-10-CM | POA: Diagnosis not present

## 2023-01-04 DIAGNOSIS — I7771 Dissection of carotid artery: Secondary | ICD-10-CM | POA: Diagnosis not present

## 2023-01-04 DIAGNOSIS — W19XXXD Unspecified fall, subsequent encounter: Secondary | ICD-10-CM | POA: Diagnosis present

## 2023-01-04 DIAGNOSIS — R451 Restlessness and agitation: Secondary | ICD-10-CM | POA: Diagnosis not present

## 2023-01-04 DIAGNOSIS — S069XAS Unspecified intracranial injury with loss of consciousness status unknown, sequela: Secondary | ICD-10-CM | POA: Diagnosis not present

## 2023-01-04 DIAGNOSIS — S069X1S Unspecified intracranial injury with loss of consciousness of 30 minutes or less, sequela: Secondary | ICD-10-CM | POA: Diagnosis not present

## 2023-01-04 DIAGNOSIS — Z7141 Alcohol abuse counseling and surveillance of alcoholic: Secondary | ICD-10-CM | POA: Diagnosis not present

## 2023-01-04 DIAGNOSIS — G9389 Other specified disorders of brain: Secondary | ICD-10-CM | POA: Diagnosis not present

## 2023-01-04 DIAGNOSIS — F1010 Alcohol abuse, uncomplicated: Secondary | ICD-10-CM | POA: Diagnosis not present

## 2023-01-04 DIAGNOSIS — Z7982 Long term (current) use of aspirin: Secondary | ICD-10-CM | POA: Diagnosis not present

## 2023-01-04 DIAGNOSIS — S069XAD Unspecified intracranial injury with loss of consciousness status unknown, subsequent encounter: Secondary | ICD-10-CM

## 2023-01-04 DIAGNOSIS — G629 Polyneuropathy, unspecified: Secondary | ICD-10-CM | POA: Diagnosis not present

## 2023-01-04 DIAGNOSIS — S066X0A Traumatic subarachnoid hemorrhage without loss of consciousness, initial encounter: Secondary | ICD-10-CM | POA: Diagnosis not present

## 2023-01-04 DIAGNOSIS — R29810 Facial weakness: Secondary | ICD-10-CM | POA: Diagnosis not present

## 2023-01-04 DIAGNOSIS — S065XAS Traumatic subdural hemorrhage with loss of consciousness status unknown, sequela: Secondary | ICD-10-CM | POA: Diagnosis not present

## 2023-01-04 DIAGNOSIS — W1789XD Other fall from one level to another, subsequent encounter: Secondary | ICD-10-CM | POA: Diagnosis not present

## 2023-01-04 DIAGNOSIS — H9191 Unspecified hearing loss, right ear: Secondary | ICD-10-CM | POA: Diagnosis not present

## 2023-01-04 DIAGNOSIS — F10129 Alcohol abuse with intoxication, unspecified: Secondary | ICD-10-CM | POA: Diagnosis not present

## 2023-01-04 DIAGNOSIS — R519 Headache, unspecified: Secondary | ICD-10-CM | POA: Diagnosis not present

## 2023-01-04 DIAGNOSIS — S066X0D Traumatic subarachnoid hemorrhage without loss of consciousness, subsequent encounter: Secondary | ICD-10-CM | POA: Diagnosis not present

## 2023-01-04 DIAGNOSIS — R41844 Frontal lobe and executive function deficit: Secondary | ICD-10-CM | POA: Diagnosis not present

## 2023-01-04 DIAGNOSIS — M79604 Pain in right leg: Secondary | ICD-10-CM | POA: Diagnosis not present

## 2023-01-04 DIAGNOSIS — K5901 Slow transit constipation: Secondary | ICD-10-CM | POA: Diagnosis not present

## 2023-01-04 DIAGNOSIS — S066XAD Traumatic subarachnoid hemorrhage with loss of consciousness status unknown, subsequent encounter: Principal | ICD-10-CM

## 2023-01-04 DIAGNOSIS — S069X9S Unspecified intracranial injury with loss of consciousness of unspecified duration, sequela: Secondary | ICD-10-CM | POA: Diagnosis not present

## 2023-01-04 LAB — CBC
HCT: 36.5 % — ABNORMAL LOW (ref 39.0–52.0)
Hemoglobin: 12.6 g/dL — ABNORMAL LOW (ref 13.0–17.0)
MCH: 33.1 pg (ref 26.0–34.0)
MCHC: 34.5 g/dL (ref 30.0–36.0)
MCV: 95.8 fL (ref 80.0–100.0)
Platelets: 660 10*3/uL — ABNORMAL HIGH (ref 150–400)
RBC: 3.81 MIL/uL — ABNORMAL LOW (ref 4.22–5.81)
RDW: 12.4 % (ref 11.5–15.5)
WBC: 8.1 10*3/uL (ref 4.0–10.5)
nRBC: 0 % (ref 0.0–0.2)

## 2023-01-04 LAB — CREATININE, SERUM
Creatinine, Ser: 0.76 mg/dL (ref 0.61–1.24)
GFR, Estimated: >60 mL/min (ref >60)

## 2023-01-04 MED ORDER — CLONAZEPAM 0.25 MG PO TBDP
0.2500 mg | ORAL_TABLET | Freq: Two times a day (BID) | ORAL | Status: DC
  Administered 2023-01-04 – 2023-01-07 (×7): 0.25 mg via ORAL
  Filled 2023-01-04 (×7): qty 1

## 2023-01-04 MED ORDER — LORAZEPAM 0.5 MG PO TABS
1.0000 mg | ORAL_TABLET | Freq: Four times a day (QID) | ORAL | Status: DC | PRN
  Administered 2023-01-05 – 2023-01-13 (×12): 1 mg via ORAL
  Filled 2023-01-04 (×12): qty 2

## 2023-01-04 MED ORDER — ENOXAPARIN SODIUM 40 MG/0.4ML IJ SOSY: 40.0000 mg | PREFILLED_SYRINGE | INTRAMUSCULAR | Status: DC

## 2023-01-04 MED ORDER — QUETIAPINE FUMARATE 50 MG PO TABS
100.0000 mg | ORAL_TABLET | Freq: Every morning | ORAL | Status: DC
  Administered 2023-01-05 – 2023-01-09 (×5): 100 mg via ORAL
  Filled 2023-01-04 (×5): qty 2

## 2023-01-04 MED ORDER — METHYLPHENIDATE HCL 5 MG PO TABS
10.0000 mg | ORAL_TABLET | Freq: Two times a day (BID) | ORAL | Status: DC
  Administered 2023-01-05 – 2023-01-07 (×6): 10 mg via ORAL
  Filled 2023-01-04 (×6): qty 2

## 2023-01-04 MED ORDER — PROPRANOLOL HCL 20 MG/5ML PO SOLN
20.0000 mg | Freq: Three times a day (TID) | ORAL | Status: DC
  Administered 2023-01-04 – 2023-01-15 (×33): 20 mg via ORAL
  Filled 2023-01-04 (×35): qty 5

## 2023-01-04 MED ORDER — ASPIRIN 81 MG PO CHEW
81.0000 mg | CHEWABLE_TABLET | Freq: Every day | ORAL | Status: DC
  Administered 2023-01-05 – 2023-01-23 (×19): 81 mg via ORAL
  Filled 2023-01-04 (×19): qty 1

## 2023-01-04 MED ORDER — ENOXAPARIN SODIUM 40 MG/0.4ML IJ SOSY
40.0000 mg | PREFILLED_SYRINGE | Freq: Two times a day (BID) | INTRAMUSCULAR | Status: DC
  Administered 2023-01-04 – 2023-01-22 (×35): 40 mg via SUBCUTANEOUS
  Filled 2023-01-04 (×36): qty 0.4

## 2023-01-04 MED ORDER — FOLIC ACID 1 MG PO TABS
1.0000 mg | ORAL_TABLET | Freq: Every day | ORAL | Status: DC
  Administered 2023-01-05 – 2023-01-23 (×19): 1 mg via ORAL
  Filled 2023-01-04 (×19): qty 1

## 2023-01-04 MED ORDER — ADULT MULTIVITAMIN W/MINERALS CH
1.0000 | ORAL_TABLET | Freq: Every day | ORAL | Status: DC
  Administered 2023-01-05 – 2023-01-23 (×19): 1 via ORAL
  Filled 2023-01-04 (×19): qty 1

## 2023-01-04 MED ORDER — QUETIAPINE FUMARATE 50 MG PO TABS
200.0000 mg | ORAL_TABLET | Freq: Every day | ORAL | Status: DC
  Administered 2023-01-04 – 2023-01-08 (×5): 200 mg via ORAL
  Filled 2023-01-04 (×5): qty 4

## 2023-01-04 MED ORDER — THIAMINE MONONITRATE 100 MG PO TABS
100.0000 mg | ORAL_TABLET | Freq: Every day | ORAL | Status: DC
  Administered 2023-01-05 – 2023-01-23 (×19): 100 mg via ORAL
  Filled 2023-01-04 (×19): qty 1

## 2023-01-04 MED ORDER — OXYCODONE HCL 5 MG PO TABS
5.0000 mg | ORAL_TABLET | ORAL | Status: DC | PRN
  Administered 2023-01-05 – 2023-01-10 (×5): 5 mg via ORAL
  Filled 2023-01-04 (×5): qty 1

## 2023-01-04 NOTE — H&P (Signed)
Physical Medicine and Rehabilitation Admission H&P        Chief Complaint  Patient presents with   Fall  : HPI: Shawn Mills is a 42 year old right-handed with unremarkable past medical history.  Per chart review patient lives with significant other.  1 level apartment.  Independent prior to admission.  Patient works [employer] representative.  Presented 12/07/2022 after falling backwards on his front porch striking the back of his head.  Admission chemistries unremarkable except sodium 132 glucose 115 AST 52 ALT 55, WBC 14,100, alcohol 270, lactic acid 2.1.  Cranial CT scan showed subdural and subarachnoid hemorrhage along the left frontal lobe and temporal lobe without significant mass effect or midline shift.  Nondisplaced right parietal fracture which extends inferiorly to the right lambdoid suture, with diastasis of the right lambdoid suture.  Fractures extending through the bilateral sphenoid sinuses and into the bilateral carotid canals.  Fracture also extends superiorly through the clivus into the posterior fossa and through the floor the sphenoid sinuses into the nasopharynx.  Vertically oriented fracture through the right mastoid which extends into the middle ear, adjacent to the ossicles with likely hemorrhage around the ossicles in the middle ear.  CT cervical spine negative.  CT of chest abdomen pelvis unremarkable.  Patient did require intubation for airway protection through 12/18/2022.  CT angiogram head and neck irregularity and linear filling defects in the left distal petrous segment and possibly in the bilateral cavernous segments, concerning for dissection.  No hemodynamically significant stenosis in the neck.  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  No evidence of cortical venous reflux and no occlusion shunting or aneurysms or dissections extracranially or intracranially.  Patient was cleared for  low-dose aspirin therapy.  Follow-up neurosurgery Dr. Franky Macho in regards to traumatic SAH conservative care placed on Keppra x 7 days.  ENT follow-up did not recommend operative intervention for facial fractures.  EEG negative for seizure.  Patient was placed on CIWA for alcohol intoxication.  Treated for suspected aspiration pneumonia completed course of antibiotic care.  His diet was advanced from mechanical soft nectar thick to a regular diet.  Lovenox added for DVT prophylaxis.  Patient with ongoing bouts of agitation and restlessness and currently remains on Inderal/Klonopin 0.25 mg twice daily as well as Seroquel.  Ritalin was added for activation.  An enclosure bed has been used for patient safety.  Therapy evaluations completed due to patient's TBI decreased functional mobility was admitted for a comprehensive rehab program.   Review of Systems  Constitutional:  Negative for chills and fever.  HENT:  Negative for hearing loss.   Eyes:  Negative for double vision.  Respiratory:  Negative for cough, shortness of breath and wheezing.   Cardiovascular:  Negative for chest pain and palpitations.  Gastrointestinal:  Positive for constipation. Negative for heartburn, nausea and vomiting.  Genitourinary:  Negative for dysuria, flank pain and hematuria.  Musculoskeletal:  Positive for joint pain and myalgias.  Skin:  Negative for rash.  Neurological:  Positive for headaches.  All other systems reviewed and are negative.   No past medical history on file. No family history on file. Social History:  has no history on file for tobacco use, alcohol use, and drug use. Allergies: No Known Allergies       Medications Prior to Admission  Medication Sig Dispense Refill   PRILOSEC OTC 20 MG tablet Take 20 mg by mouth 2 (two) times daily  as needed (for heartburn or reflux).       TUMS E-X 750 750 MG chewable tablet Chew 1-2 tablets by mouth 3 (three) times daily as needed for heartburn.               Home: Home Living Family/patient expects to be discharged to:: Private residence Living Arrangements: Spouse/significant other, Other (Comment) (fiance's daughter) Available Help at Discharge: Family, Available 24 hours/day Type of Home: Apartment Home Access: Level entry Home Layout: One level Bathroom Shower/Tub: Engineer, manufacturing systems: Standard Bathroom Accessibility: Yes Home Equipment: Grab bars - tub/shower 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, enjoys working on cars, dog must walk on Psychologist, sport and exercise With: Significant other, Other (Comment) (fiance's daughter)   Functional History: Prior Function Prior Level of Function : Independent/Modified Independent, Driving, Working/employed   Functional Status:  Mobility: Bed Mobility Overal bed mobility: Needs Assistance Bed Mobility: Supine to Sit, Sit to Supine Rolling: Mod assist Sidelying to sit: +2 for physical assistance, Total assist Supine to sit: Mod assist Sit to supine: Supervision Sit to sidelying: Min guard General bed mobility comments: in bed with waist restraint; guarding for safety due to decr awareness with fall trying to get OOB last night Transfers Overall transfer level: Needs assistance Equipment used: None Transfers: Sit to/from Stand Sit to Stand: Min guard Bed to/from chair/wheelchair/BSC transfer type:: Step pivot Squat pivot transfers: Mod assist Step pivot transfers: Min assist, +2 safety/equipment General transfer comment: pt powers up and then needs increased (A) to mod with transfer Ambulation/Gait Ambulation/Gait assistance: Min assist Gait Distance (Feet): 35 Feet Assistive device: None (pt reaching to hold onto bed, counter, etc) Gait Pattern/deviations: Decreased stride length, Wide base of support, Step-through pattern, Drifts right/left General Gait Details: less steady this morning (?med effects); pt seeking UE support where he was not yesterday Gait velocity interpretation:  <1.8 ft/sec, indicate of risk for recurrent falls   ADL: ADL Overall ADL's : Needs assistance/impaired Eating/Feeding: Supervision/ safety Eating/Feeding Details (indicate cue type and reason): drinking from cup appropriately and pour soda into cup without spillage Grooming: Wash/dry hands, Oral care, Maximal assistance Grooming Details (indicate cue type and reason): pt needs mod cues to walk to sink with door closed to bathroom to direct patient to a sink with auditory sound of running water. OT placing soap on the posterior aspect of hand and pt initiates task in detail. pt asked to brush teeth. pt drops first tooth brush then terminates task Upper Body Dressing : Minimal assistance, Sitting Upper Body Dressing Details (indicate cue type and reason): pt needed increased time and therapist to untie the gown to help him succeed in doff of gown. pt becoming increasingly irritated by gown and calm iwth removal Lower Body Dressing: Maximal assistance Lower Body Dressing Details (indicate cue type and reason): pt reaches for socks and touches them already don Toilet Transfer: Moderate assistance Toilet Transfer Details (indicate cue type and reason): pt unsteady gait to bathroom and needs steady assist to static stand. pt states "i got this!" with increased voice volume to indicate no need for help. OT giving mod (A) to prevent fall and pt holding with R ue on rail. pt does not attempt to aim urine and is positioned very close to the toilet to avoid peeing on his feet Functional mobility during ADLs: Moderate assistance (heavy mod at times) General ADL Comments: pt needed auditory cues to initiate.pt after hearing commode flush and refusing out of bed states "i am going to go in here  now and use this toilet. i am not doing none of that". Pt  was asked to complete commode transfer demonstrating poor STM.   Cognition: Cognition Overall Cognitive Status: Impaired/Different from  baseline Arousal/Alertness: Lethargic Orientation Level: Oriented to person, Disoriented to place, Disoriented to time, Disoriented to situation Attention: Focused Focused Attention: Impaired Focused Attention Impairment: Verbal basic, Functional basic [place] Scales of Cognitive Functioning: Confused/Agitated Cognition Arousal/Alertness: Awake/alert Behavior During Therapy: Flat affect Overall Cognitive Status: Impaired/Different from baseline Area of Impairment: Orientation, Attention, Memory, Following commands, Safety/judgement, Awareness, Problem solving, Rancho level Orientation Level: Disoriented to, Place, Time, Situation Current Attention Level: Sustained Memory: Decreased recall of precautions, Decreased short-term memory Following Commands: Follows one step commands inconsistently, Follows one step commands with increased time Safety/Judgement: Decreased awareness of safety, Decreased awareness of deficits Awareness: Intellectual Problem Solving: Slow processing, Decreased initiation, Requires verbal cues, Requires tactile cues General Comments: Pt asleep on arrival and requires increased time to arouse. pt responding to greeting with middle finger (3rd digit extension). pt with word finding deficits. pt throughout the session verbalize "lets go home" pt verbalize to call girlfriend and unable to recall her name when asked. pt does recognize written "Efraim Kaufmann' and states "thats what the hell i said." Pt giving vague directions "go out here take a right then a left and then go" Pt asked go where " he states "[address]" -- the directions were correct to [address]. Pt easily irriated by brisk questioning this session compared to prior sessions. pt once sitting doff restraints and gown. pt nude except for his snoopy hat the entire session. At the end of session pt agreeable to reapply restraint and gown. Pt provided drink which he appropriate opened and poured in cup. pt provided  phone but does not utilize it despite being increasingly agitated over needing fiance called. Difficult to assess due to: Impaired communication   Physical Exam: Blood pressure 120/86, pulse 92, temperature 98.7 F (37.1 C), temperature source Oral, resp. rate 20, height 5\' 10"  (1.778 m), weight 92.1 kg, SpO2 97 %.  Constitutional: No apparent distress. Appropriate appearance for age.  Sitting upright in veil bed. HENT: No JVD. Neck Supple. Trachea midline. Atraumatic, normocephalic.  Resolving bruising. Eyes: PERRLA. EOMI, some mild nystagmus with right greater than left gaze. Visual fields grossly intact.  Cardiovascular: RRR, no murmurs/rub/gallops. No Edema. Peripheral pulses 2+  Respiratory: CTAB. No rales, rhonchi, or wheezing. On RA.  Abdomen: + bowel sounds, normoactive. No distention or tenderness.  Skin: C/D/I. No apparent lesions. + RUE IV, ttp  MSK:      No apparent deformity.      Strength:                RUE: 5/5 SA, 5/5 EF, 5/5 EE, 5/5 WE, 5/5 FF, 5/5 FA                 LUE: 5/5 SA, 5/5 EF, 5/5 EE, 5/5 WE, 5/5 FF, 5/5 FA                 RLE: 5/5 HF, 5/5 KE, 5/5 DF, 5/5 EHL, 5/5 PF                 LLE:  5/5 HF, 5/5 KE, 5/5 DF, 5/5 EHL, 5/5 PF   Neurologic exam:  Cognition: AAO to person, hospital, and June.  Not oriented to event. Can follow simple one-step commands Cannot spell world backwards.  Cannot do complex problem-solving. Operative on removal  of right upper extremity IV  Language: Fluent, multiple substitutions/neoglisms.  Can name 1 of 3 items correctly. no dysarthria.  Intact repetition. Memory: Recalls 1/3 objects at 5 minutes.   Insight: Poor insight into current condition.  Mood: Elevated/manic affect, appropriate mood.  Extremely Polite. Sensation: To light touch intact in BL UEs and LEs  Reflexes: 2+ in BL UE and LEs. Negative Hoffman's and babinski signs bilaterally.  CN: Right facial droop.  Mild right hearing deficit. Coordination: No apparent  tremors. No ataxia on FTN, HTS bilaterally.  Spasticity: MAS 0 in all extremities.         Lab Results Last 48 Hours        Results for orders placed or performed during the hospital encounter of 12/08/22 (from the past 48 hour(s))  Glucose, capillary     Status: Abnormal    Collection Time: 01/02/23  3:22 PM  Result Value Ref Range    Glucose-Capillary 120 (H) 70 - 99 mg/dL      Comment: Glucose reference range applies only to samples taken after fasting for at least 8 hours.      Imaging Results (Last 48 hours)  No results found.         Blood pressure 120/86, pulse 92, temperature 98.7 F (37.1 C), temperature source Oral, resp. rate 20, height 5\' 10"  (1.778 m), weight 92.1 kg, SpO2 97 %.   Medical Problem List and Plan: 1. 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  Antithrombotics: -DVT/anticoagulation:  Pharmaceutical: Lovenox             -antiplatelet therapy: Aspirin 81 mg daily 3. Pain Management: Oxycodone as needed 4. Mood/Behavior/Sleep: Open 0.25 mg twice daily, Ritalin 10 mg twice daily, Inderal 20 mg 3 times daily             -antipsychotic agents: Seroquel 100 mg every morning and 200 mg nightly   - Sleep log   - Delirium precautions, veil bed for impulsivity, DC IV on admission d/t limiting lines/drains  5. Neuropsych/cognition: This patient is not capable of making decisions on his own behalf.   - Rec neuropsych consult 6. Skin/Wound Care: Routine skin checks 7. Fluids/Electrolytes/Nutrition/Dysphagia: Routine in and outs with follow-up chemistries   - Cortrak Dced 6/26; maintain calorie count per nutrition  8.  Acute hypoxic respiratory failure.  Patient extubated 6/10. 9.  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  Maintained on low-dose aspirin 10.  Alcohol  intoxication.  Provide counseling.  Monitor for any signs of withdrawal. 11.  MRSA pneumonia.  Resolved.  Contact precautions.  Antibiotic therapy completed.   - Dfiscuss with ID with contact precautions can be lifted?     Mcarthur Rossetti Angiulli, PA-C 01/04/2023  I have examined the patient independently and edited the note for HPI, ROS, exam, assessment, and plan as appropriate. I am in agreement with the above recommendations.   Angelina Sheriff, DO 01/04/2023

## 2023-01-04 NOTE — Discharge Summary (Signed)
Physician Discharge Summary  Patient ID: Shawn Mills MRN: [id number] DOB/AGE: 12-09-1980 42 y.o.  Admit date: 12/08/2022 Discharge date: 01/04/2023  Admission Diagnoses Subarachnoid hemorrhage (HCC) [I60.9] SDH (subdural hematoma) (HCC) [S06.5XAA] TBI (traumatic brain injury) (HCC) [S06.9XAA] Respiratory depression [R06.89] Closed fracture of temporal bone, initial encounter (HCC) [S02.19XA] Alcoholic intoxication without complication (HCC) [F10.920] Seizure after head injury (HCC) [R56.1] Closed sphenoid sinus fracture, initial encounter (HCC) [S02.19XA] Closed fracture of mastoid bone, initial encounter (HCC) [S02.19XA] Closed fracture of parietal bone, initial encounter (HCC) [S02.0XXA]  Discharge Diagnoses Patient Active Problem List   Diagnosis Date Noted   TBI (traumatic brain injury) (HCC) 12/08/2022  SAH, SDH Tachycardia, HTN  Agitation/Sedation Acute hypoxic respiratory failure Right temporal bone/mastoid fracture, parietal skull fracture- Possible ICA dissection Fracture through sphenoid bones into carotid canals; vertical mastoid fracture  Alcohol intoxication Mild elevated transaminases  Suspected aspiration PNA Facial itching  Consultants Neurosurgery - Dr. Franky Macho Interventional Radiology - Dr. Corliss Skains ENT - Dr. Jearld Fenton Physical medicine and rehab  Procedures Dr. Corliss Skains 12/09/22 four-vessel cerebral arteriogram   EEG 6/6  HPI:  Shawn Mills is an 42 y.o. male who is here for evaluation as a level 1 trauma alert.  History is obtained entirely from EMS.  Patient was reportedly drinking on his back porch when he fell backwards and struck his head.  On arrival EMS noted the patient was bleeding from his right ear.  He was unresponsive.  He was breathing spontaneously.  He did not follow commands.  He had spontaneous movements more on his left side.  He was brought directly from the scene.  Otherwise no additional history is obtainable.   Hospital  Course:   Patient was admitted to the trauma service for further evaluation and treatment as below:  TBI with [facility], SDH -neurosurgery Dr. Franky Macho was consulted and his neurological exam was monitored.  He completed 7 days of Keppra for seizure prophylaxis.  Repeat CT on 6/1 was stable and repeat CT head/neck on 6/5 with ongoing intraparenchymal contusion/hemorrhage and resolution of ICA filling defect.  He had a negative EEG on 6/5.  On date of discharge he was intermittently following commands. Tachycardia, HTN -in setting of TBI.  He was placed on scheduled propranolol 6/12.  Tachycardia and hypertension improved Agitation/Sedation: Initially with more sedation but became more alert and agitated.  He was on scheduled Seroquel at time of discharge.  Physical medicine rehab was consulted during admission and he was on a Ritalin trial at time of discharge Acute hypoxic respiratory failure: On arrival he was not protecting his airway and was intubated in the trauma bay.  He was extubated 6/10 with stable respiratory function on date of discharge Right temporal bone/mastoid fracture, parietal skull fracture-neurosurgery, Dr. Franky Macho and ENT, Dr. Jearld Fenton were consulted and recommended audiogram once patient was extubated. No operative intervention was required Possible ICA dissection -interventional radiology was consulted and four-vessel cerebral arteriogram completed and negative except for occluded right internal jugular and right sigmoid sinus veins.  He was started on ASA 81 milligrams per Dr. Franky Macho. Fracture through sphenoid bones into carotid canals; vertical mastoid fracture - as above, Dr. Jearld Fenton ENT consulted and no surgical intervention Alcohol intoxication -CIWA protocol was in place during admission and [facility] team consulted.  He was on supportive supplements during admission Mild elevated transaminases -LFTs improved during admission Suspected aspiration PNA, resolved-respiratory culture 12/11/22  with H flu, and he completed ceftriaxone x5 days (completed 6/7) Facial itching - topical benadryl cream Nutrition -worked with SLP  during admission and was on a dysphagia 3/nectar diet.  He did have a core track placed for tube feeds for nutritional support.  This was removed prior to discharge  On date of discharge patient had appropriately progressed and met criteria for safe discharge to CIR with the support of mom and fianc.    Follow-up Information     [facility]. Call.   Why: As needed Contact information: [address] [place] [address] [phone]        Coletta Memos, MD. Schedule an appointment as soon as possible for a visit.   Specialty: Neurosurgery Contact information: [address] [place] [address] [phone]         Suzanna Obey, MD. Schedule an appointment as soon as possible for a visit.   Specialty: Otolaryngology Why: to follow up facial fractures Contact information: [address] [place] [address] [phone]                 Signed: Trixie Deis , [facility] 01/04/2023, 2:48 PM Please see Amion for pager number during day hours 7:00am-4:30pm

## 2023-01-04 NOTE — Progress Notes (Signed)
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 male who was admitted on 12/08/2022 after falling backwards while drinking on his front porch.  He was unresponsive on arrival of EMS.  Head CT revealed subarachnoid hemorrhage/subdural hemorrhage and multiple fractures of the skull including a diastatic fracture of the right temporal bone fracture, parietal skull fracture, clival fracture, sphenoid fractures, mastoid fracture on the right, right frontal pneumocephalus.  Repeat CT the following morning suggested A right carotid dissection.  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  There is no evidence of cortical venous reflux and no occlusions shunting or aneurysms or dissections extracranially or intracranially.  81 mg aspirin recommended by neurosurgery and patient placed on Keppra for 7 days for seizure prophylaxis.  Repeat CTA was negative.  EEG was negative.  Neurosurgery and ENT did not recommend operative intervention.  Patient was placed on CIWA for alcohol intoxication.  He was treated for suspected aspiration pneumonia which has since resolved.  His Foley was discontinued on June 11.  He has been upgraded to a D3 nectar liquid diet.  He has had continued issues with agitation and sedation.  He is in bilateral soft wrist restraints.  For behavior he is on Klonopin 0.5 mg 2 times daily as needed, Ativan 0.5 mg every 6 hours as needed.  Additionally he is on propranolol 20 mg 3 times daily and Seroquel 100 mg twice daily.  He is receiving oxycodone for pain 5  mg every 4 hours as needed in addition to fentanyl injection as needed for severe pain.   Review of Systems  Unable to perform ROS: Mental acuity    No past medical history on file.  The histories are not reviewed yet. Please review them in the "History" navigator section and refresh this SmartLink. No family history on file. Social History:  has no history on file for tobacco use, alcohol use, and drug use. Allergies: No Known Allergies       Medications Prior to Admission  Medication Sig Dispense Refill   PRILOSEC OTC 20 MG tablet Take 20 mg by mouth 2 (two) times daily as needed (for heartburn or reflux).       TUMS E-X 750 750 MG chewable tablet Chew 1-2 tablets by mouth 3 (three) times daily as needed for heartburn.          Home: Home Living Family/patient expects to be discharged to:: Private residence Living Arrangements: Spouse/significant other, Other (Comment) (fiance's daughter) Available Help at Discharge: Family, Available 24 hours/day Type of Home: Apartment Home Access: Level entry Home Layout: One level Bathroom Shower/Tub: Engineer, manufacturing systems: Standard Bathroom Accessibility: Yes Home Equipment: Grab bars - tub/shower 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, enjoys working on cars, dog must walk on Psychologist, sport and exercise With: Significant other, Other (Comment) (fiance's daughter)  Functional History: Prior Function Prior Level of Function : Independent/Modified Independent, Driving, Working/employed Functional Status:  Mobility: Bed Mobility Overal bed mobility: Needs Assistance Bed Mobility: Rolling, Sidelying to Sit, Sit to Supine  Rolling: Supervision Sidelying to sit: Max assist, +2 for physical assistance Supine to sit: HOB elevated, Max assist, +2 for physical assistance Sit to supine: Mod assist, +2 for safety/equipment Sit to sidelying: Max assist, +2 for physical assistance General bed mobility comments: total A +2 Transfers Overall transfer  level: Needs assistance Equipment used: Rolling walker (2 wheels) Transfers: Sit to/from Stand Sit to Stand: Max assist, +2 physical assistance General transfer comment: assist for initiating placing hands on RW and for anterior lean, assisting to lift up with pt using LE's to lift; cues for forward gaze out window and assist for balance; stood x 2 Ambulation/Gait Ambulation/Gait assistance: Mod assist, +2 physical assistance Gait Distance (Feet): 1 Feet (x 2) Assistive device: Rolling walker (2 wheels) Gait Pattern/deviations: Step-to pattern, Trunk flexed General Gait Details: side steps to HOB x 2 trials with RW and mod A of 2   ADL: ADL Overall ADL's : Needs assistance/impaired Eating/Feeding: NPO Eating/Feeding Details (indicate cue type and reason): required ice chip placed in mouth, does not open mouth or take it off the spoon with SLp Grooming: Maximal assistance Grooming Details (indicate cue type and reason): wiped mouth using L hand; hand over hand for brushing teeth, automatically spit in trough Functional mobility during ADLs: Maximal assistance, +2 for physical assistance General ADL Comments: total (A) for all adls   Cognition: Cognition Overall Cognitive Status: Impaired/Different from baseline Arousal/Alertness: Lethargic Orientation Level: Oriented to person, Disoriented to place, Disoriented to time, Disoriented to situation Attention: Focused Focused Attention: Impaired Focused Attention Impairment: Verbal basic, Functional basic Ranch[place]ant Scales of Cognitive Functioning: Confused/Agitated Cognition Arousal/Alertness: Lethargic, Suspect due to medications Behavior During Therapy: Restless, Impulsive Overall Cognitive Status: Impaired/Different from baseline Area of Impairment: Orientation, Attention, Memory, Following commands, Safety/judgement, Awareness, Problem solving, Rancho level Current Attention Level: Focused Following Commands: Follows one  step commands inconsistently Safety/Judgement: Decreased awareness of safety, Decreased awareness of deficits Awareness: Intellectual General Comments: communicating but many times not in response to questions until full attention gained then still somwhat confabulatory.  Patient restless in bed with PT entered scooting down despite wrist restriants and reaching for rectal tube Difficult to assess due to: Level of arousal   Blood pressure 118/76, pulse 80, temperature 98.6 F (37 C), resp. rate 19, height 5\' 10"  (1.778 m), weight 96.9 kg, SpO2 100 %. Physical Exam Constitutional:      Comments: Pt lethargic. Awakens to his name and tactile stim. Bilateral wrist restraints  HENT:     Head: Normocephalic.     Nose:     Comments: NGT    Mouth/Throat:     Mouth: Mucous membranes are moist.  Cardiovascular:     Rate and Rhythm: Normal rate.  Pulmonary:     Effort: Pulmonary effort is normal.  Abdominal:     Palpations: Abdomen is soft.  Musculoskeletal:        General: No swelling or tenderness.     Cervical back: Neck supple.  Skin:    General: Skin is warm and dry.  Neurological:     Comments: Patient is lethargic but does arouse to tactile and verbal  stim.  Made brief eye contact but generally restless and distracted.  Did utter a few unintelligible words and groans in response to questions. ?aphasia?  Did seem to grasp my hand with his left hand and move his left foot when I asked him to do so.  Did not spontaneously move his right arm or leg however.  Does  seem to sense pinch in all 4.  No apparent resting tone appreciated.  Psychiatric:     Comments: Generally lethargic but when awake he is restless and distracted        Lab Results Last 24 Hours       Results for orders placed or performed during the hospital encounter of 12/08/22 (from the past 24 hour(s))  Vancomycin, trough     Status: Abnormal    Collection Time: 12/25/22  2:41 PM  Result Value Ref Range    Vancomycin  Tr 8 (L) 15 - 20 ug/mL  Vancomycin, trough     Status: Abnormal    Collection Time: 12/25/22  2:56 PM  Result Value Ref Range    Vancomycin Tr 8 (L) 15 - 20 ug/mL  Glucose, capillary     Status: Abnormal    Collection Time: 12/25/22  5:07 PM  Result Value Ref Range    Glucose-Capillary 110 (H) 70 - 99 mg/dL  Glucose, capillary     Status: Abnormal    Collection Time: 12/25/22  7:53 PM  Result Value Ref Range    Glucose-Capillary 121 (H) 70 - 99 mg/dL  Glucose, capillary     Status: None    Collection Time: 12/26/22  3:24 AM  Result Value Ref Range    Glucose-Capillary 79 70 - 99 mg/dL  Glucose, capillary     Status: Abnormal    Collection Time: 12/26/22  7:44 AM  Result Value Ref Range    Glucose-Capillary 129 (H) 70 - 99 mg/dL  Glucose, capillary     Status: Abnormal    Collection Time: 12/26/22 11:50 AM  Result Value Ref Range    Glucose-Capillary 104 (H) 70 - 99 mg/dL      Imaging Results (Last 48 hours)  No results found.     Assessment/Plan: Diagnosis: 42 year old male status post traumatic left frontal-temporal subarachnoid and subdural hemorrhages with numerous skull fractures on 12/08/2022 after a fall.  Accident was alcohol related. RLAS IV Does the need for close, 24 hr/day medical supervision in concert with the patient's rehab needs make it unreasonable for this patient to be served in a less intensive setting? Potentially Co-Morbidities requiring supervision/potential complications:  -Intermittent sedation and agitated/restless behavior -Likely aspiration pneumonia -Dysphagia Due to bladder management, bowel management, safety, skin/wound care, disease management, medication administration, pain management, and patient education, does the patient require 24 hr/day rehab nursing? Yes Does the patient require coordinated care of a physician, rehab nurse, therapy disciplines of PT, OT, SLP to address physical and functional deficits in the context of the above medical  diagnosis(es)? Yes Addressing deficits in the following areas: balance, endurance, locomotion, strength, transferring, bowel/bladder control, bathing, dressing, feeding, grooming, toileting, cognition, speech, swallowing, and psychosocial support Can the patient actively participate in an intensive therapy program of at least 3 hrs of therapy per day at least 5 days per week? Yes and Potentially The potential for patient to make measurable gains while on inpatient rehab is good Anticipated functional outcomes upon discharge from inpatient rehab are supervision and min assist  with PT, supervision and min assist with OT, supervision and min assist with SLP. Estimated rehab length of stay to reach the above functional goals is: 20-30 days Anticipated discharge destination: Home Overall Rehab/Functional Prognosis: good   POST ACUTE RECOMMENDATIONS: This patient's condition is appropriate for continued rehabilitative care in the following setting: CIR Patient has agreed to participate in recommended program. N/A Note that insurance prior authorization may be  required for reimbursement for recommended care.   Comment: Rehab Admissions Coordinator to follow up re: potential rehab admission.     MEDICAL RECOMMENDATIONS: Patient with persistent lethargy             -Limit neuro sedating medication when possible especially fentanyl and ativan which he has been receiving fairly regularly             -Normalize sleep-wake architecture               -Trial of Ritalin 5 mg twice daily @ 0700 and 1200 2. Agitation             -He is already on scheduled Seroquel and propranolol in addition to the as needed benzodiazepines.  -Seroquel was just increased today. We can see how he does on 100mg  bid but I usually favor dosing higher in the PM  and lower in AM to help with sleep cycle, too.  - His BP and HR probably can tolerate further propranolol if needed.              -First, however, I think we can  improve his agitation by improving his arousal and awareness of the environment around him. See ritalin above             -he also may have aphasia as well based on my exam and location of his primary brain injury. This will also increase his sense of confusion and agitation.     I have personally performed a face to face diagnostic evaluation of this patient. Additionally, I have examined the patient's medical record including any pertinent labs and radiographic images. If the physician assistant has documented in this note, I have reviewed and edited or otherwise concur with the physician assistant's documentation.   Thanks,   Ranelle Oyster, MD 12/26/2022

## 2023-01-04 NOTE — TOC Transition Note (Signed)
Transition of Care [facility]) - CM/SW Discharge Note   Patient Details  Name: [name] MRN: [id number] Date of Birth: 1981/03/25  [facility] CM/SW Contact:  Glennon Mac, RN Phone Number: 01/04/2023, 3:35 PM   Clinical Narrative:    Patient medically stable for discharge today, and insurance approval has been received for admission to [facility].  Patient to transfer to rehab unit today.  Plan dc to CIR when bed is ready and available.    Final next level of care: IP Rehab Facility Barriers to Discharge: Barriers Resolved                         Discharge Plan and Services Additional resources added to the After Visit Summary for     Discharge Planning Services: CM Consult Post Acute Care Choice: IP Rehab                               Social Determinants of Health (SDOH) Interventions     Readmission Risk Interventions     No data to display         Quintella Baton, RN, BSN  Trauma/Neuro ICU Case Manager [phone]

## 2023-01-04 NOTE — Progress Notes (Signed)
Stephania Fragmin, PT Rehab Admission Coordinator Physical Medicine and Rehabilitation   Progress Notes     Signed   Date of Service: 01/04/2023  4:42 PM   Signed      PMR Admission Coordinator Pre-Admission Assessment   Patient: Shawn Mills is an 42 y.o., male MRN: [id number] DOB: Dec 05, 1980 Height: 5\' 10"  (177.8 cm) Weight: 92.1 kg   Insurance Information HMO: yes    PPO:      PCP:      IPA:      80/20:      OTHER:  PRIMARY: [employer] QHP      Policy#: [id number]      Subscriber: patient CM Name: auth via portal      Phone#: n/a     Fax#: [phone] Pre-Cert#: [id number] via portal.  Awaiting concurrent review info      Employer:  Benefits:  Phone #: [phone]     Name:  Eff. Date: 12/09/22-07/10/23     Deduct: does not have one      Out of Pocket Max: $9,400 ($0 met)      Life Max: NA CIR: $2,500 co-pay for days 1-3      SNF: $2,500 co-pay for days 1-3 Outpatient: $80 co-pay/visit     Co-Pay:  Home Health: $80 co-pay/visit      Co-Pay:  DME: 50% coverage     Co-Pay: 50% co-insurance Providers: in-network SECONDARY: [place] Medicaid [employer]      Policy#: [id number]     Phone#: [phone]  Received faxed approval for admit 01/01/23 for admission 01/01/23-01/08/23   Financial Counselor:       Phone#:    The "Data Collection Information Summary" for patients in Inpatient Rehabilitation Facilities with attached "Privacy Act Statement-Health Care Records" was provided and verbally reviewed with: N/A   Emergency Contact Information Contact Information       Name Relation Home Work Mobile    Williams Father     [phone]    KEYVIN, RISON Mother     [phone]    McCormick,Melissa Significant other     [phone]           Current Medical History  Patient Admitting Diagnosis: TBI History of Present Illness: Pt is a 42 year old male with medical hx significant for: kidney stones Pt presented to [facility] on 12/08/22 after fall in which he  struck his head. 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 Pt intubated 5/31-6/10. Neurosurgery consulted. Did not recommend operative intervention. 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 IR consulted. Pt underwent four-vessel cerebral arteriogram on 12/09/22 by Dr. Corliss Skains. Arteriogram negative except for occluded right internal jugular and right sigmoid sinus veins. ENT consulted and recommended no intervention at that time. EEG performed 12/14/22. EEG did not show seizures. Respiratory cultures with gram negative rods and gram positive coccus. Pt started on empiric cefepime for HCAP. Therapy evaluations completed and CIR recommended d/t pt's deficits in functional mobility, inability to complete ADLs, and cognitive-linguistic deficits.   Patient's medical record from [facility] has been reviewed by the rehabilitation admission coordinator and physician.   Past Medical History  No past medical history on file.   Has the patient had major surgery during 100 days prior to admission? No   Family History   family history  is not on file.   Current Medications   Current Facility-Administered Medications:    acetaminophen (TYLENOL) tablet 1,000 mg, 1,000 mg, Oral, Q6H, Violeta Gelinas, MD, 1,000 mg at 01/04/23 1221   aspirin chewable tablet 81 mg, 81 mg, Oral, Daily, Violeta Gelinas, MD, 81 mg at 01/04/23 1050   clonazepam (KLONOPIN) disintegrating tablet 0.25 mg, 0.25 mg, Oral, BID, Juliet Rude, PA-C, 0.25 mg at 01/04/23 1050   diphenhydrAMINE-zinc acetate (BENADRYL) 2-0.1 % cream, , Topical, BID PRN, Juliet Rude, PA-C, Given at 01/01/23 1757   enoxaparin (LOVENOX) injection 40 mg, 40 mg, Subcutaneous, Q12H, Lovick, Lennie Odor, MD, 40 mg at 01/04/23 1051   folic acid (FOLVITE) tablet  1 mg, 1 mg, Oral, Daily, Violeta Gelinas, MD, 1 mg at 01/04/23 1051   hydrALAZINE (APRESOLINE) injection 10 mg, 10 mg, Intravenous, Q2H PRN, Gaynelle Adu, MD   LORazepam (ATIVAN) injection 0.5-1 mg, 0.5-1 mg, Intravenous, Q8H PRN, Simaan, Elizabeth S, PA-C, 1 mg at 01/04/23 0110   methylphenidate (RITALIN) tablet 10 mg, 10 mg, Oral, BID WC, Violeta Gelinas, MD, 10 mg at 01/04/23 1221   metoprolol tartrate (LOPRESSOR) injection 5 mg, 5 mg, Intravenous, Q6H PRN, Gaynelle Adu, MD   multivitamin with minerals tablet 1 tablet, 1 tablet, Oral, Daily, Violeta Gelinas, MD, 1 tablet at 01/04/23 1050   ondansetron (ZOFRAN-ODT) disintegrating tablet 4 mg, 4 mg, Oral, Q6H PRN **OR** ondansetron (ZOFRAN) injection 4 mg, 4 mg, Intravenous, Q6H PRN, Gaynelle Adu, MD, 4 mg at 12/28/22 2155   Oral care mouth rinse, 15 mL, Mouth Rinse, PRN, Gaynelle Adu, MD   oxyCODONE (Oxy IR/ROXICODONE) immediate release tablet 5 mg, 5 mg, Oral, Q4H PRN, Violeta Gelinas, MD, 5 mg at 01/03/23 1249   propranolol (INDERAL) 20 MG/5ML solution 20 mg, 20 mg, Oral, TID, Violeta Gelinas, MD, 20 mg at 01/04/23 1051   QUEtiapine (SEROQUEL) tablet 100 mg, 100 mg, Oral, q morning, Lovick, Lennie Odor, MD, 100 mg at 01/04/23 1050   QUEtiapine (SEROQUEL) tablet 200 mg, 200 mg, Oral, QHS, Violeta Gelinas, MD, 200 mg at 01/03/23 2200   thiamine (VITAMIN B1) tablet 100 mg, 100 mg, Oral, Daily, 100 mg at 01/04/23 1051 **OR** [DISCONTINUED] thiamine (VITAMIN B1) injection 100 mg, 100 mg, Intravenous, Daily, Gaynelle Adu, MD   Patients Current Diet:  Diet Order                  Diet regular Room service appropriate? Yes; Fluid consistency: Thin  Diet effective now                         Precautions / Restrictions Precautions Precautions: Fall Precaution Comments: wasit posey bed restraint Restrictions Weight Bearing Restrictions: No    Has the patient had 2 or more falls or a fall with injury in the past year? Yes   Prior Activity  Level Community (5-7x/wk): works, drives, gets out of house daily   Prior Functional Level Self Care: Did the patient need help bathing, dressing, using the toilet or eating? Independent   Indoor Mobility: Did the patient need assistance with walking from room to room (with or without device)? Independent   Stairs: Did the patient need assistance with internal or external stairs (with or without device)? Independent   Functional Cognition: Did the patient need help planning regular tasks such as shopping or remembering to take medications? Independent   Patient Information Are you of Hispanic, Latino/a,or Spanish origin?: A. No, not of Hispanic, Latino/a, or  Spanish origin, X. Patient unable to respond (proxy for all) What is your race?: A. White, X. Patient unable to respond Do you need or want an interpreter to communicate with a doctor or health care staff?: 9. Unable to respond   Patient's Response To:  Health Literacy and Transportation Is the patient able to respond to health literacy and transportation needs?: No   Home Assistive Devices / Equipment Home Equipment: Grab bars - tub/shower   Prior Device Use: Indicate devices/aids used by the patient prior to current illness, exacerbation or injury? None of the above   Current Functional Level Cognition   Arousal/Alertness: Lethargic Overall Cognitive Status: Impaired/Different from baseline Difficult to assess due to: Impaired communication Current Attention Level: Sustained Orientation Level: Oriented to person, Disoriented to place, Disoriented to time, Disoriented to situation Following Commands: Follows one step commands inconsistently, Follows one step commands with increased time Safety/Judgement: Decreased awareness of safety, Decreased awareness of deficits General Comments: Pt asleep on arrival and requires increased time to arouse. pt responding to greeting with middle finger (3rd digit extension). pt with word  finding deficits. pt throughout the session verbalize "lets go home" pt verbalize to call girlfriend and unable to recall her name when asked. pt does recognize written "Efraim Kaufmann' and states "thats what the hell i said." Pt giving vague directions "go out here take a right then a left and then go" Pt asked go where " he states "[address]" -- the directions were correct to [address]. Pt easily irriated by brisk questioning this session compared to prior sessions. pt once sitting doff restraints and gown. pt nude except for his snoopy hat the entire session. At the end of session pt agreeable to reapply restraint and gown. Pt provided drink which he appropriate opened and poured in cup. pt provided phone but does not utilize it despite being increasingly agitated over needing fiance called. Attention: Focused Focused Attention: Impaired Focused Attention Impairment: Verbal basic, Functional basic [place] Scales of Cognitive Functioning: Confused/Agitated    Extremity Assessment (includes Sensation/Coordination)   Upper Extremity Assessment: Generalized weakness RUE Deficits / Details: Demonstrates functional strength and use during session RUE Coordination: decreased fine motor, decreased gross motor LUE Deficits / Details: Demonstrates functional strength and use during session LUE Coordination: decreased fine motor, decreased gross motor  Lower Extremity Assessment: Generalized weakness RLE Deficits / Details: no active movement noted and pt not following commands to move LLE Deficits / Details: moves spontaneously, but not to command     ADLs   Overall ADL's : Needs assistance/impaired Eating/Feeding: Supervision/ safety Eating/Feeding Details (indicate cue type and reason): drinking from cup appropriately and pour soda into cup without spillage Grooming: Wash/dry hands, Oral care, Maximal assistance Grooming Details (indicate cue type and reason): pt needs mod cues to walk to sink with  door closed to bathroom to direct patient to a sink with auditory sound of running water. OT placing soap on the posterior aspect of hand and pt initiates task in detail. pt asked to brush teeth. pt drops first tooth brush then terminates task Upper Body Dressing : Minimal assistance, Sitting Upper Body Dressing Details (indicate cue type and reason): pt needed increased time and therapist to untie the gown to help him succeed in doff of gown. pt becoming increasingly irritated by gown and calm iwth removal Lower Body Dressing: Maximal assistance Lower Body Dressing Details (indicate cue type and reason): pt reaches for socks and touches them already don Toilet Transfer: Moderate  assistance Toilet Transfer Details (indicate cue type and reason): pt unsteady gait to bathroom and needs steady assist to static stand. pt states "i got this!" with increased voice volume to indicate no need for help. OT giving mod (A) to prevent fall and pt holding with R ue on rail. pt does not attempt to aim urine and is positioned very close to the toilet to avoid peeing on his feet Functional mobility during ADLs: Moderate assistance (heavy mod at times) General ADL Comments: pt needed auditory cues to initiate.pt after hearing commode flush and refusing out of bed states "i am going to go in here now and use this toilet. i am not doing none of that". Pt  was asked to complete commode transfer demonstrating poor STM.     Mobility   Overal bed mobility: Needs Assistance Bed Mobility: Supine to Sit, Sit to Supine Rolling: Mod assist Sidelying to sit: +2 for physical assistance, Total assist Supine to sit: Mod assist Sit to supine: Supervision Sit to sidelying: Min guard General bed mobility comments: in bed with waist restraint; guarding for safety due to decr awareness with fall trying to get OOB last night     Transfers   Overall transfer level: Needs assistance Equipment used: None Transfers: Sit to/from  Stand Sit to Stand: Min guard Bed to/from chair/wheelchair/BSC transfer type:: Step pivot Squat pivot transfers: Mod assist Step pivot transfers: Min assist, +2 safety/equipment General transfer comment: pt powers up and then needs increased (A) to mod with transfer     Ambulation / Gait / Stairs / Wheelchair Mobility   Ambulation/Gait Ambulation/Gait assistance: Editor, commissioning (Feet): 35 Feet Assistive device: None (pt reaching to hold onto bed, counter, etc) Gait Pattern/deviations: Decreased stride length, Wide base of support, Step-through pattern, Drifts right/left General Gait Details: less steady this morning (?med effects); pt seeking UE support where he was not yesterday Gait velocity interpretation: <1.8 ft/sec, indicate of risk for recurrent falls     Posture / Balance Dynamic Sitting Balance Sitting balance - Comments: intermittent use of UEs to maintain balance. imbalance posteriorly, especially when trying to scoot forward and when crossing his legs Balance Overall balance assessment: Needs assistance Sitting-balance support: Bilateral upper extremity supported, Feet supported Sitting balance-Leahy Scale: Poor Sitting balance - Comments: intermittent use of UEs to maintain balance. imbalance posteriorly, especially when trying to scoot forward and when crossing his legs Postural control: Right lateral lean Standing balance support: Bilateral upper extremity supported, During functional activity, Reliant on assistive device for balance Standing balance-Leahy Scale: Poor Standing balance comment: slight sway ant-post with feet in wide stance; with minguard can reach unilaterally overhead  and stand with eyes closed x 10 sec with slight sway     Special needs/care consideration Skin Ecchymosis: abdomen, arm,nare, thigh, leg/bilateral; External urinary catheter, NG tube, Bowel and Bladder incontinence    Previous Home Environment (from acute therapy  documentation) Living Arrangements: Spouse/significant other, Other (Comment) (fiance's daughter)  Lives With: Significant other, Other (Comment) (fiance's daughter) Available Help at Discharge: Family, Available 24 hours/day Type of Home: Apartment Home Layout: One level Home Access: Level entry Bathroom Shower/Tub: Engineer, manufacturing systems: Standard Bathroom Accessibility: Yes How Accessible: Accessible via walker Home Care Services: No 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, enjoys working on cars, dog must walk on Engineer, technical sales for Discharge Living Setting: Patient's home Type of Home at Discharge: Apartment Discharge Home Layout: One level Discharge Home Access: Level entry Discharge Bathroom Shower/Tub:  Tub/shower unit Discharge Bathroom Toilet: Standard Discharge Bathroom Accessibility: Yes How Accessible: Accessible via walker Does the patient have any problems obtaining your medications?: No   Social/Family/Support Systems Patient Roles: Partner Anticipated Caregiver: Efraim Kaufmann (fiance), her daughter, and other family Anticipated Caregiver's Contact Information: Efraim Kaufmann [phone] Ability/Limitations of Caregiver: none stated Caregiver Availability: 24/7 Discharge Plan Discussed with Primary Caregiver: Yes Is Caregiver In Agreement with Plan?: Yes Does Caregiver/Family have Issues with Lodging/Transportation while Pt is in Rehab?: No   Goals Patient/Family Goal for Rehab: PT/OT/SLP supervision to min assist Expected length of stay: 20-30 days Additional Information: Discharge plan: return to pt's home.  Steffanie Rainwater Efraim Kaufmann), mom (sybil) and other family members to provide 24/7 assist Pt/Family Agrees to Admission and willing to participate: Yes Program Orientation Provided & Reviewed with Pt/Caregiver Including Roles  & Responsibilities: Yes  Barriers to Discharge: Insurance for SNF coverage   Decrease burden of Care through IP rehab  admission: NA   Possible need for SNF placement upon discharge: Not anticipated   Patient Condition: I have reviewed medical records from [facility], spoken with CM, and patient and spouse. I met with patient at the bedside for inpatient rehabilitation assessment.  Patient will benefit from ongoing PT, OT, and SLP, can actively participate in 3 hours of therapy a day 5 days of the week, and can make measurable gains during the admission.  Patient will also benefit from the coordinated team approach during an Inpatient Acute Rehabilitation admission.  The patient will receive intensive therapy as well as Rehabilitation physician, nursing, social worker, and care management interventions.  Due to bladder management, bowel management, safety, skin/wound care, disease management, medication administration, pain management, and patient education the patient requires 24 hour a day rehabilitation nursing.  The patient is currently min assist with mobility and basic ADLs.  Discharge setting and therapy post discharge at home with home health is anticipated.  Patient has agreed to participate in the Acute Inpatient Rehabilitation Program and will admit today.   Preadmission Screen Completed By:  Estill Dooms, PT, DPT, 01/04/2023 1:50 PM ______________________________________________________________________   Discussed status with Dr. Shearon Stalls on 01/04/23  at 3:25 PM  and received approval for admission today.   Admission Coordinator:  Estill Dooms, PT, DPT time 3:25 PM Dorna Bloom 01/04/23     Assessment/Plan: Diagnosis: Does the need for close, 24 hr/day Medical supervision in concert with the patient's rehab needs make it unreasonable for this patient to be served in a less intensive setting? 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 intakes Due to safety, skin/wound care, disease management, medication administration, pain management, and patient education, does the patient require 24 hr/day rehab nursing? Yes Does the patient require coordinated care of a physician, rehab nurse, PT, OT, and SLP to address physical and functional deficits in the context of the above medical diagnosis(es)? Yes Addressing deficits in the following areas: balance, endurance, locomotion, strength, transferring, bowel/bladder control, bathing, dressing, feeding, grooming, toileting, cognition, speech, language, swallowing, and psychosocial support Can the patient actively participate in an intensive therapy program of at least 3 hrs of therapy 5 days a week? Yes The potential for patient to make measurable gains while on inpatient rehab is good Anticipated functional outcomes upon discharge from inpatient rehab: min assist PT, min assist OT, min assist SLP Estimated rehab length of stay to reach  the above functional goals is: 20-30 days Anticipated discharge destination: Home 10. Overall Rehab/Functional Prognosis: good     MD Signature:   Angelina Sheriff, DO 01/04/2023

## 2023-01-04 NOTE — Plan of Care (Signed)
  Problem: Education: Goal: Knowledge of General Education information will improve Description: Including pain rating scale, medication(s)/side effects and non-pharmacologic comfort measures Outcome: Adequate for Discharge   Problem: Health Behavior/Discharge Planning: Goal: Ability to manage health-related needs will improve Outcome: Adequate for Discharge   Problem: Clinical Measurements: Goal: Ability to maintain clinical measurements within normal limits will improve Outcome: Adequate for Discharge Goal: Will remain free from infection Outcome: Adequate for Discharge Goal: Diagnostic test results will improve Outcome: Adequate for Discharge   Problem: Activity: Goal: Risk for activity intolerance will decrease Outcome: Adequate for Discharge   Problem: Coping: Goal: Level of anxiety will decrease Outcome: Adequate for Discharge   Problem: Elimination: Goal: Will not experience complications related to bowel motility Outcome: Adequate for Discharge Goal: Will not experience complications related to urinary retention Outcome: Adequate for Discharge   Problem: Pain Managment: Goal: General experience of comfort will improve Outcome: Adequate for Discharge   Problem: Safety: Goal: Ability to remain free from injury will improve Outcome: Adequate for Discharge   Problem: Skin Integrity: Goal: Risk for impaired skin integrity will decrease Outcome: Adequate for Discharge   Problem: Safety: Goal: Non-violent Restraint(s) Outcome: Adequate for Discharge   Problem: Education: Goal: Understanding of CV disease, CV risk reduction, and recovery process will improve Outcome: Adequate for Discharge Goal: Individualized Educational Video(s) Outcome: Adequate for Discharge   Problem: Activity: Goal: Ability to return to baseline activity level will improve Outcome: Adequate for Discharge   Problem: Cardiovascular: Goal: Ability to achieve and maintain adequate  cardiovascular perfusion will improve Outcome: Adequate for Discharge Goal: Vascular access site(s) Level 0-1 will be maintained Outcome: Adequate for Discharge   Problem: Health Behavior/Discharge Planning: Goal: Ability to safely manage health-related needs after discharge will improve Outcome: Adequate for Discharge

## 2023-01-04 NOTE — Progress Notes (Addendum)
Inpatient Rehab Admissions Coordinator:   I spoke to pt's fiancee, Melissa, over the phone.  Family concerned about not knowing that pt was transferred to 3W.  I let them know that we had just received insurance approval for CIR and I will discuss with trauma service whether he's ready to admit today versus tomorrow pending calorie count.  Confirmed dispo, home to pt's house with mom, fiancee, and other family members providing 24/7 supervision-min assist.  Reviewed ELOS ~1 month.  Will f/u later this afternoon.   Addendum: 0347: I have insurance approval from [place] and trauma service in agreement with d/c to CIR today.  Will continue calorie count and determine need for cortrak vs not on rehab.  I let his fiancee know.    Estill Dooms, PT, DPT Admissions Coordinator [phone] 01/04/23  1:48 PM

## 2023-01-04 NOTE — Progress Notes (Signed)
Progress Note     Subjective: Alert, expressive aphasia. Less frustrated and more pleasant today.  Not eating well when his significant other is not there.    Objective: Vital signs in last 24 hours: Temp:  [97.6 F (36.4 C)-98.7 F (37.1 C)] 98.7 F (37.1 C) (06/27 0807) Pulse Rate:  [82-99] 92 (06/27 0807) Resp:  [16-20] 20 (06/27 0807) BP: (97-120)/(46-86) 120/86 (06/27 0807) SpO2:  [93 %-100 %] 97 % (06/27 0807) Last BM Date : 01/01/23  Intake/Output from previous day: 06/26 0701 - 06/27 0700 In: 120 [P.O.:120] Out: -  Intake/Output this shift: No intake/output data recorded.  PE: Gen: comfortable, no distress Neuro: sleeping on exam Neck: supple CV: RRR Pulm: unlabored breathing on RA Abd: soft, NT    GU: urine clear and yellow, +spontaneous voids Extr: wwp, no edema   Lab Results:  No results for input(s): "WBC", "HGB", "HCT", "PLT" in the last 72 hours. BMET No results for input(s): "NA", "K", "CL", "CO2", "GLUCOSE", "BUN", "CREATININE", "CALCIUM" in the last 72 hours. PT/INR No results for input(s): "LABPROT", "INR" in the last 72 hours. CMP     Component Value Date/Time   NA 133 (L) 12/27/2022 0355   K 3.9 12/27/2022 0355   CL 100 12/27/2022 0355   CO2 25 12/27/2022 0355   GLUCOSE 84 12/27/2022 0355   BUN 21 (H) 12/27/2022 0355   CREATININE 0.61 12/27/2022 0355   CALCIUM 8.9 12/27/2022 0355   PROT 7.0 12/25/2022 0812   ALBUMIN 2.6 (L) 12/25/2022 0812   AST 34 12/25/2022 0812   ALT 74 (H) 12/25/2022 0812   ALKPHOS 107 12/25/2022 0812   BILITOT 0.4 12/25/2022 0812   GFRNONAA >60 12/27/2022 0355   Lipase  No results found for: "LIPASE"     Studies/Results: No results found.  Anti-infectives: Anti-infectives (From admission, onward)    Start     Dose/Rate Route Frequency Ordered Stop   12/26/22 2200  vancomycin (VANCOREADY) IVPB 1500 mg/300 mL        1,500 mg 150 mL/hr over 120 Minutes Intravenous Every 8 hours 12/25/22 1653  12/31/22 1733   12/24/22 0200  vancomycin (VANCOREADY) IVPB 1250 mg/250 mL  Status:  Discontinued        1,250 mg 166.7 mL/hr over 90 Minutes Intravenous Every 12 hours 12/23/22 1342 12/25/22 1653   12/23/22 1430  vancomycin (VANCOREADY) IVPB 2000 mg/400 mL        2,000 mg 200 mL/hr over 120 Minutes Intravenous  Once 12/23/22 1342 12/23/22 1641   12/22/22 1100  ceFEPIme (MAXIPIME) 2 g in sodium chloride 0.9 % 100 mL IVPB        2 g 200 mL/hr over 30 Minutes Intravenous Every 8 hours 12/22/22 0949 12/29/22 1927   12/11/22 1115  cefTRIAXone (ROCEPHIN) 2 g in sodium chloride 0.9 % 100 mL IVPB        2 g 200 mL/hr over 30 Minutes Intravenous Every 24 hours 12/11/22 1020 12/15/22 1117        Assessment/Plan  fall   TBI with SAH, SDH - NSGY c/s, Dr. Franky Macho, monitor neuro exam, keppra x7d for sz ppx. Repeat CT head 6/1 stable. Repeat CTA head/neck 6/5. EEG negative 6/5. F/C at times now CV - added scheduled propranolol 6/12 with TBI, tachy, HTN - improved.  Agitation/Sedation:  more alert and agitated. 200 mg seroquel at bedtime and 100 mg seroquel Q AM. 0.25 mg klonopin BID, prn ativan Acute hypoxic respiratory failure:  extubated 6/10 R  temporal bone/mastoid fx, parietal skull fx - NSGY c/s, Dr. Franky Macho, ENT Jearld Fenton) recs audiogram once extubated. No operative intervention required. 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 ASA 81 per Dr. Franky Macho. Fx thru sphenoid bones into carotid canals; vertical mastoid fx Alcohol intoxication - CIWA, TOC c/s. Thiamine, folate, multi.  Mild elevated transaminases  Suspected aspiration PNA, resolved- resp cx 12/11/22  H flu, ceftriaxone x5d (completed 6/7) Facial itching - topical benadryl cream ID/Fever - Rocephin 6/3 - 6/7 for PNA. Vanc completed for MRSA PNA  Foley - d/c 6/11, good UOP FEN - DYS 3/nectar started 6/18, Cortrak out - calorie count today,  will likely need to replace cortrak tomorrow and start bolus TF while in enclosure bed if he cannot eat enough today. DVT - SCDs, LMWH Dispo - 4NP, therapies - CIR following. Ritalin trial per Dr. Riley Kill.  Stable for D/C to CIR    LOS: 27 days   I reviewed nursing notes, last 24 h vitals and pain scores, and last 48 h intake and output.   Adam Phenix, [facility] 01/04/2023, 8:42 AM Please see Amion for pager number during day hours 7:00am-4:30pm

## 2023-01-04 NOTE — Progress Notes (Signed)
Transported to 4W 20 in vail bed accompanied by tech, sitter, and fiance

## 2023-01-04 NOTE — Progress Notes (Signed)
INPATIENT REHABILITATION ADMISSION NOTE   Arrival Method: enclosure bed     Mental Orientation:alert   Assessment: done  Skin:done    IV'S:d/c'd   Pain: none  Tubes and Drains:none   Safety Measures:done   Vital Signs:done   Height and Weight:done   Rehab Orientation:with wife   Family:wife    Notes:

## 2023-01-04 NOTE — Progress Notes (Signed)
PMR Admission Coordinator Pre-Admission Assessment   Patient: Shawn Mills is an 42 y.o., male MRN: [id number] DOB: Jun 12, 1981 Height: 5\' 10"  (177.8 cm) Weight: 92.1 kg   Insurance Information HMO: yes    PPO:      PCP:      IPA:      80/20:      OTHER:  PRIMARY: [employer] QHP      Policy#: [id number]      Subscriber: patient CM Name: auth via portal      Phone#: n/a     Fax#: [phone] Pre-Cert#: [id number] via portal.  Awaiting concurrent review info      Employer:  Benefits:  Phone #: [phone]     Name:  Eff. Date: 12/09/22-07/10/23     Deduct: does not have one      Out of Pocket Max: $9,400 ($0 met)      Life Max: NA CIR: $2,500 co-pay for days 1-3      SNF: $2,500 co-pay for days 1-3 Outpatient: $80 co-pay/visit     Co-Pay:  Home Health: $80 co-pay/visit      Co-Pay:  DME: 50% coverage     Co-Pay: 50% co-insurance Providers: in-network SECONDARY: [place] [employer]      Policy#: [id number]     Phone#: [phone]  Received faxed approval for admit 01/01/23 for admission 01/01/23-01/08/23   Financial Counselor:       Phone#:    The "Data Collection Information Summary" for patients in Inpatient Rehabilitation Facilities with attached "Privacy Act Statement-Health Care Records" was provided and verbally reviewed with: N/A   Emergency Contact Information Contact Information       Name Relation Home Work Mobile    [place] Father     [phone]    MAXTEN, SHULER Mother     [phone]    McCormick,Melissa Significant other     [phone]           Current Medical History  Patient Admitting Diagnosis: TBI History of Present Illness: Pt is a 42 year old male with medical hx significant for: kidney stones Pt presented to [facility] on 12/08/22 after fall in which he struck his head. 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 Pt  intubated 5/31-6/10. Neurosurgery consulted. Did not recommend operative intervention. 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 IR consulted. Pt underwent four-vessel cerebral arteriogram on 12/09/22 by Dr. Corliss Skains. Arteriogram negative except for occluded right internal jugular and right sigmoid sinus veins. ENT consulted and recommended no intervention at that time. EEG performed 12/14/22. EEG did not show seizures. Respiratory cultures with gram negative rods and gram positive coccus. Pt started on empiric cefepime for HCAP. Therapy evaluations completed and CIR recommended d/t pt's deficits in functional mobility, inability to complete ADLs, and cognitive-linguistic deficits.   Patient's medical record from [facility] has been reviewed by the rehabilitation admission coordinator and physician.   Past Medical History  No past medical history on file.   Has the patient had major surgery during 100 days prior to admission? No   Family History   family history is not on file.   Current Medications   Current Facility-Administered Medications:    acetaminophen (TYLENOL) tablet 1,000 mg, 1,000 mg, Oral, Q6H, Violeta Gelinas, MD, 1,000 mg at 01/04/23 1221   aspirin chewable tablet 81  mg, 81 mg, Oral, Daily, Violeta Gelinas, MD, 81 mg at 01/04/23 1050   clonazepam (KLONOPIN) disintegrating tablet 0.25 mg, 0.25 mg, Oral, BID, Juliet Rude, PA-C, 0.25 mg at 01/04/23 1050   diphenhydrAMINE-zinc acetate (BENADRYL) 2-0.1 % cream, , Topical, BID PRN, Juliet Rude, PA-C, Given at 01/01/23 1757   enoxaparin (LOVENOX) injection 40 mg, 40 mg, Subcutaneous, Q12H, Lovick, Lennie Odor, MD, 40 mg at 01/04/23 1051   folic acid (FOLVITE) tablet 1 mg, 1 mg, Oral, Daily, Violeta Gelinas, MD, 1 mg at 01/04/23 1051   hydrALAZINE (APRESOLINE) injection 10 mg, 10 mg, Intravenous, Q2H PRN, Gaynelle Adu, MD   LORazepam (ATIVAN)  injection 0.5-1 mg, 0.5-1 mg, Intravenous, Q8H PRN, Simaan, Elizabeth S, PA-C, 1 mg at 01/04/23 0110   methylphenidate (RITALIN) tablet 10 mg, 10 mg, Oral, BID WC, Violeta Gelinas, MD, 10 mg at 01/04/23 1221   metoprolol tartrate (LOPRESSOR) injection 5 mg, 5 mg, Intravenous, Q6H PRN, Gaynelle Adu, MD   multivitamin with minerals tablet 1 tablet, 1 tablet, Oral, Daily, Violeta Gelinas, MD, 1 tablet at 01/04/23 1050   ondansetron (ZOFRAN-ODT) disintegrating tablet 4 mg, 4 mg, Oral, Q6H PRN **OR** ondansetron (ZOFRAN) injection 4 mg, 4 mg, Intravenous, Q6H PRN, Gaynelle Adu, MD, 4 mg at 12/28/22 2155   Oral care mouth rinse, 15 mL, Mouth Rinse, PRN, Gaynelle Adu, MD   oxyCODONE (Oxy IR/ROXICODONE) immediate release tablet 5 mg, 5 mg, Oral, Q4H PRN, Violeta Gelinas, MD, 5 mg at 01/03/23 1249   propranolol (INDERAL) 20 MG/5ML solution 20 mg, 20 mg, Oral, TID, Violeta Gelinas, MD, 20 mg at 01/04/23 1051   QUEtiapine (SEROQUEL) tablet 100 mg, 100 mg, Oral, q morning, Lovick, Lennie Odor, MD, 100 mg at 01/04/23 1050   QUEtiapine (SEROQUEL) tablet 200 mg, 200 mg, Oral, QHS, Violeta Gelinas, MD, 200 mg at 01/03/23 2200   thiamine (VITAMIN B1) tablet 100 mg, 100 mg, Oral, Daily, 100 mg at 01/04/23 1051 **OR** [DISCONTINUED] thiamine (VITAMIN B1) injection 100 mg, 100 mg, Intravenous, Daily, Gaynelle Adu, MD   Patients Current Diet:  Diet Order                  Diet regular Room service appropriate? Yes; Fluid consistency: Thin  Diet effective now                         Precautions / Restrictions Precautions Precautions: Fall Precaution Comments: wasit posey bed restraint Restrictions Weight Bearing Restrictions: No    Has the patient had 2 or more falls or a fall with injury in the past year? Yes   Prior Activity Level Community (5-7x/wk): works, drives, gets out of house daily   Prior Functional Level Self Care: Did the patient need help bathing, dressing, using the toilet or eating?  Independent   Indoor Mobility: Did the patient need assistance with walking from room to room (with or without device)? Independent   Stairs: Did the patient need assistance with internal or external stairs (with or without device)? Independent   Functional Cognition: Did the patient need help planning regular tasks such as shopping or remembering to take medications? Independent   Patient Information Are you of Hispanic, Latino/a,or Spanish origin?: A. No, not of Hispanic, Latino/a, or Spanish origin, X. Patient unable to respond (proxy for all) What is your race?: A. White, X. Patient unable to respond Do you need or want an interpreter to communicate with a doctor or health care staff?: 9. Unable  to respond   Patient's Response To:  Health Literacy and Transportation Is the patient able to respond to health literacy and transportation needs?: No   Home Assistive Devices / Equipment Home Equipment: Grab bars - tub/shower   Prior Device Use: Indicate devices/aids used by the patient prior to current illness, exacerbation or injury? None of the above   Current Functional Level Cognition   Arousal/Alertness: Lethargic Overall Cognitive Status: Impaired/Different from baseline Difficult to assess due to: Impaired communication Current Attention Level: Sustained Orientation Level: Oriented to person, Disoriented to place, Disoriented to time, Disoriented to situation Following Commands: Follows one step commands inconsistently, Follows one step commands with increased time Safety/Judgement: Decreased awareness of safety, Decreased awareness of deficits General Comments: Pt asleep on arrival and requires increased time to arouse. pt responding to greeting with middle finger (3rd digit extension). pt with word finding deficits. pt throughout the session verbalize "lets go home" pt verbalize to call girlfriend and unable to recall her name when asked. pt does recognize written "Efraim Kaufmann' and  states "thats what the hell i said." Pt giving vague directions "go out here take a right then a left and then go" Pt asked go where " he states "[address]" -- the directions were correct to [address]. Pt easily irriated by brisk questioning this session compared to prior sessions. pt once sitting doff restraints and gown. pt nude except for his snoopy hat the entire session. At the end of session pt agreeable to reapply restraint and gown. Pt provided drink which he appropriate opened and poured in cup. pt provided phone but does not utilize it despite being increasingly agitated over needing fiance called. Attention: Focused Focused Attention: Impaired Focused Attention Impairment: Verbal basic, Functional basic [place] Scales of Cognitive Functioning: Confused/Agitated    Extremity Assessment (includes Sensation/Coordination)   Upper Extremity Assessment: Generalized weakness RUE Deficits / Details: Demonstrates functional strength and use during session RUE Coordination: decreased fine motor, decreased gross motor LUE Deficits / Details: Demonstrates functional strength and use during session LUE Coordination: decreased fine motor, decreased gross motor  Lower Extremity Assessment: Generalized weakness RLE Deficits / Details: no active movement noted and pt not following commands to move LLE Deficits / Details: moves spontaneously, but not to command     ADLs   Overall ADL's : Needs assistance/impaired Eating/Feeding: Supervision/ safety Eating/Feeding Details (indicate cue type and reason): drinking from cup appropriately and pour soda into cup without spillage Grooming: Wash/dry hands, Oral care, Maximal assistance Grooming Details (indicate cue type and reason): pt needs mod cues to walk to sink with door closed to bathroom to direct patient to a sink with auditory sound of running water. OT placing soap on the posterior aspect of hand and pt initiates task in detail. pt asked to  brush teeth. pt drops first tooth brush then terminates task Upper Body Dressing : Minimal assistance, Sitting Upper Body Dressing Details (indicate cue type and reason): pt needed increased time and therapist to untie the gown to help him succeed in doff of gown. pt becoming increasingly irritated by gown and calm iwth removal Lower Body Dressing: Maximal assistance Lower Body Dressing Details (indicate cue type and reason): pt reaches for socks and touches them already don Toilet Transfer: Moderate assistance Toilet Transfer Details (indicate cue type and reason): pt unsteady gait to bathroom and needs steady assist to static stand. pt states "i got this!" with increased voice volume to indicate no need for help. OT giving mod (  A) to prevent fall and pt holding with R ue on rail. pt does not attempt to aim urine and is positioned very close to the toilet to avoid peeing on his feet Functional mobility during ADLs: Moderate assistance (heavy mod at times) General ADL Comments: pt needed auditory cues to initiate.pt after hearing commode flush and refusing out of bed states "i am going to go in here now and use this toilet. i am not doing none of that". Pt  was asked to complete commode transfer demonstrating poor STM.     Mobility   Overal bed mobility: Needs Assistance Bed Mobility: Supine to Sit, Sit to Supine Rolling: Mod assist Sidelying to sit: +2 for physical assistance, Total assist Supine to sit: Mod assist Sit to supine: Supervision Sit to sidelying: Min guard General bed mobility comments: in bed with waist restraint; guarding for safety due to decr awareness with fall trying to get OOB last night     Transfers   Overall transfer level: Needs assistance Equipment used: None Transfers: Sit to/from Stand Sit to Stand: Min guard Bed to/from chair/wheelchair/BSC transfer type:: Step pivot Squat pivot transfers: Mod assist Step pivot transfers: Min assist, +2  safety/equipment General transfer comment: pt powers up and then needs increased (A) to mod with transfer     Ambulation / Gait / Stairs / Wheelchair Mobility   Ambulation/Gait Ambulation/Gait assistance: Editor, commissioning (Feet): 35 Feet Assistive device: None (pt reaching to hold onto bed, counter, etc) Gait Pattern/deviations: Decreased stride length, Wide base of support, Step-through pattern, Drifts right/left General Gait Details: less steady this morning (?med effects); pt seeking UE support where he was not yesterday Gait velocity interpretation: <1.8 ft/sec, indicate of risk for recurrent falls     Posture / Balance Dynamic Sitting Balance Sitting balance - Comments: intermittent use of UEs to maintain balance. imbalance posteriorly, especially when trying to scoot forward and when crossing his legs Balance Overall balance assessment: Needs assistance Sitting-balance support: Bilateral upper extremity supported, Feet supported Sitting balance-Leahy Scale: Poor Sitting balance - Comments: intermittent use of UEs to maintain balance. imbalance posteriorly, especially when trying to scoot forward and when crossing his legs Postural control: Right lateral lean Standing balance support: Bilateral upper extremity supported, During functional activity, Reliant on assistive device for balance Standing balance-Leahy Scale: Poor Standing balance comment: slight sway ant-post with feet in wide stance; with minguard can reach unilaterally overhead  and stand with eyes closed x 10 sec with slight sway     Special needs/care consideration Skin Ecchymosis: abdomen, arm,nare, thigh, leg/bilateral; External urinary catheter, NG tube, Bowel and Bladder incontinence    Previous Home Environment (from acute therapy documentation) Living Arrangements: Spouse/significant other, Other (Comment) (fiance's daughter)  Lives With: Significant other, Other (Comment) (fiance's daughter) Available  Help at Discharge: Family, Available 24 hours/day Type of Home: Apartment Home Layout: One level Home Access: Level entry Bathroom Shower/Tub: Engineer, manufacturing systems: Standard Bathroom Accessibility: Yes How Accessible: Accessible via walker Home Care Services: No 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, enjoys working on cars, dog must walk on Engineer, technical sales for Discharge Living Setting: Patient's home Type of Home at Discharge: Apartment Discharge Home Layout: One level Discharge Home Access: Level entry Discharge Bathroom Shower/Tub: Tub/shower unit Discharge Bathroom Toilet: Standard Discharge Bathroom Accessibility: Yes How Accessible: Accessible via walker Does the patient have any problems obtaining your medications?: No   Social/Family/Support Systems Patient Roles: Partner Anticipated Caregiver: Melissa (fiance), her daughter, and  other family Anticipated Caregiver's Contact Information: Efraim Kaufmann [phone] Ability/Limitations of Caregiver: none stated Caregiver Availability: 24/7 Discharge Plan Discussed with Primary Caregiver: Yes Is Caregiver In Agreement with Plan?: Yes Does Caregiver/Family have Issues with Lodging/Transportation while Pt is in Rehab?: No   Goals Patient/Family Goal for Rehab: PT/OT/SLP supervision to min assist Expected length of stay: 20-30 days Additional Information: Discharge plan: return to pt's home.  Steffanie Rainwater Efraim Kaufmann), mom (sybil) and other family members to provide 24/7 assist Pt/Family Agrees to Admission and willing to participate: Yes Program Orientation Provided & Reviewed with Pt/Caregiver Including Roles  & Responsibilities: Yes  Barriers to Discharge: Insurance for SNF coverage   Decrease burden of Care through IP rehab admission: NA   Possible need for SNF placement upon discharge: Not anticipated   Patient Condition: I have reviewed medical records from [facility], spoken with CM,  and patient and spouse. I met with patient at the bedside for inpatient rehabilitation assessment.  Patient will benefit from ongoing PT, OT, and SLP, can actively participate in 3 hours of therapy a day 5 days of the week, and can make measurable gains during the admission.  Patient will also benefit from the coordinated team approach during an Inpatient Acute Rehabilitation admission.  The patient will receive intensive therapy as well as Rehabilitation physician, nursing, social worker, and care management interventions.  Due to bladder management, bowel management, safety, skin/wound care, disease management, medication administration, pain management, and patient education the patient requires 24 hour a day rehabilitation nursing.  The patient is currently min assist with mobility and basic ADLs.  Discharge setting and therapy post discharge at home with home health is anticipated.  Patient has agreed to participate in the Acute Inpatient Rehabilitation Program and will admit today.   Preadmission Screen Completed By:  Estill Dooms, PT, DPT, 01/04/2023 1:50 PM ______________________________________________________________________   Discussed status with Dr. Shearon Stalls on 01/04/23  at 3:25 PM  and received approval for admission today.   Admission Coordinator:  Estill Dooms, PT, DPT time 3:25 PM Dorna Bloom 01/04/23     Assessment/Plan: Diagnosis: Does the need for close, 24 hr/day Medical supervision in concert with the patient's rehab needs make it unreasonable for this patient to be served in a less intensive setting? Yes Co-Morbidities requiring supervision/potential complications: Intermittent tachycardia/hypertension, hyponatremia, thrombocytosis, agitation/confusion requiring antipsychotics, multiple facial fractures, alcohol intoxication on CIWA protocol, mild transaminitis, respiratory failure with questionable aspiration pneumonia, ?ICA dissection and dysphagia/poor p.o. intakes Due to safety,  skin/wound care, disease management, medication administration, pain management, and patient education, does the patient require 24 hr/day rehab nursing? Yes Does the patient require coordinated care of a physician, rehab nurse, PT, OT, and SLP to address physical and functional deficits in the context of the above medical diagnosis(es)? Yes Addressing deficits in the following areas: balance, endurance, locomotion, strength, transferring, bowel/bladder control, bathing, dressing, feeding, grooming, toileting, cognition, speech, language, swallowing, and psychosocial support Can the patient actively participate in an intensive therapy program of at least 3 hrs of therapy 5 days a week? Yes The potential for patient to make measurable gains while on inpatient rehab is good Anticipated functional outcomes upon discharge from inpatient rehab: min assist PT, min assist OT, min assist SLP Estimated rehab length of stay to reach the above functional goals is: 20-30 days Anticipated discharge destination: Home 10. Overall Rehab/Functional Prognosis: good     MD Signature:   Angelina Sheriff, DO 01/04/2023

## 2023-01-04 NOTE — Progress Notes (Signed)
PT Cancellation Note  Patient Details Name: Koltan Portocarrero MRN: [id number] DOB: [birth year]   Cancelled Treatment:    Reason Eval/Treat Not Completed: Fatigue/lethargy limiting ability to participate  Patient sleeping soundly and not arousing to name. Recently had meds causing sedation. Will reattempt later today as schedule permits.    Jerolyn Center, PT Acute Rehabilitation Services  Office [phone]  Zena Amos 01/04/2023, 12:28 PM

## 2023-01-04 NOTE — Plan of Care (Signed)
  Problem: RH SAFETY Goal: RH STG ADHERE TO SAFETY PRECAUTIONS W/ASSISTANCE/DEVICE Description: STG Adhere to Safety Precautions With cues Assistance/Device. Outcome: Not Progressing; enclosure bed

## 2023-01-04 NOTE — Progress Notes (Signed)
Brief Nutrition Note  Pt with continued poor PO intake and no enteral access after most recent cortrak was pulled out by pt 6/24. Attending team requests kcal count to determine if PO intake is adequate to meet needs without nutrition support.   Placed envelope on the door and discussed with RN and sitter. Will follow-up on pt's PO intake to determine nutrition adequacy.   See RD note from 6/26 for last full nutrition assessment.  Greig Castilla, RD, LDN Clinical Dietitian RD pager # available in [facility]  After hours/weekend pager # available in [facility]

## 2023-01-04 NOTE — Progress Notes (Signed)
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 Cooperative throughout session with lots of "yes, ma'am." Able to ambulate without a device x 150 ft with minguard assist. Able to find his room when started looking at room numbers ~4 doors down from his room. Able to state he is in the hospital! Balance remains an issue with standing dynamic tasks. Fiance present and thrilled with pt's progress.     Recommendations for follow up therapy are one component of a multi-disciplinary discharge planning process, led by the attending physician.  Recommendations may be updated based on patient status, additional functional criteria and insurance authorization.  Follow Up Recommendations       Assistance Recommended at Discharge Frequent or constant Supervision/Assistance  Patient can return home with the following Assistance with cooking/housework;Assist for transportation;Help with stairs or ramp for entrance;A little help with walking and/or transfers;Direct supervision/assist for medications management;Direct supervision/assist for financial management   Equipment Recommendations  Other (comment) (TBA)    Recommendations for Other Services       Precautions / Restrictions Precautions Precautions: Fall Precaution Comments: enclosure bed Restrictions Weight Bearing Restrictions: No     Mobility  Bed Mobility Overal bed mobility: Needs Assistance Bed Mobility: Supine to Sit, Sit to Supine     Supine to sit: Supervision Sit to supine: Supervision   General bed mobility comments: in enclosure bed; up/down without physical assist     Transfers Overall transfer level: Needs assistance Equipment used: None Transfers: Sit to/from Stand Sit to Stand: Min guard           General transfer comment: pt powers up and then with slight ant-post imbalance which he recovers    Ambulation/Gait Ambulation/Gait assistance: Min guard Gait Distance (Feet): 150 Feet Assistive device: None (pt reaching to hold onto bed, counter, etc) Gait Pattern/deviations: Decreased stride length, Wide base of support, Step-through pattern, Antalgic   Gait velocity interpretation: 1.31 - 2.62 ft/sec, indicative of limited community ambulator   General Gait Details: limping significantly and states his knees hurt; fiance present and reports he's had all kinds of LE problems and often does limp   Stairs             Wheelchair Mobility    Modified Rankin (Stroke Patients Only)       Balance Overall balance assessment: Needs assistance Sitting-balance support: Feet supported, No upper extremity supported Sitting balance-Leahy Scale: Good     Standing balance support: During functional activity, No upper extremity supported Standing balance-Leahy Scale: Fair Standing balance comment: able to reach forward and overhead to place "x" on the mirror (along the top) and then uses other hand to erase them         Rhomberg - Eyes Opened: 0 (incr sway with minguard assist) Rhomberg - Eyes Closed: 0                Cognition Arousal/Alertness: Awake/alert Behavior During Therapy: Flat affect Overall Cognitive Status: Impaired/Different from baseline Area of Impairment: Orientation, Attention, Memory, Following commands, Safety/judgement, Awareness, Problem solving, Rancho level               Rancho Levels of  Cognitive Functioning Rancho Mirant Scales of Cognitive Functioning: Confused, Inappropriate Non-Agitated Orientation Level: Disoriented to, Time, Situation (he knew he was in the hospital!) Current Attention  Level: Sustained Memory: Decreased recall of precautions, Decreased short-term memory Following Commands: Follows one step commands inconsistently, Follows one step commands with increased time Safety/Judgement: Decreased awareness of safety, Decreased awareness of deficits Awareness: Intellectual Problem Solving: Slow processing, Decreased initiation, Requires verbal cues, Requires tactile cues General Comments: Pt able to state he is in the hospital, does not know month (even when given 2 choices). Following commands with less delay.   Rancho Mirant Scales of Cognitive Functioning: Confused, Inappropriate Non-Agitated    Exercises Other Exercises Other Exercises: sit to stand with slow descent x 3 reps and pt then reporting knee pain "they are just worn out"    General Comments General comments (skin integrity, edema, etc.): Fiance and sitter present      Pertinent Vitals/Pain Pain Assessment Pain Assessment: Faces Faces Pain Scale: Hurts little more Pain Location: bil Knees Pain Descriptors / Indicators: Discomfort, Guarding, Grimacing Pain Intervention(s): Limited activity within patient's tolerance, Monitored during session    Home Living                          Prior Function            PT Goals (current goals can now be found in the care plan section) Acute Rehab PT Goals Patient Stated Goal: to return home Time For Goal Achievement: 01/16/23 Potential to Achieve Goals: Fair Progress towards PT goals: Progressing toward goals    Frequency    Min 4X/week      PT Plan Current plan remains appropriate    Co-evaluation              AM-PAC PT "6 Clicks" Mobility   Outcome Measure  Help needed turning from your back to your side while in a flat bed without using bedrails?: A Little Help needed moving from lying on your back to sitting on the side of a flat bed without using bedrails?: A Little Help needed moving to and from a bed to a  chair (including a wheelchair)?: A Little Help needed standing up from a chair using your arms (e.g., wheelchair or bedside chair)?: A Little Help needed to walk in hospital room?: A Little Help needed climbing 3-5 steps with a railing? : A Lot 6 Click Score: 17    End of Session Equipment Utilized During Treatment: Gait belt Activity Tolerance: Patient tolerated treatment well Patient left: in bed;with nursing/sitter in room;Other (comment);with family/visitor present (enclosure bed) Nurse Communication: Mobility status PT Visit Diagnosis: Other abnormalities of gait and mobility (R26.89);Muscle weakness (generalized) (M62.81);Other symptoms and signs involving the nervous system (R29.898)     Time: 3244-0102 PT Time Calculation (min) (ACUTE ONLY): 18 min  Charges:  $Gait Training: 8-22 mins                      Jerolyn Center, PT Acute Rehabilitation Services  Office [phone]    Zena Amos 01/04/2023, 3:43 PM

## 2023-01-05 ENCOUNTER — Inpatient Hospital Stay (HOSPITAL_COMMUNITY): Payer: 59

## 2023-01-05 ENCOUNTER — Other Ambulatory Visit: Payer: Self-pay

## 2023-01-05 ENCOUNTER — Encounter (HOSPITAL_COMMUNITY): Payer: Self-pay | Admitting: Physical Medicine and Rehabilitation

## 2023-01-05 DIAGNOSIS — S069XAS Unspecified intracranial injury with loss of consciousness status unknown, sequela: Secondary | ICD-10-CM

## 2023-01-05 LAB — CBC WITH DIFFERENTIAL/PLATELET
Abs Immature Granulocytes: 0.03 10*3/uL (ref 0.00–0.07)
Basophils Absolute: 0.1 10*3/uL (ref 0.0–0.1)
Basophils Relative: 1 %
Eosinophils Absolute: 0.5 10*3/uL (ref 0.0–0.5)
Eosinophils Relative: 5 %
HCT: 38.4 % — ABNORMAL LOW (ref 39.0–52.0)
Hemoglobin: 13.3 g/dL (ref 13.0–17.0)
Immature Granulocytes: 0 %
Lymphocytes Relative: 32 %
Lymphs Abs: 3.2 10*3/uL (ref 0.7–4.0)
MCH: 32.8 pg (ref 26.0–34.0)
MCHC: 34.6 g/dL (ref 30.0–36.0)
MCV: 94.6 fL (ref 80.0–100.0)
Monocytes Absolute: 0.8 10*3/uL (ref 0.1–1.0)
Monocytes Relative: 8 %
Neutro Abs: 5.4 10*3/uL (ref 1.7–7.7)
Neutrophils Relative %: 54 %
Platelets: 630 10*3/uL — ABNORMAL HIGH (ref 150–400)
RBC: 4.06 MIL/uL — ABNORMAL LOW (ref 4.22–5.81)
RDW: 12.4 % (ref 11.5–15.5)
WBC: 10 10*3/uL (ref 4.0–10.5)
nRBC: 0 % (ref 0.0–0.2)

## 2023-01-05 LAB — COMPREHENSIVE METABOLIC PANEL
ALT: 43 U/L (ref 0–44)
AST: 20 U/L (ref 15–41)
Albumin: 3.3 g/dL — ABNORMAL LOW (ref 3.5–5.0)
Alkaline Phosphatase: 102 U/L (ref 38–126)
Anion gap: 8 (ref 5–15)
BUN: 5 mg/dL — ABNORMAL LOW (ref 6–20)
CO2: 26 mmol/L (ref 22–32)
Calcium: 9.2 mg/dL (ref 8.9–10.3)
Chloride: 102 mmol/L (ref 98–111)
Creatinine, Ser: 0.74 mg/dL (ref 0.61–1.24)
GFR, Estimated: >60 mL/min (ref >60)
Glucose, Bld: 95 mg/dL (ref 70–99)
Potassium: 3.7 mmol/L (ref 3.5–5.1)
Sodium: 136 mmol/L (ref 135–145)
Total Bilirubin: 0.5 mg/dL (ref 0.3–1.2)
Total Protein: 7.4 g/dL (ref 6.5–8.1)

## 2023-01-05 LAB — GLUCOSE, CAPILLARY: Glucose-Capillary: 92 mg/dL (ref 70–99)

## 2023-01-05 MED ORDER — METHOCARBAMOL 500 MG PO TABS
1000.0000 mg | ORAL_TABLET | Freq: Three times a day (TID) | ORAL | Status: DC | PRN
  Administered 2023-01-10 (×3): 1000 mg via ORAL
  Filled 2023-01-05 (×3): qty 2

## 2023-01-05 NOTE — Plan of Care (Signed)
  Problem: RH Cognition - SLP Goal: RH LTG Patient will demonstrate orientation with cues Description:  LTG:  Patient will demonstrate orientation to person/place/time/situation with cues (SLP)   Flowsheets (Taken 01/05/2023 1208) LTG Patient will demonstrate orientation to:  Person  Place  Time  Situation LTG: Patient will demonstrate orientation using cueing (SLP): Minimal Assistance - Patient > 75%   Problem: RH Comprehension Communication Goal: LTG Patient will comprehend basic/complex auditory (SLP) Description: LTG: Patient will comprehend basic/complex auditory information with cues (SLP). Flowsheets (Taken 01/05/2023 1208) LTG: Patient will comprehend auditory information with cueing (SLP): Minimal Assistance - Patient > 75%   Problem: RH Expression Communication Goal: LTG Patient will increase word finding of common (SLP) Description: LTG:  Patient will increase word finding of common objects/daily info/abstract thoughts with cues using compensatory strategies (SLP). Flowsheets (Taken 01/05/2023 1208) LTG: Patient will increase word finding of common (SLP): Minimal Assistance - Patient > 75%   Problem: RH Memory Goal: LTG Patient will demonstrate ability for day to day (SLP) Description: LTG:   Patient will demonstrate ability for day to day recall/carryover during cognitive/linguistic activities with assist  (SLP) Flowsheets (Taken 01/05/2023 1208) LTG: Patient will demonstrate ability for day to day recall/carryover during cognitive/linguistic activities with assist (SLP): Minimal Assistance - Patient > 75% Goal: LTG Patient will use memory compensatory aids to (SLP) Description: LTG:  Patient will use memory compensatory aids to recall biographical/new, daily complex information with cues (SLP) Flowsheets (Taken 01/05/2023 1208) LTG: Patient will use memory compensatory aids to (SLP): Minimal Assistance - Patient > 75%   Problem: RH Attention Goal: LTG Patient will  demonstrate this level of attention during functional activites (SLP) Description: LTG:  Patient will will demonstrate this level of attention during functional activites (SLP) Flowsheets (Taken 01/05/2023 1208) LTG: Patient will demonstrate this level of attention during cognitive/linguistic activities with assistance of (SLP): Minimal Assistance - Patient > 75%   Problem: RH Awareness Goal: LTG: Patient will demonstrate awareness during functional activites type of (SLP) Description: LTG: Patient will demonstrate awareness during functional activites type of (SLP) Flowsheets (Taken 01/05/2023 1208) LTG: Patient will demonstrate awareness during cognitive/linguistic activities with assistance of (SLP): Minimal Assistance - Patient > 75%

## 2023-01-05 NOTE — Plan of Care (Signed)
Problem: RH Balance Goal: LTG: Patient will maintain dynamic sitting balance (OT) Description: LTG:  Patient will maintain dynamic sitting balance with assistance during activities of daily living (OT) Flowsheets (Taken 01/05/2023 0956) LTG: Pt will maintain dynamic sitting balance during ADLs with: Independent Goal: LTG Patient will maintain dynamic standing with ADLs (OT) Description: LTG:  Patient will maintain dynamic standing balance with assist during activities of daily living (OT)  Flowsheets (Taken 01/05/2023 0956) LTG: Pt will maintain dynamic standing balance during ADLs with: Supervision/Verbal cueing   Problem: Sit to Stand Goal: LTG:  Patient will perform sit to stand in prep for activites of daily living with assistance level (OT) Description: LTG:  Patient will perform sit to stand in prep for activites of daily living with assistance level (OT) Flowsheets (Taken 01/05/2023 0956) LTG: PT will perform sit to stand in prep for activites of daily living with assistance level: Supervision/Verbal cueing   Problem: RH Eating Goal: LTG Patient will perform eating w/assist, cues/equip (OT) Description: LTG: Patient will perform eating with assist, with/without cues using equipment (OT) Flowsheets (Taken 01/05/2023 0956) LTG: Pt will perform eating with assistance level of: Independent   Problem: RH Grooming Goal: LTG Patient will perform grooming w/assist,cues/equip (OT) Description: LTG: Patient will perform grooming with assist, with/without cues using equipment (OT) Flowsheets (Taken 01/05/2023 0956) LTG: Pt will perform grooming with assistance level of: Independent   Problem: RH Bathing Goal: LTG Patient will bathe all body parts with assist levels (OT) Description: LTG: Patient will bathe all body parts with assist levels (OT) Flowsheets (Taken 01/05/2023 0956) LTG: Pt will perform bathing with assistance level/cueing: Supervision/Verbal cueing   Problem: RH  Dressing Goal: LTG Patient will perform upper body dressing (OT) Description: LTG Patient will perform upper body dressing with assist, with/without cues (OT). Flowsheets (Taken 01/05/2023 0956) LTG: Pt will perform upper body dressing with assistance level of: Set up assist Goal: LTG Patient will perform lower body dressing w/assist (OT) Description: LTG: Patient will perform lower body dressing with assist, with/without cues in positioning using equipment (OT) Flowsheets (Taken 01/05/2023 0956) LTG: Pt will perform lower body dressing with assistance level of: Supervision/Verbal cueing   Problem: RH Toileting Goal: LTG Patient will perform toileting task (3/3 steps) with assistance level (OT) Description: LTG: Patient will perform toileting task (3/3 steps) with assistance level (OT)  Flowsheets (Taken 01/05/2023 0956) LTG: Pt will perform toileting task (3/3 steps) with assistance level: Supervision/Verbal cueing   Problem: RH Functional Use of Upper Extremity Goal: LTG Patient will use RT/LT upper extremity as a (OT) Description: LTG: Patient will use right/left upper extremity as a stabilizer/gross assist/diminished/nondominant/dominant level with assist, with/without cues during functional activity (OT) Flowsheets (Taken 01/05/2023 0956) LTG: Use of upper extremity in functional activities: RUE as dominant level   Problem: RH Toilet Transfers Goal: LTG Patient will perform toilet transfers w/assist (OT) Description: LTG: Patient will perform toilet transfers with assist, with/without cues using equipment (OT) Flowsheets (Taken 01/05/2023 0956) LTG: Pt will perform toilet transfers with assistance level of: Supervision/Verbal cueing   Problem: RH Tub/Shower Transfers Goal: LTG Patient will perform tub/shower transfers w/assist (OT) Description: LTG: Patient will perform tub/shower transfers with assist, with/without cues using equipment (OT) Flowsheets (Taken 01/05/2023 0956) LTG: Pt  will perform tub/shower stall transfers with assistance level of: Supervision/Verbal cueing   Problem: RH Attention Goal: LTG Patient will demonstrate this level of attention during functional activites (OT) Description: LTG:  Patient will demonstrate this level of attention during functional activites  (  OT) Flowsheets (Taken 01/05/2023 [phone]) Patient will demonstrate above attention level in the following environment: Home LTG: Patient will demonstrate this level of attention during functional activites (OT): Supervision   Problem: RH Awareness Goal: LTG: Patient will demonstrate awareness during functional activites type of (OT) Description: LTG: Patient will demonstrate awareness during functional activites type of (OT) Flowsheets (Taken 01/05/2023 0956) LTG: Patient will demonstrate awareness during functional activites type of (OT): Supervision

## 2023-01-05 NOTE — Progress Notes (Signed)
Patient ID: Shawn Mills, male   DOB: 01-31-1981, 42 y.o.   MRN: [id number]  SW unable to complete an assessment with pt given injury.   1330- SW made contact with [name] to complete assessment. Reports that the plan is for him to return home to her, and there will be support from his mother, and her dtr as well. Reports she will begin her new job on 01/12/23. SW shared will continue to provide updates as available.    Shawn Mills, MSW, LCSWA Office: [phone] Cell: [phone] Fax: [phone]

## 2023-01-05 NOTE — Progress Notes (Signed)
This nurse notified by Nt that patient was in room holding head and moaning that their head was hurting them the patient vital signs on assessment blood pressure was soft and patient exhibiting s/s of nausea  MD notified with new orders for lorazepam PO and Stat Head Ct to rule out new onset brain bleed. Patient needs met bed in lowest position with call light in reach current plan of care in progress

## 2023-01-05 NOTE — Progress Notes (Signed)
Inpatient Rehabilitation Admission Medication Review by a Pharmacist  A complete drug regimen review was completed for this patient to identify any potential clinically significant medication issues.  High Risk Drug Classes Is patient taking? Indication by Medication  Antipsychotic Yes Quetiapine - mood, agitation  Anticoagulant Yes Enoxaparin - VTE ppx  Antibiotic No   Opioid Yes Oxycodone - PRN pain  Antiplatelet Yes Aspirin - ICA dissection  Hypoglycemics/insulin No   Vasoactive Medication No   Chemotherapy No   Other Yes FA, thiamine, MVI - supplement Methylphenidate - alertness Propranolol- HTN, tremor Ativan - anxiety     Type of Medication Issue Identified Description of Issue Recommendation(s)  Drug Interaction(s) (clinically significant)     Duplicate Therapy     Allergy     No Medication Administration End Date     Incorrect Dose     Additional Drug Therapy Needed     Significant med changes from prior encounter (inform family/care partners about these prior to discharge). All meds new at discharge Communicate medication changes with patient/family at discharge  Other       Clinically significant medication issues were identified that warrant physician communication and completion of prescribed/recommended actions by midnight of the next day:  No   Pharmacist comments: n/a   Time spent performing this drug regimen review (minutes): 20   Thank you for allowing pharmacy to be a part of this patient's care.  Thelma Barge, PharmD Clinical Pharmacist

## 2023-01-05 NOTE — Evaluation (Signed)
Speech Language Pathology Assessment and Plan  Patient Details  Name: Shawn Mills MRN: [id number] Date of Birth: 07-Nov-1980  SLP Diagnosis: Cognitive Impairments;Speech and Language deficits  Rehab Potential: Good ELOS: 20-30 days    Today's Date: 01/05/2023 SLP Individual Time: 1914-7829 SLP Individual Time Calculation (min): 57 min   Hospital Problem: Principal Problem:   TBI (traumatic brain injury) [facility])  Past Medical History:  Past Medical History:  Diagnosis Date   Carpal tunnel syndrome    Kidney stones    Past Surgical History:  Past Surgical History:  Procedure Laterality Date   IR ANGIO INTRA EXTRACRAN SEL COM CAROTID INNOMINATE BILAT MOD SED  12/09/2022   IR ANGIO VERTEBRAL SEL VERTEBRAL BILAT MOD SED  12/09/2022   LITHOTRIPSY      Assessment / Plan / Recommendation Clinical Impression  HPI: Shawn Mills is a 42 year old right-handed with unremarkable past medical history. Per chart review patient lives with significant other. 1 level apartment. Independent prior to admission. Patient works [employer]. Presented 12/07/2022 after falling backwards on his front porch striking the back of his head. Admission chemistries unremarkable except sodium 132 glucose 115 AST 52 ALT 55, WBC 14,100, alcohol 270, lactic acid 2.1. Cranial CT scan showed subdural and subarachnoid hemorrhage along the left frontal lobe and temporal lobe without significant mass effect or midline shift. Nondisplaced right parietal fracture which extends inferiorly to the right lambdoid suture, with diastasis of the right lambdoid suture. Fractures extending through the bilateral sphenoid sinuses and into the bilateral carotid canals. Fracture also extends superiorly through the clivus into the posterior fossa and through the floor the sphenoid sinuses into the nasopharynx. Vertically oriented fracture through the right mastoid which extends into the middle ear, adjacent to the ossicles with likely  hemorrhage around the ossicles in the middle ear. CT cervical spine negative. CT of chest abdomen pelvis unremarkable. Patient did require intubation for airway protection through 12/18/2022. CT angiogram head and neck irregularity and linear filling defects in the left distal petrous segment and possibly in the bilateral cavernous segments, concerning for dissection. No hemodynamically significant stenosis in the neck. 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 No evidence of cortical venous reflux and no occlusion shunting or aneurysms or dissections extracranially or intracranially. Patient was cleared for low-dose aspirin therapy. Follow-up neurosurgery Dr. Franky Macho in regards to traumatic SAH conservative care placed on Keppra x 7 days. ENT follow-up did not recommend operative intervention for facial fractures. EEG negative for seizure. Patient was placed on CIWA for alcohol intoxication. Treated for suspected aspiration pneumonia completed course of antibiotic care. His diet was advanced from mechanical soft nectar thick to a regular diet. Lovenox added for DVT prophylaxis. Patient with ongoing bouts of agitation and restlessness and currently remains on Inderal/Klonopin 0.25 mg twice daily as well as Seroquel. Ritalin was added for activation. An enclosure bed has been used for patient safety. Therapy evaluations completed due to patient's TBI decreased functional mobility was admitted for a comprehensive rehab program.     Skilled Therapeutic Interventions          Swallowing Pt recently advanced to a regular diet and thin liquids on acute care. Pt's fiance reported pt appeared to be tolerating diet advancement well without overt concerns for aspiration.   Today, pt presents with a grossly functional oropharyngeal swallow per clinical swallow assessment. Oral prep and transit prompt with complete oral clearance.  Pharyngeal swallow initiation appeared timely with laryngeal  elevation noted. No overt or subtle s/s of aspiration were observed.   Recommend continue regular diet and thin liquids at this time. Pt may benefit from intermittent supervision during meals due to other impulsive behaviors noted. If impulsivity with PO intake is not observed, then recommend pt be able to consume meals without supervision. No swallowing needs identified at this time.   Cognitive-Linguistic Pt presents with cognitive linguistic impairments in the areas of attention, short term recall, awareness, orientation, problem solving, executive functions, verbal expression, and auditory comprehension. Pt unable to maintain attention for formalized assessment at this time. Pt challenged with describing pictured scene and coherently providing biographical details beyond his name. Pt offers generalized responses to direct questions and is very concerned about being able to go home. He is disoriented to place, situation, and time. He is able to correctly verbalize days of the week in forwards order (independently) and backwards order (min cues for attention). Pt needed frequent redirection to structured and unstructured tasks throughout session. He impulsively stood from bed multiple times, despite redirection. RN present at end of session to help redirect pt back to Posey bed due to increased pt agitation. Pt's behaviors align with [place] at this time.   Pt left in Posey bed with call bell in reach and fiance at bedside. Continue SLP PoC.    SLP Assessment  Patient will need skilled Speech Lanaguage Pathology Services during CIR admission    Recommendations  SLP Diet Recommendations: Age appropriate regular solids;Thin Liquid Administration via: Cup;Straw Medication Administration: Whole meds with liquid Supervision: Intermittent supervision to cue for compensatory strategies Compensations: Slow rate;Small sips/bites Postural  Changes and/or Swallow Maneuvers: Seated upright 90 degrees Oral Care Recommendations: Oral care BID Patient destination: Home Follow up Recommendations: Outpatient SLP Equipment Recommended: None recommended by SLP    SLP Frequency 3 to 5 out of 7 days   SLP Duration  SLP Intensity  SLP Treatment/Interventions 20-30 days  Minumum of 1-2 x/day, 30 to 90 minutes  Cognitive remediation/compensation;Environmental controls;Speech/Language facilitation;Cueing hierarchy;Functional tasks;Therapeutic Activities;Patient/family education    Pain Pain Assessment Pain Scale: 0-10 Pain Score: 0-No pain  Prior Functioning Cognitive/Linguistic Baseline: Within functional limits Type of Home: Apartment  Lives With: Significant other Available Help at Discharge: Family;Available 24 hours/day Vocation: Part time employment (various jobs)  SLP Evaluation Cognition Overall Cognitive Status: Impaired/Different from baseline Arousal/Alertness: Awake/alert Orientation Level: Oriented to person;Disoriented to situation;Disoriented to time;Disoriented to place Year: 2024 Attention: Focused;Sustained Focused Attention: Impaired Focused Attention Impairment: Verbal basic;Functional basic Sustained Attention: Impaired Sustained Attention Impairment: Verbal basic;Functional basic Memory: Impaired Memory Impairment: Retrieval deficit;Decreased recall of new information Awareness: Impaired Awareness Impairment: Emergent impairment Problem Solving: Impaired Problem Solving Impairment: Verbal basic;Functional basic Executive Function: Reasoning;Sequencing;Organizing;Decision Making;Self Monitoring Reasoning: Impaired Reasoning Impairment: Verbal basic Sequencing: Impaired Sequencing Impairment: Verbal basic Organizing: Impaired Organizing Impairment: Verbal basic Decision Making: Impaired Decision Making Impairment: Verbal basic Self Monitoring: Impaired Self Monitoring Impairment: Verbal  basic Behaviors: Impulsive;Verbal agitation Safety/Judgment: Impaired [place] Scales of Cognitive Functioning: Confused, Inappropriate Non-Agitated  Comprehension Auditory Comprehension Overall Auditory Comprehension: Impaired Yes/No Questions: Impaired Basic Biographical Questions: 0-25% accurate Commands: Impaired One Step Basic Commands: 50-74% accurate Conversation: Simple Interfering Components: Attention Visual Recognition/Discrimination Discrimination: Not tested Reading Comprehension Reading Status: Not tested Expression Expression Primary Mode of Expression: Verbal Verbal Expression Overall Verbal Expression: Impaired Pragmatics: Impairment Impairments: Abnormal affect;Interpretation of nonverbal communication;Topic appropriateness;Turn Taking;Topic maintenance Interfering Components: Attention Written Expression Written Expression: Not tested Oral Motor Oral Motor/Sensory Function Overall Oral  Motor/Sensory Function: Within functional limits Motor Speech Overall Motor Speech: Appears within functional limits for tasks assessed Respiration: Within functional limits Phonation: Normal Resonance: Within functional limits Articulation: Within functional limitis Intelligibility: Intelligible Motor Planning: Witnin functional limits Motor Speech Errors: Not applicable  Care Tool Care Tool Cognition Ability to hear (with hearing aid or hearing appliances if normally used Ability to hear (with hearing aid or hearing appliances if normally used): 0. Adequate - no difficulty in normal conservation, social interaction, listening to TV   Expression of Ideas and Wants Expression of Ideas and Wants: 3. Some difficulty - exhibits some difficulty with expressing needs and ideas (e.g, some words or finishing thoughts) or speech is not clear   Understanding Verbal and Non-Verbal Content Understanding Verbal and Non-Verbal Content: 3. Usually understands - understands  most conversations, but misses some part/intent of message. Requires cues at times to understand  Memory/Recall Ability Memory/Recall Ability : None of the above were recalled   Intelligibility: Intelligible  Bedside Swallowing Assessment General Date of Onset: 12/08/22 Previous Swallow Assessment: 01/03/23 Diet Prior to this Study: Regular;Thin liquids (Level 0) Temperature Spikes Noted: No Respiratory Status: Room air History of Recent Intubation: No Behavior/Cognition: Alert;Requires cueing;Distractible;Agitated;Impulsive;Confused Oral Cavity - Dentition: Adequate natural dentition Self-Feeding Abilities: Able to feed self Patient Positioning: Upright in bed Baseline Vocal Quality: Normal  Oral Care Assessment Oral Assessment  (WDL): Within Defined Limits Lips: Symmetrical Tongue: Pink;Moist Mucous Membrane(s): Moist;Pink Saliva: Moist, saliva free flowing Level of Consciousness: Alert Is patient on any of following O2 devices?: None of the above Nutritional status: No high risk factors Oral Assessment Risk : Low Risk Ice Chips Ice chips: Not tested Thin Liquid Thin Liquid: Within functional limits Nectar Thick Nectar Thick Liquid: Not tested Honey Thick Honey Thick Liquid: Not tested Puree Puree: Not tested Solid Solid: Within functional limits BSE Assessment Risk for Aspiration Impact on safety and function: No limitations  Short Term Goals: Week 1: SLP Short Term Goal 1 (Week 1): Pt will utilize external aids to answer orientation questions with >80% accuracy. SLP Short Term Goal 2 (Week 1): Pt will complete functional safety awareness tasks wtih >50% accuracy given min cues. SLP Short Term Goal 3 (Week 1): Pt will sustain attention during structured task for >5 minutes with min cues. SLP Short Term Goal 4 (Week 1): Pt will utilize compensatory word finding strategies in structured tasks with >75% accuracy. SLP Short Term Goal 5 (Week 1): Pt will follow 1-2 step  auditory commands with >80% accuracy.  Refer to Care Plan for Long Term Goals  Recommendations for other services: None   Discharge Criteria: Patient will be discharged from SLP if patient refuses treatment 3 consecutive times without medical reason, if treatment goals not met, if there is a change in medical status, if patient makes no progress towards goals or if patient is discharged from hospital.  The above assessment, treatment plan, treatment alternatives and goals were discussed and mutually agreed upon: by patient and by family  Ellery Plunk 01/05/2023, 12:23 PM

## 2023-01-05 NOTE — Evaluation (Signed)
Physical Therapy Assessment and Plan  Patient Details  Name: Shawn Mills MRN: [id number] Date of Birth: February 26, 1981  PT Diagnosis: Cognitive deficits and Difficulty walking Rehab Potential: Good ELOS: 2-2.5 weeks   Today's Date: 01/05/2023 PT Individual Time: 1300-1359 PT Individual Time Calculation (min): 59 min    Hospital Problem: Principal Problem:   TBI (traumatic brain injury) [facility])   Past Medical History:  Past Medical History:  Diagnosis Date   Carpal tunnel syndrome    Kidney stones    Past Surgical History:  Past Surgical History:  Procedure Laterality Date   IR ANGIO INTRA EXTRACRAN SEL COM CAROTID INNOMINATE BILAT MOD SED  12/09/2022   IR ANGIO VERTEBRAL SEL VERTEBRAL BILAT MOD SED  12/09/2022   LITHOTRIPSY      Assessment & Plan Clinical Impression: Patient is a 42 year old right-handed with unremarkable past medical history. Per chart review patient lives with significant other. 1 level apartment. Independent prior to admission. Patient works Holiday representative. Presented 12/07/2022 after falling backwards on his front porch striking the back of his head. Admission chemistries unremarkable except sodium 132 glucose 115 AST 52 ALT 55, WBC 14,100, alcohol 270, lactic acid 2.1. Cranial CT scan showed subdural and subarachnoid hemorrhage along the left frontal lobe and temporal lobe without significant mass effect or midline shift. Nondisplaced right parietal fracture which extends inferiorly to the right lambdoid suture, with diastasis of the right lambdoid suture. Fractures extending through the bilateral sphenoid sinuses and into the bilateral carotid canals. Fracture also extends superiorly through the clivus into the posterior fossa and through the floor the sphenoid sinuses into the nasopharynx. Vertically oriented fracture through the right mastoid which extends into the middle ear, adjacent to the ossicles with likely hemorrhage around the ossicles in the middle ear. CT  cervical spine negative. CT of chest abdomen pelvis unremarkable. Patient did require intubation for airway protection through 12/18/2022. CT angiogram head and neck irregularity and linear filling defects in the left distal petrous segment and possibly in the bilateral cavernous segments, concerning for dissection. No hemodynamically significant stenosis in the neck. 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 No evidence of cortical venous reflux and no occlusion shunting or aneurysms or dissections extracranially or intracranially. Patient was cleared for low-dose aspirin therapy. Follow-up neurosurgery Dr. Franky Macho in regards to traumatic SAH conservative care placed on Keppra x 7 days. ENT follow-up did not recommend operative intervention for facial fractures. EEG negative for seizure. Patient was placed on CIWA for alcohol intoxication. Treated for suspected aspiration pneumonia completed course of antibiotic care. His diet was advanced from mechanical soft nectar thick to a regular diet. Lovenox added for DVT prophylaxis. Patient with ongoing bouts of agitation and restlessness and currently remains on Inderal/Klonopin 0.25 mg twice daily as well as Seroquel. Ritalin was added for activation. An enclosure bed has been used for patient safety.   Patient transferred to CIR on 01/04/2023 .   Patient currently requires min with mobility secondary to muscle weakness, decreased cardiorespiratoy endurance, decreased coordination, decreased attention to right, decreased attention, decreased awareness, decreased problem solving, decreased safety awareness, decreased memory, delayed processing, and demonstrates behaviors consistent with Rancho Level V, and decreased standing balance, decreased postural control, hemiplegia, and decreased balance strategies.  Prior to hospitalization, patient was independent  with mobility and lived  with Significant other in a Apartment home.  Home access is  Level entry.  Patient will benefit from skilled PT intervention to maximize  safe functional mobility, minimize fall risk, and decrease caregiver burden for planned discharge home with 24 hour supervision.  Anticipate patient will benefit from follow up OP at discharge.      PT Evaluation Precautions/Restrictions Precautions Precautions: Fall Precaution Comments: enclosure bed Restrictions Weight Bearing Restrictions: No General Family/Caregiver Present: No  Pain Pain Assessment Pain Scale: 0-10 Pain Score: Asleep Pain Type: Acute pain Pain Location: Back Pain Orientation: Left;Right Pain Descriptors / Indicators: Aching;Discomfort Pain Frequency: Intermittent Pain Onset: On-going Patients Stated Pain Goal: 0 Pain Intervention(s): Medication (See eMAR) Pain Interference Pain Interference Pain Effect on Sleep: 1. Rarely or not at all Pain Interference with Therapy Activities: 1. Rarely or not at all Pain Interference with Day-to-Day Activities: 1. Rarely or not at all Home Living/Prior Functioning Home Living Available Help at Discharge: Family;Available 24 hours/day Type of Home: Apartment  Lives With: Significant other Prior Function Vocation: Part time employment (various jobs) Vision/Perception  Vision - History Ability to See in Adequate Light: 0 Adequate Perception Perception: Impaired Inattention/Neglect:  (mild R inattention)  Cognition Overall Cognitive Status: Impaired/Different from baseline Arousal/Alertness: Awake/alert Orientation Level: Oriented to person;Disoriented to situation;Disoriented to time;Disoriented to place Year: 2024 Attention: Focused;Sustained Focused Attention: Impaired Focused Attention Impairment: Verbal basic;Functional basic Sustained Attention: Impaired Sustained Attention Impairment: Verbal basic;Functional basic Memory: Impaired Memory Impairment: Retrieval  deficit;Decreased recall of new information Awareness: Impaired Awareness Impairment: Emergent impairment Problem Solving: Impaired Problem Solving Impairment: Verbal basic;Functional basic Executive Function: Reasoning;Sequencing;Organizing;Decision Making;Self Monitoring Reasoning: Impaired Reasoning Impairment: Verbal basic Sequencing: Impaired Sequencing Impairment: Verbal basic Organizing: Impaired Organizing Impairment: Verbal basic Decision Making: Impaired Decision Making Impairment: Verbal basic Self Monitoring: Impaired Self Monitoring Impairment: Verbal basic Behaviors: Impulsive;Verbal agitation Safety/Judgment: Impaired Rancho [employer] Scales of Cognitive Functioning: Confused, Inappropriate Non-Agitated Sensation Sensation Light Touch: Appears Intact Additional Comments: Difficult to assess, unclear with answer Coordination Gross Motor Movements are Fluid and Coordinated: No Fine Motor Movements are Fluid and Coordinated: No Coordination and Movement Description: decreased smoothness and accuracy with R hand Finger Nose Finger Test: R dysmetria Motor  Motor Motor: Hemiplegia Motor - Skilled Clinical Observations: Mild weakness on R side  Trunk/Postural Assessment  Cervical Assessment Cervical Assessment: Within Functional Limits Thoracic Assessment Thoracic Assessment: Within Functional Limits Lumbar Assessment Lumbar Assessment: Within Functional Limits Postural Control Postural Control: Deficits on evaluation (delayed and inadeuate righting reactions)  Balance Balance Balance Assessed: Yes Dynamic Sitting Balance Dynamic Sitting - Balance Support: Feet supported Dynamic Sitting - Level of Assistance: 5: Stand by assistance Static Standing Balance Static Standing - Balance Support: During functional activity Static Standing - Level of Assistance: 4: Min assist Dynamic Standing Balance Dynamic Standing - Balance Support: During functional  activity Dynamic Standing - Level of Assistance: 4: Min assist Extremity Assessment  RLE Assessment RLE Assessment: Exceptions to [facility] General Strength Comments: Grossly 4+/5 LLE Assessment LLE Assessment: Within Functional Limits  Care Tool Care Tool Bed Mobility Roll left and right activity   Roll left and right assist level: Supervision/Verbal cueing    Sit to lying activity   Sit to lying assist level: Supervision/Verbal cueing    Lying to sitting on side of bed activity   Lying to sitting on side of bed assist level: the ability to move from lying on the back to sitting on the side of the bed with no back support.: Supervision/Verbal cueing     Care Tool Transfers Sit to stand transfer   Sit to stand assist level: Minimal Assistance - Patient > 75%  Chair/bed transfer   Chair/bed transfer assist level: Minimal Assistance - Patient > 75%     Toilet transfer   Assist Level: Minimal Assistance - Patient > 75%    Car transfer   Car transfer assist level: Minimal Assistance - Patient > 75%      Care Tool Locomotion Ambulation   Assist level: Minimal Assistance - Patient > 75% Assistive device: No Device Max distance: 300'  Walk 10 feet activity   Assist level: Minimal Assistance - Patient > 75% Assistive device: No Device   Walk 50 feet with 2 turns activity   Assist level: Minimal Assistance - Patient > 75% Assistive device: No Device  Walk 150 feet activity   Assist level: Minimal Assistance - Patient > 75% Assistive device: No Device  Walk 10 feet on uneven surfaces activity   Assist level: Minimal Assistance - Patient > 75%    Stairs   Assist level: Moderate Assistance - Patient - 50 - 74% Stairs assistive device: 2 hand rails Max number of stairs: 12  Walk up/down 1 step activity   Walk up/down 1 step (curb) assist level: Moderate Assistance - Patient - 50 - 74% Walk up/down 1 step or curb assistive device: 2 hand rails  Walk up/down 4 steps activity    Walk up/down 4 steps assist level: Moderate Assistance - Patient - 50 - 74% Walk up/down 4 steps assistive device: 2 hand rails  Walk up/down 12 steps activity   Walk up/down 12 steps assist level: Moderate Assistance - Patient - 50 - 74% Walk up/down 12 steps assistive device: 2 hand rails  Pick up small objects from floor   Pick up small object from the floor assist level: Minimal Assistance - Patient > 75%    Wheelchair Is the patient using a wheelchair?: No          Wheel 50 feet with 2 turns activity      Wheel 150 feet activity        Refer to Care Plan for Long Term Goals  SHORT TERM GOAL WEEK 1 PT Short Term Goal 1 (Week 1): Pt will perform basic transfers with CGA consistently. PT Short Term Goal 2 (Week 1): Pt will ambulate x300' with CGA consistently. PT Short Term Goal 3 (Week 1): Pt safety awareness will improve, only requiring min cueing to remain on task during session.  Recommendations for other services: None   Skilled Therapeutic Intervention  Evaluation completed (see details above and below) with education on PT POC and goals and individual treatment initiated with focus on bed mobility, balance, transfers, safety awareness, ambulation, and stair training. Pt received supine in posey bed, requesting to be "let out". Pt verbalizes pain in lower extremities but is unable to provide laterality. PT provides rest breaks as needed to manage pain. Pt performs bed mobility with cues for positioning at EOB for safety. PT provides cues to don socks prior to OOB mobility. Sit to stand with minA and cues for initiation. Pt ambulates throughout session with CGA/minA for stability and due to slight Rt sided inattention. PT provides cues for upright gaze to improve balance safety, and redirecting pt to task throughout session. Pt verbally agitated for much of session, demanding to be allowed to leave. PT calmly provides explanation multiple times of pt's situation and rationale  for staying in CIR. Pt minimally redirectable, but never attempts to elope during session. Pt completes x7:00 on Nustep for endurance training as well as challenging pt's attention to  task, requiring mod cues to remain on task and for hand and foot placement. Pt completes on workload of 7 with average steps per minute ~45. Pt completes car transfer and ramp navigation with minA and cues for sequencing and safety. Pt completes x12 6" steps with bilateral handrails and modA due to tripping over step as pt lacks safety awareness and not following cues to "slow down". Several seated rest breaks taken throughout session. Pt appears to fatigue during session and becomes less agitated toward end of session. Pt returns to enclosure bed with minA. Left supine in enclosure bed. Call bell in reach.   Mobility Bed Mobility Bed Mobility: Supine to Sit;Sit to Supine Supine to Sit: Supervision/Verbal cueing Sit to Supine: Supervision/Verbal cueing Transfers Transfers: Sit to Stand;Stand to Sit;Stand Pivot Transfers Sit to Stand: Minimal Assistance - Patient > 75% Stand to Sit: Minimal Assistance - Patient > 75% Stand Pivot Transfers: Minimal Assistance - Patient > 75% Stand Pivot Transfer Details: Verbal cues for sequencing;Verbal cues for gait pattern;Verbal cues for technique Locomotion  Gait Ambulation: Yes Gait Assistance: Minimal Assistance - Patient > 75% Gait Distance (Feet): 300 Feet Assistive device: None Gait Assistance Details: Verbal cues for sequencing;Verbal cues for gait pattern;Verbal cues for technique Gait Gait: Yes Gait Pattern: Impaired Gait Pattern:  (antalgic) Stairs / Additional Locomotion Stairs: Yes Stairs Assistance: Moderate Assistance - Patient 50 - 74% Stair Management Technique: Two rails Number of Stairs: 12 Height of Stairs: 6 Ramp: Minimal Assistance - Patient >75% Curb: Minimal Assistance - Patient >75% Wheelchair Mobility Wheelchair Mobility: No   Discharge  Criteria: Patient will be discharged from PT if patient refuses treatment 3 consecutive times without medical reason, if treatment goals not met, if there is a change in medical status, if patient makes no progress towards goals or if patient is discharged from hospital.  The above assessment, treatment plan, treatment alternatives and goals were discussed and mutually agreed upon: by patient  Beau Fanny 01/05/2023, 4:11 PM

## 2023-01-05 NOTE — Progress Notes (Signed)
Inpatient Rehabilitation Care Coordinator Assessment and Plan Patient Details  Name: Shawn Mills MRN: [id number] Date of Birth: 1981-05-09  Today's Date: 01/05/2023  Hospital Problems: Principal Problem:   TBI (traumatic brain injury) [facility])  Past Medical History:  Past Medical History:  Diagnosis Date   Carpal tunnel syndrome    Kidney stones    Past Surgical History:  Past Surgical History:  Procedure Laterality Date   IR ANGIO INTRA EXTRACRAN SEL COM CAROTID INNOMINATE BILAT MOD SED  12/09/2022   IR ANGIO VERTEBRAL SEL VERTEBRAL BILAT MOD SED  12/09/2022   LITHOTRIPSY     Social History:  reports current alcohol use. He reports that he does not use drugs. No history on file for tobacco use.  Family / Support Systems Marital Status: Divorced How Long?: 2017 Patient Roles: Other (Comment) Spouse/Significant Other: Shawn Mills (significant other). Children: No children Other Supports: pt mother and partner's daughter Anticipated Caregiver: pt s/o Shawn Mills Ability/Limitations of Caregiver: Shawn Mills will begin a new job on 7/5. 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 Pt partner reports she needs  a letter for his employer to indicate how long he will be out of work. Legal History/Current Legal Issues: Pt s/o reports 20+ yrs ago he was in jail, however, does not report any issues since Guardian/Conservator: Denies   Abuse/Neglect Abuse/Neglect Assessment  Can Be Completed: Yes Physical Abuse: Denies Verbal Abuse: Denies Sexual Abuse: Denies Exploitation of patient/patient's resources: Denies Self-Neglect: Denies  Patient response to: Social Isolation - How often do you feel lonely or isolated from those around you?: Never  Emotional Status Pt's affect, behavior and adjustment status: Pt unable to complete assessment Recent Psychosocial Issues: Pt s/o denies Psychiatric History: She reports to a hx of ADHD and depression Substance Abuse History: Reports that he smokes 1-2 packs of cigarette per day; etoh- daily, 12pm of beer; etoh- occassional marijuana use. 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  Patient / Family Perceptions, Expectations & Goals Pt/Family understanding of illness & functional limitations: Pt family has a general understanding of care needs Premorbid pt/family roles/activities: Independent Anticipated changes in roles/activities/participation: Assistance with ADLs/IADLs Pt/family expectations/goals: Goal is for him to work on communication, independence and self-care.  Community Resources [employer]: None Premorbid Home Care/DME Agencies: None Transportation available at discharge: TBD Is the patient able to respond to transportation needs?: Yes In the past 12 months, has lack of transportation kept you from medical appointments or from getting medications?: No In the past 12 months, has lack of transportation kept you from meetings, work, or from getting things needed for daily living?: No Resource referrals recommended: Neuropsychology  Discharge Planning Living Arrangements: Spouse/significant other Support Systems: Spouse/significant other, Parent, Other relatives Type of Residence: Private residence Insurance Resources: Media planner (specify) Administrator [facility] (primary) and [place] Medicaid [employer]) Financial Resources: Employment Financial Screen Referred: No Living Expenses:  Rent Money Management: Spouse Does the patient have any problems obtaining your medications?: No Home Management: Pt prepared all  meals; and he and his partner split housekeeping duties. Patient/Family Preliminary Plans: Pt wife will take on additional roles Care Coordinator Barriers to Discharge: Decreased caregiver support, Lack of/limited family support, Insurance for SNF coverage Care Coordinator Anticipated Follow Up Needs: HH/OP  Clinical Impression SW completed assessment with pt partner-Shawn Mills. He is not a Cytogeneticist. No HCPOA. No DME.   [place] Teters A Valera Vallas 01/05/2023, 4:40 PM

## 2023-01-05 NOTE — Progress Notes (Signed)
Inpatient Rehabilitation  Patient information reviewed and entered into eRehab system by Challen Spainhour Ellean Firman, OTR/L, Rehab Quality Coordinator.   Information including medical coding, functional ability and quality indicators will be reviewed and updated through discharge.   

## 2023-01-05 NOTE — Plan of Care (Signed)
   01/05/23 0700  Behavioral Plan Guideline  Rancho Level IV  Behavior to decrease/eliminate Decrease verbal agitation with staff;Increase participation;Decrease impulsivity;Decrease elopement risk;Decrease restlessness  Changes to environment Minimize in room stimulation;Door closed to decrease hallway noise;Place signs in room to remind pt to call for assistance  Minimize in room stimulation  Limit visitors to no more than 2 at a time  Interventions Enclosure bed;Mobilize on unit with staff or family (w/c level or ambulatory if appropriate);Limit nighttime interruptions  Recommendations for interactions with patient Redirect behaviors to alternate tasks/topics;Remain calm and reduce environment stimulation with agitation;DO NOT argue with patient;Provide gentle orientation or indirect orientation;Offer toileting with every interaction;Offer patient preferred music;Offer snacks every interaction;Offer fluids every interaction  In Attendance at Behavior Plan Meeting  OT;RN;PT  Initial behavioral plan 01/05/2023 Fransisco Beau RN, Malachi Pro PT, Roney Mans OT

## 2023-01-05 NOTE — Progress Notes (Signed)
PROGRESS NOTE   Subjective/Complaints:  Notified by nursing this a.m. that patient requested to leave AMA.  Per therapies, continues to perseverate on this but is redirectable.  During our conversation, continues to have frequent substitution/neoglisms, with poor insight and generally inappropriate responses to questions.  Able to gather that patient's cousin committed suicide, and patient believes that he needs to leave the hospital to go pick him up.  Discussed with patient that if he needs to attend a funeral or event with family, we may be able to make accommodations for temporary pass, but reiterated that he is not yet ready to leave the hospital.  He was not agitated, but did not acknowledge his deficits or our discussion in any way, continued to state that he was going to pick up his cousin.  Also endorsing pain in his right calf, unable to describe, states it is "just pain".  Pain extends down into his plantar foot, is worse with plantarflexion and calf palpation.  No edema, pulses 2+, capillary refill brisk.  ROS:  + R calf pain Unable to obtain ROS due to patient cognitive status.    Objective:   No results found. Recent Labs    01/04/23 1931 01/05/23 0607  WBC 8.1 10.0  HGB 12.6* 13.3  HCT 36.5* 38.4*  PLT 660* 630*   Recent Labs    01/04/23 1931 01/05/23 0607  NA  --  136  K  --  3.7  CL  --  102  CO2  --  26  GLUCOSE  --  95  BUN  --  5*  CREATININE 0.76 0.74  CALCIUM  --  9.2    Intake/Output Summary (Last 24 hours) at 01/05/2023 1542 Last data filed at 01/05/2023 1253 Gross per 24 hour  Intake 820 ml  Output 400 ml  Net 420 ml        Physical Exam: Vital Signs Blood pressure 125/76, pulse 79, temperature 98.1 F (36.7 C), resp. rate 19, height 5\' 10"  (1.778 m), SpO2 99 %. Constitutional: No apparent distress. Appropriate appearance for age.  Initially laying in fetal bed. HENT: Atraumatic,  normocephalic.  Resolving bruising. Eyes: PERRLA. EOMI, some mild nystagmus with right greater than left gaze. Cardiovascular: RRR, no murmurs/rub/gallops. No Edema. Peripheral pulses 2+  Respiratory: CTAB. No rales, rhonchi, or wheezing. On RA.  Abdomen: + bowel sounds, normoactive. No distention or tenderness.  Skin: C/D/I. No apparent lesions.    MSK:      No apparent deformity. +TTP R calf, plantar foot      Strength: All 4 limbs antigravity and against resistance, 5 out of 5   Neurologic exam:  Cognition: AAO to person, "here"/"hospital", and June.  Not oriented to day or place further with cues.  Can follow simple one-step commands  + Perseveration, poor attention, poor comprehension   Language: Fluent, multiple substitutions/neoglisms.  Can name 1 of 3 items correctly. no dysarthria.  Intact repetition. Memory: Recalls 1/3 objects at 5 minutes.   Insight: Poor insight into current condition.  Mood: Elevated/manic affect, appropriate mood.   Sensation: To light touch intact in BL UEs and LEs  Reflexes: 2+ in BL UE and LEs.  Negative Hoffman's and babinski signs bilaterally.  CN: Right facial droop.  Mild right hearing deficit. Coordination: No apparent tremors. No ataxia on FTN, HTS bilaterally.  Spasticity: MAS 0 in all extremities.    Assessment/Plan: 1. Functional deficits which require 3+ hours per day of interdisciplinary therapy in a comprehensive inpatient rehab setting. Physiatrist is providing close team supervision and 24 hour management of active medical problems listed below. Physiatrist and rehab team continue to assess barriers to discharge/monitor patient progress toward functional and medical goals  Care Tool:  Bathing    Body parts bathed by patient: Right arm, Left arm, Chest, Abdomen, Front perineal area, Buttocks, Right upper leg, Left upper leg, Face, Left lower leg, Right lower leg         Bathing assist Assist Level: Minimal Assistance - Patient >  75%     Upper Body Dressing/Undressing Upper body dressing   What is the patient wearing?: Pull over shirt    Upper body assist Assist Level: Minimal Assistance - Patient > 75%    Lower Body Dressing/Undressing Lower body dressing      What is the patient wearing?: Pants     Lower body assist Assist for lower body dressing: Minimal Assistance - Patient > 75%     Toileting Toileting    Toileting assist Assist for toileting: Minimal Assistance - Patient > 75%     Transfers Chair/bed transfer  Transfers assist     Chair/bed transfer assist level: Minimal Assistance - Patient > 75%     Locomotion Ambulation   Ambulation assist              Walk 10 feet activity   Assist           Walk 50 feet activity   Assist           Walk 150 feet activity   Assist           Walk 10 feet on uneven surface  activity   Assist           Wheelchair     Assist               Wheelchair 50 feet with 2 turns activity    Assist            Wheelchair 150 feet activity     Assist          Blood pressure 125/76, pulse 79, temperature 98.1 F (36.7 C), resp. rate 19, height 5\' 10"  (1.778 m), SpO2 99 %.  1. 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  Antithrombotics: -DVT/anticoagulation:  Pharmaceutical: Lovenox             -antiplatelet therapy: Aspirin 81 mg daily 3. Pain Management: Oxycodone as needed,   - Robaxin 1000 mg 3 times daily as needed added for right calf pain, spasm  4. Mood/Behavior/Sleep: Open 0.25 mg twice daily, Ritalin 10 mg twice daily, Inderal 20 mg 3 times daily             -antipsychotic agents: Seroquel 100 mg every morning and 200 mg nightly              - Sleep log 6/28              -  veil bed for impulsivity, DC IV on admission d/t limiting lines/drains   5. Neuropsych/cognition: This patient is not capable of making decisions on his own behalf.              - Rec neuropsych consult   - Patient easily redirectable for now, if attempts AMA he does not have capacity for decision-making.  Has Ativan as needed for anxiety, agitation  6. Skin/Wound Care: Routine skin checks 7. Fluids/Electrolytes/Nutrition/Dysphagia: Routine in and outs with follow-up chemistries              - Cortrak Dced 6/26  - eating 20-25% meals, encourage POs   8.  Acute hypoxic respiratory failure.  Patient extubated 6/10. 9.  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  Maintained on low-dose aspirin 10.  Alcohol intoxication.  Provide counseling.  Monitor for any signs of withdrawal.  -May attempt to wean of clonazepam 0.25 mg twice daily early next week  11.  MRSA pneumonia.  Resolved.  Contact precautions.  Antibiotic therapy completed.      LOS: 1 days A FACE TO FACE EVALUATION WAS PERFORMED  Angelina Sheriff 01/05/2023, 3:42 PM

## 2023-01-05 NOTE — Progress Notes (Addendum)
Occupational Therapy Assessment and Plan  Patient Details  Name: Shawn Mills MRN: [id number] Date of Birth: 1981/05/01  OT Diagnosis: cognitive deficits, hemiplegia affecting dominant side, and muscle weakness (generalized) Rehab Potential: Rehab Potential (ACUTE ONLY): Good ELOS: 2 weeks   Today's Date: 01/05/2023 OT Individual Time: 0845-1000 OT Individual Time Calculation (min): 75 min     Hospital Problem: Principal Problem:   TBI (traumatic brain injury) (HCC)   Past Medical History:  Past Medical History:  Diagnosis Date   Carpal tunnel syndrome    Kidney stones    Past Surgical History:  Past Surgical History:  Procedure Laterality Date   IR ANGIO INTRA EXTRACRAN SEL COM CAROTID INNOMINATE BILAT MOD SED  12/09/2022   IR ANGIO VERTEBRAL SEL VERTEBRAL BILAT MOD SED  12/09/2022   LITHOTRIPSY      Assessment & Plan Clinical Impression: Patient is a 42 y.o. year old male with recent admission to the hospital on s/p fall TBI with SAH, SDH, R temporal bone, vertical mastoid fx, parietal skull fx intubated 5/31- extubated 6/10 possible ICA dissection, fx sphenoid bones into carotid canals CIWA ETOH + PMH: none. Patient transferred to CIR on 01/04/2023 .    Patient currently requires min with basic self-care skills secondary to muscle weakness, decreased attention to left, decreased attention, decreased awareness, decreased problem solving, decreased safety awareness, decreased memory, delayed processing, and demonstrates behaviors consistent with [place] Level V emerging VI, and decreased sitting balance, decreased standing balance, decreased postural control, hemiplegia, and decreased balance strategies.  Prior to hospitalization, patient could complete BADL with independent..  Patient will benefit from skilled intervention to increase independence with basic self-care skills prior to discharge home with care partner.  Anticipate patient will require 24 hour supervision and follow up  outpatient.  OT - End of Session Endurance Deficit: Yes Endurance Deficit Description: rest breaks within BADL tasks OT Assessment Rehab Potential (ACUTE ONLY): Good OT Patient demonstrates impairments in the following area(s): Balance;Behavior;Cognition;Endurance;Motor;Pain;Perception;Safety;Vision OT Basic ADL's Functional Problem(s): Eating;Grooming;Dressing;Bathing;Toileting OT Transfers Functional Problem(s): Toilet;Tub/Shower OT Additional Impairment(s): Fuctional Use of Upper Extremity OT Plan OT Intensity: Minimum of 1-2 x/day, 45 to 90 minutes OT Frequency: 5 out of 7 days OT Duration/Estimated Length of Stay: 2.5-3 weeks OT Treatment/Interventions: [employer] reintegration;Discharge planning;Disease mangement/prevention;DME/adaptive equipment instruction;Functional electrical stimulation;Functional mobility training;Neuromuscular re-education;Pain management;Patient/family education;Psychosocial support;Self Care/advanced ADL retraining;Skin care/wound managment;Splinting/orthotics;Therapeutic Activities;Therapeutic Exercise;UE/LE Strength taining/ROM;UE/LE Coordination activities;Visual/perceptual remediation/compensation;Wheelchair propulsion/positioning;Cognitive remediation/compensation;Balance/vestibular training OT Self Feeding Anticipated Outcome(s): Mod I OT Basic Self-Care Anticipated Outcome(s): Supervision OT Toileting Anticipated Outcome(s): Supervision OT Bathroom Transfers Anticipated Outcome(s): Supervision OT Recommendation Recommendations for Other Services: Neuropsych consult Patient destination: Home Follow Up Recommendations: Outpatient OT Equipment Recommended: To be determined   OT Evaluation Precautions/Restrictions  Precautions Precautions: Fall Precaution Comments: enclosure bed Restrictions Weight Bearing Restrictions: No Pain Pt reported generalized pain all over, but it seemed to be more in the knees and back. Home Living/Prior  Functioning Home Living Available Help at Discharge: Family, Available 24 hours/day Type of Home: Apartment Home Access: Level entry Home Layout: One level Bathroom Shower/Tub: Associate Professor: Yes 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, enjoys working on cars and racing  Lives With: Significant other, Other (Comment) (fiance's daughter) IADL History Current License: Yes Prior Function Level of Independence: Independent with basic ADLs, Independent with homemaking with ambulation Vision Baseline Vision/History: 0 No visual deficits Ability to See in Adequate Light: 0 Adequate Additional Comments: Able to track in all 4 directions Perception  Perception: Impaired Inattention/Neglect:  (Mild  R inattention) Cognition Cognition Overall Cognitive Status: Impaired/Different from baseline Arousal/Alertness: Awake/alert Orientation Level: Person;Place;Situation Person: Disoriented Place: Disoriented Situation: Disoriented Memory: Impaired Attention: Focused Focused Attention: Impaired Focused Attention Impairment: Verbal basic;Functional basic Problem Solving: Impaired Behaviors: Impulsive Safety/Judgment: Impaired [place] [id number] Scales of Cognitive Functioning: Confused, Inappropriate Non-Agitated (V, emerging VI) Brief Interview for Mental Status (BIMS) Repetition of Three Words (First Attempt): 3 Temporal Orientation: Year: Correct Temporal Orientation: Month: No answer Temporal Orientation: Day: Correct Recall: "Sock": No, could not recall Recall: "Blue": Yes, no cue required Recall: "Bed": No, could not recall BIMS Summary Score: 9 Sensation Sensation Light Touch: Appears Intact Additional Comments: Difficult to assess, unclear with answer Coordination Gross Motor Movements are Fluid and Coordinated: No Fine Motor Movements are Fluid and Coordinated: No Coordination and Movement Description: decreased smoothness  and accuracy with R hand Finger Nose Finger Test: R dysmetria Motor  Motor Motor: Hemiplegia Motor - Skilled Clinical Observations: Mild weakness on R side  Balance Balance Balance Assessed: Yes Dynamic Sitting Balance Dynamic Sitting - Balance Support: Feet supported Dynamic Sitting - Level of Assistance: 5: Stand by assistance Static Standing Balance Static Standing - Balance Support: During functional activity Static Standing - Level of Assistance: 4: Min assist Dynamic Standing Balance Dynamic Standing - Balance Support: During functional activity Dynamic Standing - Level of Assistance: 4: Min assist Extremity/Trunk Assessment RUE Assessment RUE Assessment: Exceptions to [facility] General Strength Comments: mild weakness on R side LUE Assessment LUE Assessment: Within Functional Limits  Care Tool Care Tool Self Care Eating   Eating Assist Level: Supervision/Verbal cueing    Oral Care    Oral Care Assist Level: Contact Guard/[place] assist    Bathing   Body parts bathed by patient: Right arm;Left arm;Chest;Abdomen;Front perineal area;Buttocks;Right upper leg;Left upper leg;Face;Left lower leg;Right lower leg     Assist Level: Minimal Assistance - Patient > 75%    Upper Body Dressing(including orthotics)   What is the patient wearing?: Pull over shirt   Assist Level: Minimal Assistance - Patient > 75%    Lower Body Dressing (excluding footwear)   What is the patient wearing?: Pants Assist for lower body dressing: Minimal Assistance - Patient > 75%    Putting on/Taking off footwear   What is the patient wearing?: Non-skid slipper socks Assist for footwear: Minimal Assistance - Patient > 75%       Care Tool Toileting Toileting activity   Assist for toileting: Minimal Assistance - Patient > 75%     Care Tool Bed Mobility Roll left and right activity   Roll left and right assist level: Supervision/Verbal cueing    Sit to lying activity   Sit to lying assist  level: Supervision/Verbal cueing    Lying to sitting on side of bed activity   Lying to sitting on side of bed assist level: the ability to move from lying on the back to sitting on the side of the bed with no back support.: Supervision/Verbal cueing     Care Tool Transfers Sit to stand transfer   Sit to stand assist level: Minimal Assistance - Patient > 75%    Chair/bed transfer   Chair/bed transfer assist level: Minimal Assistance - Patient > 75%     Toilet transfer   Assist Level: Minimal Assistance - Patient > 75%     Care Tool Cognition  Expression of Ideas and Wants Expression of Ideas and Wants: 3. Some difficulty - exhibits some difficulty with expressing needs and ideas (e.g, some words or  finishing thoughts) or speech is not clear  Understanding Verbal and Non-Verbal Content Understanding Verbal and Non-Verbal Content: 3. Usually understands - understands most conversations, but misses some part/intent of message. Requires cues at times to understand   Memory/Recall Ability Memory/Recall Ability : None of the above were recalled   Refer to Care Plan for Long Term Goals  SHORT TERM GOAL WEEK 1 OT Short Term Goal 1 (Week 1): Patient will recall that he is in the hospital with min quetsioning cues. OT Short Term Goal 2 (Week 1): Patient will demonstrate improved safety awareness by sitting down to don pants with min questioning cues. OT Short Term Goal 3 (Week 1): Patient will maintain standing balance at the sink with CGA during functional BADL tasks.  Recommendations for other services: Neuropsych   Skilled Therapeutic Intervention Pt greeted seated EOB  in enclosure bed with nurse tech, handoff to OT. OT eval completed addressing rehab process, OT purpose, POC, ELOS, and goals. Pt perseverative on "getting out of here" but able to be redirected. Pt completed shower level BADLs with overall min A and was able to sequence BADL tasks with min cues. Pt disoriented to place and  situation, stating he was in a garage even when given 3 choices (church, school, or hospital), pt still unable to come up with hospital. Pt ambulated in hallway with min A, but occasional mod A with some lateral LOB and some weakness in R knee. Pt with memory deficits, impulsive, decreased safety awareness, poor recall, and mild R side weakness. See below for further details regarding BADL performance.   ADL Eating: Supervision/safety Grooming: Supervision/safety Upper Body Bathing: Supervision/safety Lower Body Bathing: Minimal assistance Upper Body Dressing: Supervision/safety Lower Body Dressing: Minimal assistance Toileting: Minimal assistance Toilet Transfer: Minimal assistance Walk-In Shower Transfer: Minimal assistance Mobility  Bed Mobility Bed Mobility: Supine to Sit;Sit to Supine Supine to Sit: Supervision/Verbal cueing Sit to Supine: Supervision/Verbal cueing Transfers Sit to Stand: Minimal Assistance - Patient > 75% Stand to Sit: Minimal Assistance - Patient > 75%   Discharge Criteria: Patient will be discharged from OT if patient refuses treatment 3 consecutive times without medical reason, if treatment goals not met, if there is a change in medical status, if patient makes no progress towards goals or if patient is discharged from hospital.  The above assessment, treatment plan, treatment alternatives and goals were discussed and mutually agreed upon: by patient  Mal Amabile 01/05/2023, 9:40 AM

## 2023-01-05 NOTE — Discharge Instructions (Addendum)
Inpatient Rehab Discharge Instructions  [facility] Discharge date and time: No discharge date for patient encounter.   Activities/Precautions/ Functional Status: Activity: activity as tolerated Diet: regular diet Wound Care: Routine skin checks Functional status:  ___ No restrictions     ___ Walk up steps independently ___ 24/7 supervision/assistance   ___ Walk up steps with assistance ___ Intermittent supervision/assistance  ___ Bathe/dress independently ___ Walk with walker     _x__ Bathe/dress with assistance ___ Walk Independently    ___ Shower independently ___ Walk with assistance    ___ Shower with assistance ___ No alcohol     ___ Return to work/school ________  Special Instructions:  No driving smoking or alcohol   COMMUNITY REFERRALS UPON DISCHARGE:    Outpatient: PT     OT    ST             Agency: [facility]    Phone: [phone]             Appointment Date/Time: *Please expect follow-up within 7-10 business days to schedule your appointment. If you have not received follow-up, be sure to contact the site directly.*    My questions have been answered and I understand these instructions. I will adhere to these goals and the provided educational materials after my discharge from the hospital.  Patient/Caregiver Signature _______________________________ Date __________  Clinician Signature _______________________________________ Date __________  Please bring this form and your medication list with you to all your follow-up doctor's appointments.

## 2023-01-06 DIAGNOSIS — S069X3S Unspecified intracranial injury with loss of consciousness of 1 hour to 5 hours 59 minutes, sequela: Secondary | ICD-10-CM

## 2023-01-06 DIAGNOSIS — R519 Headache, unspecified: Secondary | ICD-10-CM

## 2023-01-06 DIAGNOSIS — R451 Restlessness and agitation: Secondary | ICD-10-CM

## 2023-01-06 DIAGNOSIS — F1010 Alcohol abuse, uncomplicated: Secondary | ICD-10-CM

## 2023-01-06 MED ORDER — TOPIRAMATE 25 MG PO TABS
25.0000 mg | ORAL_TABLET | Freq: Every day | ORAL | Status: DC | PRN
  Administered 2023-01-06: 25 mg via ORAL
  Filled 2023-01-06: qty 1

## 2023-01-06 NOTE — Plan of Care (Signed)
  Problem: Consults Goal: RH BRAIN INJURY PATIENT EDUCATION Description: Description: See Patient Education module for eduction specifics Outcome: Progressing   Problem: RH BOWEL ELIMINATION Goal: RH STG MANAGE BOWEL WITH ASSISTANCE Description: STG Manage Bowel with toileting Assistance. Outcome: Progressing Goal: RH STG MANAGE BOWEL W/MEDICATION W/ASSISTANCE Description: STG Manage Bowel with Medication with mod I Assistance. Outcome: Progressing   Problem: RH SAFETY Goal: RH STG ADHERE TO SAFETY PRECAUTIONS W/ASSISTANCE/DEVICE Description: STG Adhere to Safety Precautions With cues Assistance/Device. Outcome: Progressing   Problem: RH COGNITION-NURSING Goal: RH STG USES MEMORY AIDS/STRATEGIES W/ASSIST TO PROBLEM SOLVE Description: STG Uses Memory Aids/Strategies With cues /reminders Assistance to Problem Solve. Outcome: Progressing   Problem: RH PAIN MANAGEMENT Goal: RH STG PAIN MANAGED AT OR BELOW PT'S PAIN GOAL Description: < 4 with prns Outcome: Progressing   Problem: RH KNOWLEDGE DEFICIT BRAIN INJURY Goal: RH STG INCREASE KNOWLEDGE OF SELF CARE AFTER BRAIN INJURY Description: Patient and family will be able to manage care at discharge using educational resources independently Outcome: Progressing   Problem: Safety: Goal: Non-violent Restraint(s) Outcome: Progressing   Problem: Education: Goal: Understanding of CV disease, CV risk reduction, and recovery process will improve Outcome: Progressing Goal: Individualized Educational Video(s) Outcome: Progressing   Problem: Activity: Goal: Ability to return to baseline activity level will improve Outcome: Progressing   Problem: Cardiovascular: Goal: Ability to achieve and maintain adequate cardiovascular perfusion will improve Outcome: Progressing Goal: Vascular access site(s) Level 0-1 will be maintained Outcome: Progressing   Problem: Health Behavior/Discharge Planning: Goal: Ability to safely manage  health-related needs after discharge will improve Outcome: Progressing

## 2023-01-06 NOTE — Progress Notes (Signed)
SLP Cancellation Note  Patient Details Name: Shawn Mills MRN: [id number] DOB: 02/28/1981   Cancelled treatment:       Patient missed 45 minutes of skilled SLP intervention due to pain. Patient asleep upon arrival, patient declined getting out of enclosure bed due to 10/10 headache with reports of nausea.  Patient also reporting feeling warm. Nursing made aware and administered pain medicine. Patient's tempeture checked: 98.1 degrees. SLP provided patient with a sheet and removed the large blanket. Patient left supine in secured enclosure bed with call bell within reach. Will re-attempt time as able.                                                                                                Estera Ozier 01/06/2023, 11:09 AM

## 2023-01-06 NOTE — Progress Notes (Signed)
PROGRESS NOTE   Subjective/Complaints:  Pt very angry and unwilling to speak with me much this morning. Just keeps stating that he wants to "get off this boat" and leave, wants to be picked up by family and then "get his back surgery" so he can come back afterwards. Perseverates on leaving, unwilling to discuss much else.  Reported that he has back pain but won't tell me anything else about it.  States he didn't sleep but doesn't want to do anything about it except "leave this place." Reports LBM 3 days ago but reported in chart as yesterday.  Later reported HA to nursing staff, did not report this to me an hour prior when he was evaluated.    ROS:  + R calf pain Unable to obtain ROS due to patient cognitive status.    Objective:   CT HEAD WO CONTRAST ( )  Result Date: 01/05/2023 CLINICAL DATA:  Headache, sudden, severe Headache, increasing frequency or severity new onset nausea EXAM: CT HEAD WITHOUT CONTRAST TECHNIQUE: Contiguous axial images were obtained from the base of the skull through the vertex without intravenous contrast. RADIATION DOSE REDUCTION: This exam was performed according to the departmental dose-optimization program which includes automated exposure control, adjustment of the mA and/or kV according to patient size and/or use of iterative reconstruction technique. COMPARISON:  CT head 12/08/2022 FINDINGS: Brain: Left frontal lobe encephalomalacia. Subacute 8 mm left posterior subdural hematoma. No evidence of large-territorial acute infarction. No parenchymal hemorrhage. No mass lesion. No extra-axial collection. No mass effect or midline shift. No hydrocephalus. Basilar cisterns are patent. Vascular: No hyperdense vessel. Skull: No acute fracture or focal lesion. Sinuses/Orbits: Complete opacification of the right maxillary. Otherwise paranasal sinuses. Bilateral partial mastoid air cell effusions. The orbits are  unremarkable. Other: None. IMPRESSION: 1. Subacute 8 mm left posterior subdural hematoma. No findings to suggest superimposed acute bleed. 2. No acute intracranial abnormality. 3. Bilateral partial mastoid air cell effusions. Electronically Signed   By: Tish Frederickson M.D.   On: 01/05/2023 18:16   Recent Labs    01/04/23 1931 01/05/23 0607  WBC 8.1 10.0  HGB 12.6* 13.3  HCT 36.5* 38.4*  PLT 660* 630*   Recent Labs    01/04/23 1931 01/05/23 0607  NA  --  136  K  --  3.7  CL  --  102  CO2  --  26  GLUCOSE  --  95  BUN  --  5*  CREATININE 0.76 0.74  CALCIUM  --  9.2    Intake/Output Summary (Last 24 hours) at 01/06/2023 1330 Last data filed at 01/06/2023 0739 Gross per 24 hour  Intake 0 ml  Output --  Net 0 ml        Physical Exam: Vital Signs Blood pressure 130/87, pulse (!) 109, temperature 98.4 F (36.9 C), temperature source Oral, resp. rate 18, height 5\' 10"  (1.778 m), SpO2 100 %. Constitutional: No acute distress but agitated and states he wants to leave. Appropriate appearance for age.  Sitting in enclosure bed HENT: Atraumatic, normocephalic from a distance, did not get close visualization d/t pt refusing to cooperate.  Resolving bruising not well visualized today.  Cardiovascular: unable  to perform but appears well perfused Respiratory: unable to perform but no audible wheezing or abnormal sounds, no increased WOB Abdomen: nondistended, but unable to perform other aspects of exam.  Skin: C/D/I. No apparent lesions but unable to perform full eval Neuro: perseverates on leaving the hospital, won't really cooperate with exam, somewhat agitated but remained in bed and didn't get aggressive.    PRIOR EXAMS: Eyes: PERRLA. EOMI, some mild nystagmus with right greater than left gaze. MSK:      No apparent deformity. +TTP R calf, plantar foot      Strength: All 4 limbs antigravity and against resistance, 5 out of 5   Neurologic exam:  Cognition: AAO to person,  "here"/"hospital", and June.  Not oriented to day or place further with cues.  Can follow simple one-step commands  + Perseveration, poor attention, poor comprehension   Language: Fluent, multiple substitutions/neoglisms.  Can name 1 of 3 items correctly. no dysarthria.  Intact repetition. Memory: Recalls 1/3 objects at 5 minutes.   Insight: Poor insight into current condition.  Mood: Elevated/manic affect, appropriate mood.   Sensation: To light touch intact in BL UEs and LEs  Reflexes: 2+ in BL UE and LEs. Negative Hoffman's and babinski signs bilaterally.  CN: Right facial droop.  Mild right hearing deficit. Coordination: No apparent tremors. No ataxia on FTN, HTS bilaterally.  Spasticity: MAS 0 in all extremities.    Assessment/Plan: 1. Functional deficits which require 3+ hours per day of interdisciplinary therapy in a comprehensive inpatient rehab setting. Physiatrist is providing close team supervision and 24 hour management of active medical problems listed below. Physiatrist and rehab team continue to assess barriers to discharge/monitor patient progress toward functional and medical goals  Care Tool:  Bathing    Body parts bathed by patient: Right arm, Left arm, Chest, Abdomen, Front perineal area, Buttocks, Right upper leg, Left upper leg, Face, Left lower leg, Right lower leg         Bathing assist Assist Level: Minimal Assistance - Patient > 75%     Upper Body Dressing/Undressing Upper body dressing   What is the patient wearing?: Pull over shirt    Upper body assist Assist Level: Minimal Assistance - Patient > 75%    Lower Body Dressing/Undressing Lower body dressing      What is the patient wearing?: Pants     Lower body assist Assist for lower body dressing: Minimal Assistance - Patient > 75%     Toileting Toileting    Toileting assist Assist for toileting: Minimal Assistance - Patient > 75%     Transfers Chair/bed transfer  Transfers assist      Chair/bed transfer assist level: Minimal Assistance - Patient > 75%     Locomotion Ambulation   Ambulation assist      Assist level: Minimal Assistance - Patient > 75% Assistive device: No Device Max distance: 300'   Walk 10 feet activity   Assist     Assist level: Minimal Assistance - Patient > 75% Assistive device: No Device   Walk 50 feet activity   Assist    Assist level: Minimal Assistance - Patient > 75% Assistive device: No Device    Walk 150 feet activity   Assist    Assist level: Minimal Assistance - Patient > 75% Assistive device: No Device    Walk 10 feet on uneven surface  activity   Assist     Assist level: Minimal Assistance - Patient > 75%  Wheelchair     Assist Is the patient using a wheelchair?: No             Wheelchair 50 feet with 2 turns activity    Assist            Wheelchair 150 feet activity     Assist          Blood pressure 130/87, pulse (!) 109, temperature 98.4 F (36.9 C), temperature source Oral, resp. rate 18, height 5\' 10"  (1.778 m), SpO2 100 %.   Medical Problem List and Plan:  1. 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  Antithrombotics: -DVT/anticoagulation:  Pharmaceutical: Lovenox 40mg  BID             -antiplatelet therapy: Aspirin 81 mg daily 3. Pain Management: Oxycodone as needed,   - Robaxin 1000 mg 3 times daily as needed added for right calf pain, spasm -01/06/23 reported HA to nursing, CT head neg yesterday, will start topamax 25mg  every day PRN for now 4. Mood/Behavior/Sleep: Klonopin 0.25 mg twice daily, Ritalin 10 mg twice daily, Inderal 20 mg 3 times daily             -antipsychotic agents: Seroquel 100 mg every morning and 200 mg nightly               - Sleep log 6/28              - veil bed for impulsivity, DC IV on admission d/t limiting lines/drains -01/06/23 agitated at times, wants to leave, but remains in bed and didn't become aggressive; keep enclosure bed in place for now; has ativan 1mg  q6h PRN for agitation   5. Neuropsych/cognition: This patient is not capable of making decisions on his own behalf.              - Rec neuropsych consult  - Patient easily redirectable for now, if attempts AMA he does not have capacity for decision-making.  Has Ativan as needed for anxiety, agitation (see above)  6. Skin/Wound Care: Routine skin checks 7. Fluids/Electrolytes/Nutrition/Dysphagia: Routine in and outs with follow-up chemistries--ordered weekly labs starting Monday 01/08/23              - Cortrak Dced 6/26  - eating 20-25% meals, encourage POs, continue supplements   8.  Acute hypoxic respiratory failure.  Patient extubated 6/10. 9.  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  Maintained on low-dose aspirin 10.  Alcohol intoxication.  Provide counseling.  Monitor for any signs of withdrawal. -May attempt to wean of clonazepam 0.25 mg twice daily early next week  11.  MRSA pneumonia.  Resolved.  Contact precautions-d/c'd.  Antibiotic therapy completed.   I spent >29mins performing patient care related activities, including face to face time, documentation time, med management, discussion of meds with patient and nursing staff, discussion of care plan with pt, and overall coordination of care.   LOS: 2 days A FACE TO FACE EVALUATION WAS PERFORMED  [address] 01/06/2023, 1:30 PM

## 2023-01-06 NOTE — Progress Notes (Signed)
Occupational Therapy Session Note  Patient Details  Name: Shawn Mills MRN: [id number] Date of Birth: Sep 11, 1980  Today's Date: 01/06/2023 OT Individual Time: 7829-5621, 245-330 OT Individual Time Calculation (min): 40 min ,   Short Term Goals: Week 1:  OT Short Term Goal 1 (Week 1): Patient will recall that he is in the hospital with min quetsioning cues. OT Short Term Goal 2 (Week 1): Patient will demonstrate improved safety awareness by sitting down to don pants with min questioning cues. OT Short Term Goal 3 (Week 1): Patient will maintain standing balance at the sink with CGA during functional BADL tasks.  Skilled Therapeutic Interventions/Progress Updates:   (1st) OT Treatment Session: Patient perseverating on going home at the time of treatment. Family present indicating he has been talking about going home all day. Patient indicated that he rest well last night. The pt had no report of pain at the time of treatment.  The pt was able to ambulate to the sink area and wash his face and hair with a face towel with close S.  The pt went on to complete a shaving exercise in standing with close S.  The pt ended the session with completing UB exercises 2 sets of 10 for shld flex, horizontal abduction, and elbow extension using medium grade theraband. At the end of the session, the pt returned to bed LOF with family present and all additional needs addressed.   (2nd) OT Treatment Session: Patient completed a shower for his second OT treatment session. The pt ambulated to the shower with close S and was able to remove his UB/LB clothing items with close S. The pt was able to bathe his UB/LB in the shower with close S. The pt was able to exit the shower and dry off with close S The pt was able to donn his UB/LB clothing items with s/u assist. . The pt went on to apply deodorant , comb his hair, apply lotion, and brush his teeth with s/u assist. The pt went on to complete UB exercise for shld  flexion, horizontal abduction, shld rotation, and large circles. 2 sets of 10 with a 3lb dowel requiring 2 rest breaks.  The pt went on to complete a simple home making task  by changing his lining and making  bed. At the end of the session the call light and bed side table were both within reach, all additional needs were addressed prior to exiting the room.    Therapy Documentation Precautions:  Precautions Precautions: Fall Precaution Comments: enclosure bed Restrictions Weight Bearing Restrictions: No  Therapy/Group: Individual Therapy  [place]ona Mound 01/06/2023, 3:37 PM

## 2023-01-06 NOTE — Progress Notes (Signed)
Physical Therapy TBI Note  Patient Details  Name: Ruffin Berzins MRN: [id number] Date of Birth: [birth year]  Today's Date: 01/06/2023 PT Individual Time: 0800-0845 PT Individual Time Calculation (min): 45 min  and Today's Date: 01/06/2023 PT Missed Time: 30 Minutes Missed Time Reason: Patient fatigue  Short Term Goals: Week 1:  PT Short Term Goal 1 (Week 1): Pt will perform basic transfers with CGA consistently. PT Short Term Goal 2 (Week 1): Pt will ambulate x300' with CGA consistently. PT Short Term Goal 3 (Week 1): Pt safety awareness will improve, only requiring min cueing to remain on task during session.  Skilled Therapeutic Interventions/Progress Updates:     Pt received seated in posey bed and agrees to therapy. Reports pain in legs is much improved from yesterday and not bothering him today. Pt continues to perseverate on wanting to leave but is not nearly as profane to this therapist and is more easily redirectable than previous session. Pt requires cueing to don socks prior to OOB mobility and pt is able to don independently. Pt ambulates to dayroom with CGA and cues for navigation and attention to task. Pt participates in Wii bowling and golf activity to provide challenge to attention, coordination, balance, and performing large, dynamic movement in multiple planes, having to adjust to changing visual stimuli. PT provides occasional cues to attend to pertinent details, as well as cueing to improve coordination and fine motor skills with RUE. Pt requires multiple seated rest breaks during activity and becomes slightly frustrated when having difficulty performing. Pt ambulates away from activity without warning. PT attempts to redirect to other tasks but pt is not interested in participating any longer this session. PT able to direct pt back to room. Pt agreeable to return to posey bed but verbalizes that he expects to "go home" later this day. Pt left in posey bed with call bell in reach.  Pt misses 30 minutes of skilled PT.   Therapy Documentation Precautions:  Precautions Precautions: Fall Precaution Comments: enclosure bed Restrictions Weight Bearing Restrictions: No  Agitated Behavior Scale: TBI Observation Details Observation Environment: CIR Start of observation period - Date: 01/06/23 Start of observation period - Time: 0800 End of observation period - Date: 01/06/23 End of observation period - Time: 0845 Agitated Behavior Scale (DO NOT LEAVE BLANKS) Short attention span, easy distractibility, inability to concentrate: Present to a slight degree Impulsive, impatient, low tolerance for pain or frustration: Present to a slight degree Uncooperative, resistant to care, demanding: Present to a slight degree Violent and/or threatening violence toward people or property: Absent Explosive and/or unpredictable anger: Absent Rocking, rubbing, moaning, or other self-stimulating behavior: Absent Pulling at tubes, restraints, etc.: Absent Wandering from treatment areas: Present to a slight degree Restlessness, pacing, excessive movement: Absent Repetitive behaviors, motor, and/or verbal: Present to a slight degree Rapid, loud, or excessive talking: Present to a slight degree Sudden changes of mood: Present to a slight degree Easily initiated or excessive crying and/or laughter: Absent Self-abusiveness, physical and/or verbal: Absent Agitated behavior scale total score: 21    Therapy/Group: Individual Therapy  Beau Fanny, PT, DPT 01/06/2023, 10:51 AM

## 2023-01-07 DIAGNOSIS — K5901 Slow transit constipation: Secondary | ICD-10-CM

## 2023-01-07 MED ORDER — DOCUSATE SODIUM 100 MG PO CAPS: 100.0000 mg | ORAL_CAPSULE | Freq: Two times a day (BID) | ORAL | Status: DC | PRN

## 2023-01-07 MED ORDER — SORBITOL 70 % SOLN: 30.0000 mL | Freq: Every day | Status: DC | PRN

## 2023-01-07 MED ORDER — POLYETHYLENE GLYCOL 3350 17 G PO PACK: 17.0000 g | PACK | Freq: Every day | ORAL | Status: DC | PRN

## 2023-01-07 NOTE — IPOC Note (Signed)
Overall Plan of Care [facility]) Patient Details Name: Shawn Mills MRN: [id number] DOB: 06-Jul-1981  Admitting Diagnosis: TBI (traumatic brain injury) [facility]  Hospital Problems: Principal Problem:   TBI (traumatic brain injury) (HCC)     Functional Problem List: Nursing Behavior, Bowel, Endurance, Medication Management, Pain, Safety  PT Balance, Behavior, Endurance, Pain, Motor, Perception, Safety  OT Balance, Behavior, Cognition, Endurance, Motor, Pain, Perception, Safety, Vision  SLP Behavior, Cognition, Linguistic, Perception, Safety  TR         Basic ADL's: OT Eating, Grooming, Dressing, Bathing, Toileting     Advanced  ADL's: OT       Transfers: PT Bed Mobility, Bed to Chair, Car, [employer], Floor  OT Toilet, Tub/Shower     Locomotion: PT Ambulation, Stairs     Additional Impairments: OT Fuctional Use of Upper Extremity  SLP Social Cognition, Communication comprehension, expression Social Interaction, Problem Solving, Memory, Attention, Awareness  TR      Anticipated Outcomes Item Anticipated Outcome  Self Feeding Mod I  Swallowing      Basic self-care  Supervision  Toileting  Supervision   Bathroom Transfers Supervision  Bowel/Bladder  manage bowel w mod I  Transfers  supervision  Locomotion  supervision  Communication  Min A  Cognition  Min A  Pain  < 4 with prns  Safety/Judgment  manage w cues   Therapy Plan: PT Intensity: Minimum of 1-2 x/day ,45 to 90 minutes PT Frequency: 5 out of 7 days PT Duration Estimated Length of Stay: 2-2.5 weeks OT Intensity: Minimum of 1-2 x/day, 45 to 90 minutes OT Frequency: 5 out of 7 days OT Duration/Estimated Length of Stay: 2 weeks SLP Intensity: Minumum of 1-2 x/day, 30 to 90 minutes SLP Frequency: 3 to 5 out of 7 days SLP Duration/Estimated Length of Stay: 20-30 days   Team Interventions: Nursing Interventions Patient/Family Education, Bowel Management, Disease Management/Prevention, Pain  Management, Medication Management, Cognitive Remediation/Compensation, Discharge Planning, Psychosocial Support  PT interventions Ambulation/gait training, Community reintegration, DME/adaptive equipment instruction, Neuromuscular re-education, Psychosocial support, Stair training, UE/LE Strength taining/ROM, Warden/ranger, Discharge planning, Functional electrical stimulation, Pain management, Skin care/wound management, UE/LE Coordination activities, Therapeutic Activities, Disease management/prevention, Cognitive remediation/compensation, Functional mobility training, Patient/family education, Splinting/orthotics, Therapeutic Exercise, Visual/perceptual remediation/compensation  OT Interventions Community reintegration, Discharge planning, Disease mangement/prevention, DME/adaptive equipment instruction, Functional electrical stimulation, Functional mobility training, Neuromuscular re-education, Pain management, Patient/family education, Psychosocial support, Self Care/advanced ADL retraining, Skin care/wound managment, Splinting/orthotics, Therapeutic Activities, Therapeutic Exercise, UE/LE Strength taining/ROM, UE/LE Coordination activities, Visual/perceptual remediation/compensation, Wheelchair propulsion/positioning, Cognitive remediation/compensation, Warden/ranger  SLP Interventions Cognitive remediation/compensation, Environmental controls, Speech/Language facilitation, Financial trader, Functional tasks, Therapeutic Activities, Patient/family education  TR Interventions    SW/CM Interventions Discharge Planning, Psychosocial Support, Patient/Family Education   Barriers to Discharge MD  Medical stability, Home enviroment access/loayout, Lack of/limited family support, Weight bearing restrictions, Behavior, and TBI- impaired cognition  Nursing Decreased caregiver support, Home environment access/layout, Behavior 1 level/level entry w Mom and S.O. assisting with care   PT Behavior    OT      SLP      SW Decreased caregiver support, Lack of/limited family support, Community education officer for SNF coverage     Team Discharge Planning: Destination: PT-Home ,OT- Home , SLP-Home Projected Follow-up: PT-24 hour supervision/assistance, Outpatient PT, OT-  Outpatient OT, SLP-Outpatient SLP Projected Equipment Needs: PT-To be determined, OT- To be determined, SLP-None recommended by SLP Equipment Details: PT- , OT-  Patient/family involved in discharge planning: PT- Patient unable/family or caregiver not available,  OT-Patient, SLP-Family member/caregiver, Patient  MD ELOS: 3-4 weeks Medical Rehab Prognosis:  Good Assessment: The patient has been admitted for CIR therapies with the diagnosis of TBI. The team will be addressing functional mobility, strength, stamina, balance, safety, adaptive techniques and equipment, self-care, bowel and bladder mgt, patient and caregiver education, . Goals have been set at supervision to min A. Anticipated discharge destination is home.        See Team Conference Notes for weekly updates to the plan of care

## 2023-01-07 NOTE — Progress Notes (Signed)
PROGRESS NOTE   Subjective/Complaints:  Pt doing ok today but still maintains that he needs to leave. Was found in a w/c going down the hallway by himself this morning-- had been with nursing staff but wandered off, found by provider and brought back to his room.  Perseverates on leaving, states he has a ride coming.  Slept ok, and denies pain anywhere. Doesn't mention any headaches or calf pain or anything he has mentioned to others before.  States he had a BM today but per reports, his LBM was 01/05/23. Urinating well. Denies any other complaints or concerns today.    ROS:  + R calf pain Limited due to cognition/perseveration, but denies CP, SOB, abd pain, n/v/d/c.     Objective:   CT HEAD WO CONTRAST ( )  Result Date: 01/05/2023 CLINICAL DATA:  Headache, sudden, severe Headache, increasing frequency or severity new onset nausea EXAM: CT HEAD WITHOUT CONTRAST TECHNIQUE: Contiguous axial images were obtained from the base of the skull through the vertex without intravenous contrast. RADIATION DOSE REDUCTION: This exam was performed according to the departmental dose-optimization program which includes automated exposure control, adjustment of the mA and/or kV according to patient size and/or use of iterative reconstruction technique. COMPARISON:  CT head 12/08/2022 FINDINGS: Brain: Left frontal lobe encephalomalacia. Subacute 8 mm left posterior subdural hematoma. No evidence of large-territorial acute infarction. No parenchymal hemorrhage. No mass lesion. No extra-axial collection. No mass effect or midline shift. No hydrocephalus. Basilar cisterns are patent. Vascular: No hyperdense vessel. Skull: No acute fracture or focal lesion. Sinuses/Orbits: Complete opacification of the right maxillary. Otherwise paranasal sinuses. Bilateral partial mastoid air cell effusions. The orbits are unremarkable. Other: None. IMPRESSION: 1. Subacute 8 mm  left posterior subdural hematoma. No findings to suggest superimposed acute bleed. 2. No acute intracranial abnormality. 3. Bilateral partial mastoid air cell effusions. Electronically Signed   By: Tish Frederickson M.D.   On: 01/05/2023 18:16   Recent Labs    01/04/23 1931 01/05/23 0607  WBC 8.1 10.0  HGB 12.6* 13.3  HCT 36.5* 38.4*  PLT 660* 630*   Recent Labs    01/04/23 1931 01/05/23 0607  NA  --  136  K  --  3.7  CL  --  102  CO2  --  26  GLUCOSE  --  95  BUN  --  5*  CREATININE 0.76 0.74  CALCIUM  --  9.2    Intake/Output Summary (Last 24 hours) at 01/07/2023 1259 Last data filed at 01/07/2023 0732 Gross per 24 hour  Intake 120 ml  Output --  Net 120 ml        Physical Exam: Vital Signs Blood pressure 133/69, pulse 94, temperature 98.2 F (36.8 C), temperature source Oral, resp. rate 18, height 5\' 10"  (1.778 m), SpO2 97 %. Constitutional: No acute distress and less agitated than yesterday. Appropriate appearance for age. Initially found in w/c, then later sitting in enclosure bed HENT: Atraumatic, normocephalic.  Resolving bruising.  Cardiovascular: RRR, nl s1/s2, no m/r/g, distal pulses intact, no pedal edema  Respiratory: CTAB in all lung fields, no w/r/r, no hypoxia or increased WOB, speaking in full sentences Abdomen: Soft, NTND, +  BS throughout, no r/g/r Skin: C/D/I. No apparent lesions Neuro: perseverates on leaving the hospital, more cooperative today but still asking to leave.    PRIOR EXAMS: Eyes: PERRLA. EOMI, some mild nystagmus with right greater than left gaze. MSK:      No apparent deformity. +TTP R calf, plantar foot      Strength: All 4 limbs antigravity and against resistance, 5 out of 5   Neurologic exam:  Cognition: AAO to person, "here"/"hospital", and June.  Not oriented to day or place further with cues.  Can follow simple one-step commands  + Perseveration, poor attention, poor comprehension   Language: Fluent, multiple  substitutions/neoglisms.  Can name 1 of 3 items correctly. no dysarthria.  Intact repetition. Memory: Recalls 1/3 objects at 5 minutes.   Insight: Poor insight into current condition.  Mood: Elevated/manic affect, appropriate mood.   Sensation: To light touch intact in BL UEs and LEs  Reflexes: 2+ in BL UE and LEs. Negative Hoffman's and babinski signs bilaterally.  CN: Right facial droop.  Mild right hearing deficit. Coordination: No apparent tremors. No ataxia on FTN, HTS bilaterally.  Spasticity: MAS 0 in all extremities.    Assessment/Plan: 1. Functional deficits which require 3+ hours per day of interdisciplinary therapy in a comprehensive inpatient rehab setting. Physiatrist is providing close team supervision and 24 hour management of active medical problems listed below. Physiatrist and rehab team continue to assess barriers to discharge/monitor patient progress toward functional and medical goals  Care Tool:  Bathing    Body parts bathed by patient: Right arm, Left arm, Chest, Abdomen, Front perineal area, Buttocks, Right upper leg, Left upper leg, Face, Left lower leg, Right lower leg         Bathing assist Assist Level: Minimal Assistance - Patient > 75%     Upper Body Dressing/Undressing Upper body dressing   What is the patient wearing?: Pull over shirt    Upper body assist Assist Level: Minimal Assistance - Patient > 75%    Lower Body Dressing/Undressing Lower body dressing      What is the patient wearing?: Pants     Lower body assist Assist for lower body dressing: Minimal Assistance - Patient > 75%     Toileting Toileting    Toileting assist Assist for toileting: Minimal Assistance - Patient > 75%     Transfers Chair/bed transfer  Transfers assist     Chair/bed transfer assist level: Minimal Assistance - Patient > 75%     Locomotion Ambulation   Ambulation assist      Assist level: Minimal Assistance - Patient > 75% Assistive  device: No Device Max distance: 300'   Walk 10 feet activity   Assist     Assist level: Minimal Assistance - Patient > 75% Assistive device: No Device   Walk 50 feet activity   Assist    Assist level: Minimal Assistance - Patient > 75% Assistive device: No Device    Walk 150 feet activity   Assist    Assist level: Minimal Assistance - Patient > 75% Assistive device: No Device    Walk 10 feet on uneven surface  activity   Assist     Assist level: Minimal Assistance - Patient > 75%     Wheelchair     Assist Is the patient using a wheelchair?: No             Wheelchair 50 feet with 2 turns activity    Assist  Wheelchair 150 feet activity     Assist          Blood pressure 133/69, pulse 94, temperature 98.2 F (36.8 C), temperature source Oral, resp. rate 18, height 5\' 10"  (1.778 m), SpO2 97 %.   Medical Problem List and Plan:  1. 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  Antithrombotics: -DVT/anticoagulation:  Pharmaceutical: Lovenox 40mg  BID             -antiplatelet therapy: Aspirin 81 mg daily 3. Pain Management: Oxycodone as needed,   - Robaxin 1000 mg 3 times daily as needed added for right calf pain, spasm -01/06/23 reported HA to nursing, CT head neg yesterday, will start topamax 25mg  every day PRN for now -01/07/23 no complaints today, cont regimen 4. Mood/Behavior/Sleep: Klonopin 0.25 mg twice daily, Ritalin 10 mg twice daily, Inderal 20 mg 3 times daily             -antipsychotic agents: Seroquel 100 mg every morning and 200 mg nightly              - Sleep log 6/28              - veil bed for impulsivity, DC IV on admission d/t limiting lines/drains -01/06/23 agitated at times, wants to  leave, but remains in bed and didn't become aggressive; keep enclosure bed in place for now; has ativan 1mg  q6h PRN for agitation -01/07/23 less agitated but found in hallway unattended, advised nursing staff that he needs to be monitored closer, don't think he's ready to be released from vail bed yet.    5. Neuropsych/cognition: This patient is not capable of making decisions on his own behalf.              - Rec neuropsych consult  - Patient easily redirectable for now, if attempts AMA he does not have capacity for decision-making.  Has Ativan as needed for anxiety, agitation (see above)  6. Skin/Wound Care: Routine skin checks 7. Fluids/Electrolytes/Nutrition/Dysphagia: Routine in and outs with follow-up chemistries--ordered weekly labs starting Monday 01/08/23              - Cortrak Dced 6/26  - eating 20-25% meals, encourage POs, continue supplements   8.  Acute hypoxic respiratory failure.  Patient extubated 6/10. 9.  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  Maintained on low-dose aspirin 10.  Alcohol intoxication.  Provide counseling.  Monitor for any signs of withdrawal. -May attempt to wean of clonazepam 0.25 mg twice daily early next week  11.  MRSA pneumonia.  Resolved.  Contact precautions-d/c'd.  Antibiotic therapy completed.  12. Constipation: currently not on any bowel meds -01/07/23 no BM since 01/05/23, if no BM by tomorrow, may need to adjust regimen. Will add some PRNs (miralax, colace, sorbitol)   I spent >43mins performing patient care related activities, including face to face time, documentation time, med management, discussion of care with patient and nursing staff, and overall coordination of care.   LOS: 3 days A FACE TO FACE EVALUATION WAS PERFORMED  [address] 01/07/2023, 12:59 PM

## 2023-01-08 LAB — CBC
HCT: 40 % (ref 39.0–52.0)
Hemoglobin: 13.7 g/dL (ref 13.0–17.0)
MCH: 33.6 pg (ref 26.0–34.0)
MCHC: 34.3 g/dL (ref 30.0–36.0)
MCV: 98 fL (ref 80.0–100.0)
Platelets: 514 10*3/uL — ABNORMAL HIGH (ref 150–400)
RBC: 4.08 MIL/uL — ABNORMAL LOW (ref 4.22–5.81)
RDW: 12.5 % (ref 11.5–15.5)
WBC: 9.6 10*3/uL (ref 4.0–10.5)
nRBC: 0 % (ref 0.0–0.2)

## 2023-01-08 LAB — BASIC METABOLIC PANEL
Anion gap: 11 (ref 5–15)
BUN: 5 mg/dL — ABNORMAL LOW (ref 6–20)
CO2: 26 mmol/L (ref 22–32)
Calcium: 9.2 mg/dL (ref 8.9–10.3)
Chloride: 101 mmol/L (ref 98–111)
Creatinine, Ser: 0.89 mg/dL (ref 0.61–1.24)
GFR, Estimated: >60 mL/min (ref >60)
Glucose, Bld: 90 mg/dL (ref 70–99)
Potassium: 3.9 mmol/L (ref 3.5–5.1)
Sodium: 138 mmol/L (ref 135–145)

## 2023-01-08 MED ORDER — METHYLPHENIDATE HCL 5 MG PO TABS
5.0000 mg | ORAL_TABLET | Freq: Two times a day (BID) | ORAL | Status: DC
  Administered 2023-01-08 – 2023-01-09 (×2): 5 mg via ORAL
  Filled 2023-01-08 (×2): qty 1

## 2023-01-08 MED ORDER — CLONAZEPAM 0.125 MG PO TBDP
0.1250 mg | ORAL_TABLET | Freq: Two times a day (BID) | ORAL | Status: DC
  Administered 2023-01-08 – 2023-01-09 (×2): 0.125 mg via ORAL
  Filled 2023-01-08 (×2): qty 1

## 2023-01-08 MED ORDER — DICLOFENAC SODIUM 1 % EX GEL
4.0000 g | Freq: Two times a day (BID) | CUTANEOUS | Status: DC
  Administered 2023-01-08 – 2023-01-22 (×25): 4 g via TOPICAL
  Filled 2023-01-08: qty 100

## 2023-01-08 MED ORDER — TOPIRAMATE 25 MG PO TABS
25.0000 mg | ORAL_TABLET | Freq: Two times a day (BID) | ORAL | Status: DC
  Administered 2023-01-08 – 2023-01-12 (×9): 25 mg via ORAL
  Filled 2023-01-08 (×9): qty 1

## 2023-01-08 NOTE — Care Management (Signed)
[facility] Individual Statement of Services  Patient Name:  Shawn Mills  Date:  01/08/2023  Welcome to the [facility].  Our goal is to provide you with an individualized program based on your diagnosis and situation, designed to meet your specific needs.  With this comprehensive rehabilitation program, you will be expected to participate in at least 3 hours of rehabilitation therapies Monday-Friday, with modified therapy programming on the weekends.  Your rehabilitation program will include the following services:  Physical Therapy (PT), Occupational Therapy (OT), Speech Therapy (ST), 24 hour per day rehabilitation nursing, Therapeutic Recreaction (TR), Psychology, Neuropsychology, Care Coordinator, Rehabilitation Medicine, Nutrition Services, Pharmacy Services, and Other  Weekly team conferences will be held on Tuesdays to discuss your progress.  Your Inpatient Rehabilitation Care Coordinator will talk with you frequently to get your input and to update you on team discussions.  Team conferences with you and your family in attendance may also be held.  Expected length of stay: 20-30 days    Overall anticipated outcome: Supervision  Depending on your progress and recovery, your program may change. Your Inpatient Rehabilitation Care Coordinator will coordinate services and will keep you informed of any changes. Your Inpatient Rehabilitation Care Coordinator's name and contact numbers are listed  below.  The following services may also be recommended but are not provided by the [facility]:  Driving Evaluations Home Health Rehabiltiation Services Outpatient Rehabilitation Services Vocational Rehabilitation   Arrangements will be made to provide these services after discharge if needed.  Arrangements include referral to agencies that provide these services.  Your insurance has been verified to be:  [employer]  Your primary  doctor is:  No PCP listed  Pertinent information will be shared with your doctor and your insurance company.  Inpatient Rehabilitation Care Coordinator:  Susie Cassette [phone] or ([phone]  Information discussed with and copy given to patient by: Gretchen Short, 01/08/2023, 1:50 PM

## 2023-01-08 NOTE — Progress Notes (Signed)
Met with wife and patient. Patient sitting in recliner. Reports that he is leaving.  Pulled schedule for today for wife. Discussed team conference on every Tuesday and that SW will follow up with meeting information.  Discussed education binder briefly.  All needs met. Currently behavior plan on bathroom door in room and at charge desk.

## 2023-01-08 NOTE — Progress Notes (Signed)
Patient ID: Shawn Mills, male   DOB: [birth year], 42 y.o.   MRN: [id number]  SW left message for pt s/o Melissa to inform on ELOS. SW will provide updates after team conference.  Cecile Sheerer, MSW, LCSWA Office: [phone] Cell: [phone] Fax: [phone]

## 2023-01-08 NOTE — Progress Notes (Signed)
Occupational Therapy TBI Note  Patient Details  Name: Shawn Mills MRN: [id number] Date of Birth: 07/05/1981  Today's Date: 01/08/2023 OT Individual Time: 1100-1155 OT Individual Time Calculation (min): 55 min    Short Term Goals: Week 1:  OT Short Term Goal 1 (Week 1): Patient will recall that he is in the hospital with min quetsioning cues. OT Short Term Goal 2 (Week 1): Patient will demonstrate improved safety awareness by sitting down to don pants with min questioning cues. OT Short Term Goal 3 (Week 1): Patient will maintain standing balance at the sink with CGA during functional BADL tasks.  Skilled Therapeutic Interventions/Progress Updates:    Pt received sitting in the recliner in his room with his SO Melissa present. He was perseverative on going home throughout session, ABS up to 30 but overall redirectable. He was frequently quite irritable with OT, cursing occasionally but not inappropriate. He completed functional mobility around room to prepare for shower with CGA- (S). He doffed his clothing, with a posterior LOB during single leg stance, requiring min A to recover. Poor awareness of deficits. He completed bathing seated/standing, requiring OT cueing to remain seated and to not turn around in the shower. Bathing completed with good automatic sequencing/thoroughness. He dressed following with close (S). He completed functional mobility to the therapy gym with (S), cueing required for selective attention to task. He completed dynamic balance task using the agility ladder while attempting functional animal naming task. He demonstrated poor dual task processing and required frequent redirection to task and max cueing overall to name 2/10 letters. He became increasingly frustrated with perseveration on going home and OT redirection. Pt oriented to place and general season of year (not time). He returned to his room. Pt was left sitting up in the recliner with all needs met, chair alarm  set, and call bell within reach. Notified SO to alert nursing staff when she's leaving.    Therapy Documentation Precautions:  Precautions Precautions: Fall Precaution Comments: enclosure bed Restrictions Weight Bearing Restrictions: No    Agitated Behavior Scale: TBI Observation Details Observation Environment: CIR Start of observation period - Date: 01/08/23 Start of observation period - Time: 1100 End of observation period - Date: 01/08/23 End of observation period - Time: 1200 Agitated Behavior Scale (DO NOT LEAVE BLANKS) Short attention span, easy distractibility, inability to concentrate: Present to a moderate degree Impulsive, impatient, low tolerance for pain or frustration: Present to a moderate degree Uncooperative, resistant to care, demanding: Present to a moderate degree Violent and/or threatening violence toward people or property: Absent Explosive and/or unpredictable anger: Present to a slight degree Rocking, rubbing, moaning, or other self-stimulating behavior: Absent Pulling at tubes, restraints, etc.: Absent Wandering from treatment areas: Present to a slight degree Restlessness, pacing, excessive movement: Present to a moderate degree Repetitive behaviors, motor, and/or verbal: Present to a moderate degree Rapid, loud, or excessive talking: Present to a moderate degree Sudden changes of mood: Present to a moderate degree Easily initiated or excessive crying and/or laughter: Absent Self-abusiveness, physical and/or verbal: Absent Agitated behavior scale total score: 30   Therapy/Group: Individual Therapy  Crissie Reese 01/08/2023, 1:23 PM

## 2023-01-08 NOTE — Progress Notes (Signed)
Physical Therapy Session Note  Patient Details  Name: Shawn Mills MRN: [id number] Date of Birth: [birth year]  Today's Date: 01/08/2023 PT Individual Time: 4098-1191 PT Individual Time Calculation (min): 55 min  and Today's Date: 01/08/2023 PT Missed Time: 45 Minutes Missed Time Reason: Patient unwilling to participate  Short Term Goals: Week 1:  PT Short Term Goal 1 (Week 1): Pt will perform basic transfers with CGA consistently. PT Short Term Goal 2 (Week 1): Pt will ambulate x300' with CGA consistently. PT Short Term Goal 3 (Week 1): Pt safety awareness will improve, only requiring min cueing to remain on task during session.  Skilled Therapeutic Interventions/Progress Updates:     1st Session: Pt received seated in posey bed and agrees to therapy. Reports some pain in RLE. Number not provided. PT provides rest breaks as needed to manage pain. Pt performs sit to stand with CGA and cues for initiation. Pt ambulates throughout session with close supervision due to lack of safety awareness and increased postural sway, but no overt LOBs. Pt participates in [employer] activity to challenge dynamic standing balance and attention to task. Activity involves pt shifting center of mass over randomly moving target. Pt initially performs without upper extremity support but with fatigue requires bilateral upper extremities. Pt is able to complete for 6:00 with 80% accuracy, verbalizing fatigue in Rt leg following activity. Seated rest break. Pt then performs high level ambulation training, tasked with naming items from supermarket with each letter of the alphabet while ambulating without AD. Pt requires frequent cues to remain on task and for accurate completing, but gait speed and pattern does not substantially change with addition of cognitive task.   Pt completes Nustep for endurance training, as well as challenging attention to task. Pt completes x8:00 at workload of 6 with average steps per minute ~40. Pt  requires several brief seated rest break. PT redirects pt to task several times and provides cues for hand and foot placement and completing full available ROM.   Pt ambulates back to room and requires encouragement to return to veil bed, as pt attempts to convince therapist to allow him to stay up in recliner. Pt is eventually agreeable following education on safety. Left in veil bed with call bell in reach.   2nd Session: Pt received seated in recliner. PT greets pt and pt perseverates on wanting to leave. Pt becomes agitated and verbally profane, and PT is unable to redirect to appropriate tasks, so session deferred. Pt misses 45 minutes of skilled PT.   Therapy Documentation Precautions:  Precautions Precautions: Fall Precaution Comments: enclosure bed Restrictions Weight Bearing Restrictions: No    Therapy/Group: Individual Therapy  Beau Fanny, PT, DPT 01/08/2023, 4:42 PM

## 2023-01-08 NOTE — Progress Notes (Signed)
Speech Language Pathology TBI Note  Patient Details  Name: Shawn Mills MRN: [id number] Date of Birth: 1981/07/07  Today's Date: 01/08/2023 SLP Individual Time: 0728-0815 SLP Individual Time Calculation (min): 47 min  Short Term Goals: Week 1: SLP Short Term Goal 1 (Week 1): Pt will utilize external aids to answer orientation questions with >80% accuracy. SLP Short Term Goal 2 (Week 1): Pt will complete functional safety awareness tasks wtih >50% accuracy given min cues. SLP Short Term Goal 3 (Week 1): Pt will sustain attention during structured task for >5 minutes with min cues. SLP Short Term Goal 4 (Week 1): Pt will utilize compensatory word finding strategies in structured tasks with >75% accuracy. SLP Short Term Goal 5 (Week 1): Pt will follow 1-2 step auditory commands with >80% accuracy.  Skilled Therapeutic Interventions: Skilled treatment session focused on cognitive goals. Upon arrival, patient was awake while upright in the enclosure bed. Patient declined his breakfast tray but drank ~20 oz throughout the session. Patient with language of confusion throughout session but was independently oriented to situation with total A needed for orientation to place and date. SLP provided external aids to maximize recall and carryover of information which he was able to utilize later in the session with Mod verbal cues. Patient participated in a basic money management task with Min verbal cues needed for problem solving and Mod verbal cues needed for selective attention to task in a mildly distracting environment for ~20 minutes. Patient requested to call his girlfriend and ambulated to the RN station to call with supervision verbal cues needed for problem solving throughout task. Patient left a message but requested to call his girlfriend again ~2 minutes later from his room phone despite already being told that it cannot call long distance numbers. Overall, patient remained calm and cooperative  throughout session. Patient left secured in enclosure bed with all needs within reach. Continue with current plan of care.      Pain No/Denies Pain   Agitated Behavior Scale: TBI Observation Details Observation Environment: CIR Start of observation period - Date: 01/08/23 Start of observation period - Time: 0728 End of observation period - Date: 01/08/23 End of observation period - Time: 0815 Agitated Behavior Scale (DO NOT LEAVE BLANKS) Short attention span, easy distractibility, inability to concentrate: Present to a slight degree Impulsive, impatient, low tolerance for pain or frustration: Present to a slight degree Uncooperative, resistant to care, demanding: Absent Violent and/or threatening violence toward people or property: Absent Explosive and/or unpredictable anger: Absent Rocking, rubbing, moaning, or other self-stimulating behavior: Absent Pulling at tubes, restraints, etc.: Absent Wandering from treatment areas: Absent Restlessness, pacing, excessive movement: Present to a slight degree Repetitive behaviors, motor, and/or verbal: Present to a slight degree Rapid, loud, or excessive talking: Present to a slight degree Sudden changes of mood: Absent Easily initiated or excessive crying and/or laughter: Absent Self-abusiveness, physical and/or verbal: Absent Agitated behavior scale total score: 19  Therapy/Group: Individual Therapy  Mariska Daffin 01/08/2023, 8:22 AM

## 2023-01-08 NOTE — Progress Notes (Signed)
PROGRESS NOTE   Subjective/Complaints:  Informed by therapies the patient is saying he is going home at the end of today.  He has been somewhat restless, difficult to redirect back into veil bed, but not agitated or violent.  On exam, patient states "I want to let you know that I am going to make, my girlfriend is coming to pick me up".  He states that they are going to [place], he is going to be living with his boss and doing some home-improvement work with him.  Patient is unable to provide further details.  When asked about intermittent severe headaches and nausea reported over this weekend, he states "I had that a few weeks ago, it is totally gone away".  He denies any current pain, discomfort.  ROS:  + Headaches + Restlessness Limited due to cognition/perseveration, but denies CP, SOB, abd pain, n/v/d/c.     Objective:   No results found. Recent Labs    01/08/23 0610  WBC 9.6  HGB 13.7  HCT 40.0  PLT 514*    Recent Labs    01/08/23 0610  NA 138  K 3.9  CL 101  CO2 26  GLUCOSE 90  BUN 5*  CREATININE 0.89  CALCIUM 9.2     Intake/Output Summary (Last 24 hours) at 01/08/2023 0826 Last data filed at 01/07/2023 1649 Gross per 24 hour  Intake 480 ml  Output --  Net 480 ml         Physical Exam: Vital Signs Blood pressure 115/76, pulse 91, temperature 98.5 F (36.9 C), temperature source Oral, resp. rate 18, height 5\' 10"  (1.778 m), SpO2 99 %. Constitutional: No acute distress. Appropriate appearance for age.  Sitting upright enclosure bed, initially attempts to get out but is redirectable back into bed.   HENT: Atraumatic, normocephalic.  Cardiovascular: RRR, nl s1/s2, no m/r/g, distal pulses intact, no pedal edema  Respiratory: CTAB in all lung fields, no w/r/r, no hypoxia or increased WOB, speaking in full sentences Abdomen: Soft, NTND, +BS throughout, no r/g/r Skin: C/D/I. No apparent lesions Eyes:  PERRLA. EOMI, some mild nystagmus with right greater than left gaze- -not appreciated in neutral gaze MSK:      No apparent deformity.       Strength: All 4 limbs antigravity and against resistance, 5 out of 5   Neurologic exam:  Cognition: AAO to person, "here"/"hospital" and "[facility]", and "1" "2024".  Fixates on months being "5" despite cueing that it is a new month, and that the last month was June.  Can follow simple one-step commands  + Perseveration, poor attention, poor comprehension + Tangential   Language: Fluent, multiple substitutions/neoglisms.  Insight: Poor insight into current condition.  Mood: Elevated/manic affect, appropriate mood.   Sensation: To light touch intact in BL UEs and LEs  CN: Right facial droop.  Mild right hearing deficit. Coordination: No apparent tremors. No ataxia on FTN, HTS bilaterally.  Spasticity: MAS 0 in all extremities.    Assessment/Plan: 1. Functional deficits which require 3+ hours per day of interdisciplinary therapy in a comprehensive inpatient rehab setting. Physiatrist is providing close team supervision and 24 hour management of active medical problems listed  below. Physiatrist and rehab team continue to assess barriers to discharge/monitor patient progress toward functional and medical goals  Care Tool:  Bathing    Body parts bathed by patient: Right arm, Left arm, Chest, Abdomen, Front perineal area, Buttocks, Right upper leg, Left upper leg, Face, Left lower leg, Right lower leg         Bathing assist Assist Level: Minimal Assistance - Patient > 75%     Upper Body Dressing/Undressing Upper body dressing   What is the patient wearing?: Pull over shirt    Upper body assist Assist Level: Minimal Assistance - Patient > 75%    Lower Body Dressing/Undressing Lower body dressing      What is the patient wearing?: Pants     Lower body assist Assist for lower body dressing: Minimal Assistance - Patient > 75%      Toileting Toileting    Toileting assist Assist for toileting: Independent     Transfers Chair/bed transfer  Transfers assist     Chair/bed transfer assist level: Minimal Assistance - Patient > 75%     Locomotion Ambulation   Ambulation assist      Assist level: Minimal Assistance - Patient > 75% Assistive device: No Device Max distance: 300'   Walk 10 feet activity   Assist     Assist level: Minimal Assistance - Patient > 75% Assistive device: No Device   Walk 50 feet activity   Assist    Assist level: Minimal Assistance - Patient > 75% Assistive device: No Device    Walk 150 feet activity   Assist    Assist level: Minimal Assistance - Patient > 75% Assistive device: No Device    Walk 10 feet on uneven surface  activity   Assist     Assist level: Minimal Assistance - Patient > 75%     Wheelchair     Assist Is the patient using a wheelchair?: No             Wheelchair 50 feet with 2 turns activity    Assist            Wheelchair 150 feet activity     Assist          Blood pressure 115/76, pulse 91, temperature 98.5 F (36.9 C), temperature source Oral, resp. rate 18, height 5\' 10"  (1.778 m), SpO2 99 %.   Medical Problem List and Plan:  1. 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  Antithrombotics: -DVT/anticoagulation:  Pharmaceutical: Lovenox 40mg  BID             -antiplatelet therapy: Aspirin 81 mg daily 3. Pain Management: Oxycodone as needed,   - Robaxin 1000 mg 3 times daily as needed added for right calf pain, spasm -01/06/23 reported HA to nursing, CT head neg yesterday, will start topamax 25mg  every day PRN for now -01/07/23 no complaints today, cont regimen -7-1: Scheduled  Topamax due to recurrent headaches, should also decrease agitation and help with mood stability  4. Mood/Behavior/Sleep: Klonopin 0.25 mg twice daily, Ritalin 10 mg twice daily, Inderal 20 mg 3 times daily             -antipsychotic agents: Seroquel 100  mg every morning and 200 mg nightly              - Sleep log 6/28              - veil bed for impulsivity, DC IV on admission d/t limiting lines/drains -01/06/23 agitated at times, wants to leave, but remains in bed and didn't become aggressive; keep enclosure bed in place for now; has ativan 1mg  q6h PRN for agitation -01/07/23 less agitated but found in hallway unattended, advised nursing staff that he needs to be monitored closer, don't think he's ready to be released from vail bed yet.  7-1: Continue veil bed.  Will inquire with nursing regarding sleep log.   5. Neuropsych/cognition: This patient is not capable of making decisions on his own behalf.              - Rec neuropsych consult  - Patient easily redirectable for now, if attempts AMA he does not have capacity for decision-making.  Has Ativan as needed for anxiety, agitation (see above) -7-1: Reduce standing Clonazepam to 0.125 mg BID; add standing Topomax 25 mg BID for mood and headaches; reduce Ritalin to 5 mg BID  6. Skin/Wound Care: Routine skin checks 7. Fluids/Electrolytes/Nutrition/Dysphagia: Routine in and outs with follow-up chemistries--ordered weekly labs starting Monday 01/08/23              - Cortrak Dced 6/26  - eating 20-25% meals, encourage POs, continue supplements   - eating 0-25% meals; intermittently unsafe to eat d/t aggitation - reduce ritalin as above; patient states that he just does not like the food here.  Also states that he was drinking "a lot" of alcohol throughout the day prior to hospital admission, did not eat much at baseline.  He is not redirectable to eating more.   8.  Acute hypoxic respiratory failure.  Patient extubated 6/10. 9.  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  Maintained on low-dose aspirin 10.  Alcohol intoxication.  Provide counseling.  Monitor for any signs of withdrawal. -May attempt to wean of clonazepam 0.25 mg twice daily early next week - getting PRN ativan often, continue for now  11.  MRSA pneumonia.  Resolved.  Contact precautions-d/c'd.  Antibiotic therapy completed.  12. Constipation: currently not on any bowel meds -01/07/23 no BM since 01/05/23, if no BM by tomorrow, may need to adjust regimen. Will add some PRNs (miralax, colace, sorbitol) - LBM 6/29     LOS: 4 days A FACE TO FACE EVALUATION WAS PERFORMED  Angelina Sheriff 01/08/2023, 8:26 AM

## 2023-01-09 ENCOUNTER — Encounter (HOSPITAL_COMMUNITY): Payer: Self-pay | Admitting: Physical Medicine and Rehabilitation

## 2023-01-09 MED ORDER — QUETIAPINE FUMARATE 50 MG PO TABS
100.0000 mg | ORAL_TABLET | Freq: Two times a day (BID) | ORAL | Status: DC
  Administered 2023-01-09 – 2023-01-12 (×6): 100 mg via ORAL
  Filled 2023-01-09 (×6): qty 2

## 2023-01-09 MED ORDER — TRAZODONE HCL 50 MG PO TABS
100.0000 mg | ORAL_TABLET | Freq: Every day | ORAL | Status: DC
  Administered 2023-01-09 – 2023-01-11 (×3): 100 mg via ORAL
  Filled 2023-01-09 (×3): qty 2

## 2023-01-09 MED ORDER — GABAPENTIN 300 MG PO CAPS
300.0000 mg | ORAL_CAPSULE | Freq: Three times a day (TID) | ORAL | Status: DC
  Administered 2023-01-09 – 2023-01-10 (×3): 300 mg via ORAL
  Filled 2023-01-09 (×3): qty 1

## 2023-01-09 MED ORDER — NICOTINE 14 MG/24HR TD PT24
14.0000 mg | MEDICATED_PATCH | Freq: Every day | TRANSDERMAL | Status: DC
  Administered 2023-01-09 – 2023-01-23 (×15): 14 mg via TRANSDERMAL
  Filled 2023-01-09 (×15): qty 1

## 2023-01-09 MED ORDER — CLONAZEPAM 0.25 MG PO TBDP
0.2500 mg | ORAL_TABLET | Freq: Three times a day (TID) | ORAL | Status: DC
  Administered 2023-01-09 – 2023-01-13 (×11): 0.25 mg via ORAL
  Filled 2023-01-09 (×11): qty 1

## 2023-01-09 NOTE — Progress Notes (Signed)
Physical Therapy TBI Note  Patient Details  Name: Shawn Mills MRN: [id number] Date of Birth: Dec 15, 1980  Today's Date: 01/09/2023 PT Individual Time: 1300-1345 PT Individual Time Calculation (min): 45 min   Short Term Goals: Week 1:  PT Short Term Goal 1 (Week 1): Pt will perform basic transfers with CGA consistently. PT Short Term Goal 2 (Week 1): Pt will ambulate x300' with CGA consistently. PT Short Term Goal 3 (Week 1): Pt safety awareness will improve, only requiring min cueing to remain on task during session.  Skilled Therapeutic Interventions/Progress Updates:     Pt received supine in veil bed and agrees to therapy. Reports pain in Rt knee and distal thigh. PT offers voltarin gel and pt refuses. PT provides rest breaks as needed to manage pain. Pt stands from bed with cues for initiation. Pt ambulates x150' to gym with close supervision and cues for navigation. Pt then tasked with reading items from sheet of paper and ambulating x175' then attempting to recall the 5 items from the paper. Pt is able to recall 2/5, with all 5 being brands of cars. Pt becomes frustrated and begins to perseverate on wanting to leave. PT able to redirect pt to task after several attempts. Pt then completes Functional Gait Assessment.  Pt score on FGA is 16/30, indicating high risk for falls. Pt ambulates back to room with close supervision and requires mod encouragement to get in veil bed. Pt left supine in bed with call bell in reach.   Therapy Documentation Precautions:  Precautions Precautions: Fall Precaution Comments: enclosure bed Restrictions Weight Bearing Restrictions: No Agitated Behavior Scale: TBI Observation Details Observation Environment: CIR Start of observation period - Date: 01/09/23 Start of observation period - Time: 1100 End of observation period - Date: 01/09/23 End of observation period - Time: 1200 Agitated Behavior Scale (DO NOT LEAVE BLANKS) Short attention span, easy  distractibility, inability to concentrate: Present to a moderate degree Impulsive, impatient, low tolerance for pain or frustration: Present to a moderate degree (3) Uncooperative, resistant to care, demanding: Present to a slight degree Violent and/or threatening violence toward people or property: Absent Explosive and/or unpredictable anger: Absent Rocking, rubbing, moaning, or other self-stimulating behavior: Absent Pulling at tubes, restraints, etc.: Absent Wandering from treatment areas: Present to a slight degree Restlessness, pacing, excessive movement: Present to a slight degree Repetitive behaviors, motor, and/or verbal: Present to a moderate degree Rapid, loud, or excessive talking: Present to a moderate degree Sudden changes of mood: Present to a moderate degree Easily initiated or excessive crying and/or laughter: Absent Self-abusiveness, physical and/or verbal: Absent Agitated behavior scale total score: 27    Therapy/Group: Individual Therapy  Beau Fanny, PT, DPT 01/09/2023, 4:40 PM

## 2023-01-09 NOTE — Progress Notes (Signed)
Occupational Therapy TBI Note  Patient Details  Name: Shawn Mills MRN: [id number] Date of Birth: 05-01-1981  Session 1 Today's Date: 01/09/2023 OT Individual Time: 1100-1200 OT Individual Time Calculation (min): 60 min    Session 2  Today's Date: 01/09/2023 OT Individual Time: 8657-8469 OT Individual Time Calculation (min): 40 min    Short Term Goals: Week 1:  OT Short Term Goal 1 (Week 1): Patient will recall that he is in the hospital with min quetsioning cues. OT Short Term Goal 2 (Week 1): Patient will demonstrate improved safety awareness by sitting down to don pants with min questioning cues. OT Short Term Goal 3 (Week 1): Patient will maintain standing balance at the sink with CGA during functional BADL tasks.  Skilled Therapeutic Interventions/Progress Updates:    Session 1 Pt received supine in the enclosure bed with no c/o pain. He was perseverative on going home but was overall redirectable to task. He completed shower at [facility]- (S) level overall, no shower chair use. He required min A for balance as he had one LOB when doffing LB clothing. Poor awareness of deficits. Pt completed UB/LB dressing with close (S), requiring mod cueing for safety awareness- to sit down to don pants/socks. He brushed his teeth with (S), however then applied shaving cream to toothbrush and brushed his beard. When questioned he was adamant that this was his intention but could not verbalize why. He completed functional mobility to the therapy gym with close (S). Pt completed BUE dumbbell therex with 10#. Completed to challenge BUE strength and endurance required for maximal independence with ADLs and ADL transfers. Pt completed bicep curls, tricep extension, and shoulder press. Cueing required to ensure proper form and technique for proper muscle activation. 3x10 repetitions. Activity also completed to increase pt tolerance to OT intervention and maintain selective attention to task in the busy gym. He  ended with 10 min on the NuStep to challenge functional activity tolerance and maintain attention to task without redirection needed for attention/perseveration. He returned to his room and was assisted in calling his mom per his request. He was left sitting up in the enclosure bed, bed secured.     Session 2 Pt received supine in the enclosure bed, reporting need to use the bathroom. He was initially joking with OT and respectful but remained perseverative on going home. He completed toileting tasks with (S) overall. He required mod redirection to participate at first and then faded to min A as he sat EOM and completed cognitive retraining activity focused on safety awareness. He engaged in discussion re multiple "safety situations" that he needed to problem solve through, I.e poor medication management, home repair problems, and safety awareness. He required mod-max A to process through situations, often masking through humor and requiring redirection. He asked "how much longer" every 2 minutes for 10 min. He was easily redirected initially and then began escalating quickly. He began pacing through the unit perseverative on going home and went to his room where he gathered up all his belongings. He was no longer redirectable. Another staff member was able to build rapport and convince pt to go back to his room after he attempted elopement via elevator. He was left in the secured enclosure bed.   ABS 39  Therapy Documentation Precautions:  Precautions Precautions: Fall Precaution Comments: enclosure bed Restrictions Weight Bearing Restrictions: No  Agitated Behavior Scale: TBI Observation Details Observation Environment: CIR Start of observation period - Date: 01/09/23 Start of observation period - Time: 1100  End of observation period - Date: 01/09/23 End of observation period - Time: 1200 Agitated Behavior Scale (DO NOT LEAVE BLANKS) Short attention span, easy distractibility, inability to  concentrate: Present to a moderate degree Impulsive, impatient, low tolerance for pain or frustration: Present to a moderate degree (3) Uncooperative, resistant to care, demanding: Present to a slight degree Violent and/or threatening violence toward people or property: Absent Explosive and/or unpredictable anger: Absent Rocking, rubbing, moaning, or other self-stimulating behavior: Absent Pulling at tubes, restraints, etc.: Absent Wandering from treatment areas: Present to a slight degree Restlessness, pacing, excessive movement: Present to a slight degree Repetitive behaviors, motor, and/or verbal: Present to a moderate degree Rapid, loud, or excessive talking: Present to a moderate degree Sudden changes of mood: Present to a moderate degree Easily initiated or excessive crying and/or laughter: Absent Self-abusiveness, physical and/or verbal: Absent Agitated behavior scale total score: 27   Therapy/Group: Individual Therapy  Crissie Reese 01/09/2023, 12:13 PM

## 2023-01-09 NOTE — Progress Notes (Signed)
Speech Language Pathology TBI Note  Patient Details  Name: Shawn Mills MRN: [id number] Date of Birth: 22-Sep-1980  Today's Date: 01/09/2023 SLP Individual Time: 0908-1002 SLP Individual Time Calculation (min): 54 min  Short Term Goals: Week 1: SLP Short Term Goal 1 (Week 1): Pt will utilize external aids to answer orientation questions with >80% accuracy. SLP Short Term Goal 2 (Week 1): Pt will complete functional safety awareness tasks wtih >50% accuracy given min cues. SLP Short Term Goal 3 (Week 1): Pt will sustain attention during structured task for >5 minutes with min cues. SLP Short Term Goal 4 (Week 1): Pt will utilize compensatory word finding strategies in structured tasks with >75% accuracy. SLP Short Term Goal 5 (Week 1): Pt will follow 1-2 step auditory commands with >80% accuracy.  Skilled Therapeutic Interventions: Skilled treatment session focused on cognitive goals. Upon arrival, patient was awake while upright in the enclosure bed. Patient initially sat EOB and perseverative on going home. SLP provided reeducation regarding patient's current brain injury, progress, and ongoing deficits. Reinforcement will be needed. Patient stood without warning in the middle of an informal conversation but did verbalize "excuse me, I need to use the bathroom" and was continent of urine. Patient ambulated to the nurses station and recalled the 2 items he wanted (fruit and soda) with Mod verbal cues. Patient able to locate items, fill cup with ice, etc with supervision verbal cues. Patient then challenged to consume his snack in the dayroom with focus on attention and social awareness in regards to decreased use of profanity, etc. Patient able to maintain an appropriate conversation for ~2 minutes but then reverted back to profanity, however, this may be baseline. Patient able to answer orientation questions with Mod verbal cues for use of external aids. MD arrived and discussed current discharge  goals. Patient verbalized awareness that he was not ready to discharge but continued to report he was leaving tonight despite recommendations. Patient willingly returned to enclosure bed at end of session with all needs within reach. Continue with current plan of care.      Pain No/Denies Pain   Agitated Behavior Scale: TBI Observation Details Observation Environment: CIR Start of observation period - Date: 01/09/23 Start of observation period - Time: 0908 End of observation period - Date: 01/09/23 End of observation period - Time: 1002 Agitated Behavior Scale (DO NOT LEAVE BLANKS) Short attention span, easy distractibility, inability to concentrate: Present to a slight degree Impulsive, impatient, low tolerance for pain or frustration: Present to a slight degree Uncooperative, resistant to care, demanding: Present to a slight degree Violent and/or threatening violence toward people or property: Absent Explosive and/or unpredictable anger: Absent Rocking, rubbing, moaning, or other self-stimulating behavior: Absent Pulling at tubes, restraints, etc.: Absent Wandering from treatment areas: Present to a slight degree Restlessness, pacing, excessive movement: Present to a slight degree Repetitive behaviors, motor, and/or verbal: Present to a moderate degree Rapid, loud, or excessive talking: Present to a moderate degree Sudden changes of mood: Absent Easily initiated or excessive crying and/or laughter: Absent Self-abusiveness, physical and/or verbal: Absent Agitated behavior scale total score: 23  Therapy/Group: Individual Therapy  Cephus Tupy 01/09/2023, 10:15 AM

## 2023-01-09 NOTE — Patient Care Conference (Signed)
Inpatient RehabilitationTeam Conference and Plan of Care Update Date: 01/09/2023   Time: 10:13 AM    Patient Name: Shawn Mills      Medical Record Number: [id number]  Date of Birth: 01/03/1981 Sex: Male         Room/Bed: 4W20C/4W20C-01 Payor Info: Payor: [employer] / Plan: [employer] [facility]  / Product Type: *No Product type* /    Admit Date/Time:  01/04/2023  6:20 PM  Primary Diagnosis:  TBI (traumatic brain injury) [facility])  Hospital Problems: Principal Problem:   TBI (traumatic brain injury) [facility])    Expected Discharge Date: Expected Discharge Date: 01/23/23  Team Members Present: Physician leading conference: Dr. Elijah Birk Social Worker Present: Cecile Sheerer, LCSWA Nurse Present: Vedia Pereyra, RN PT Present: Malachi Pro, PT OT Present: Jake Shark, OT SLP Present: Feliberto Gottron, SLP PPS Coordinator present : Fae Pippin, SLP     Current Status/Progress Goal Weekly Team Focus  Bowel/Bladder   continent of b/b   maintain continence   offer toileting q2 hours and PRN    Swallow/Nutrition/ Hydration               ADL's   RLAS IV-V. Agitated at times but has been overall redirectable, perseverative on going home. CGA- (S) with balance and ADLs. Good automatic sequencing   Supervision overall. Will have support of SO, her daughter and mother   ADL retraining, dynamic balance- all to reduce fall risk and improve independence with ADLs at home    Mobility   supervision all mobility   supervision  Family ed, high level baalance and multitasking, safety awareness    Communication                Safety/Cognition/ Behavioral Observations  Rancho Level V: Max A for recall of daily information with use of external aids. Mod A for problem solving and selective attention. Continues with confabulation and language of confusion. Remain in enclosure bed due to poor safety awareness.   Min A   improved orientation, functional problem solving and safety with use  of call bell. Use of external aids for memory.    Pain   Pain is well controlled   maintain pain level <4 on 0-10 pain scale   assess pain level q shift and PRN    Skin   skin is intact   maintain skin integrity  assess skin q shift for breakdown      Discharge Planning:  Pt will discharge to home ewith his fiance and support from her dtr, and his mother. SW will confirm there are no barriers to discharge.   Team Discussion: TBI. Behavior Plan. Rancho IV/V. Requesting tele-sitter today. Enclosure bed in place. "Wants to go home" Adjusting medications related to behaviors vs alcohol withdrawal. Time toileting for continence. Working to increase PO intake. Monitoring sleep/wake cycle. Therapies will continue to work on balance, attention to task, memory, and safety awareness. Family to change visiting hours to evening after therapies.  Patient on target to meet rehab goals: yes, progressing towards goals with discharge date of 01/23/23  *See Care Plan and progress notes for long and short-term goals.   Revisions to Treatment Plan:  Medication adjustments. Tele-sitter.  Adjust family visiting hours Teaching Needs: Medications, safety, self care, gait/transfer training, skin care, etc.   Current Barriers to Discharge: Decreased caregiver support, Incontinence, and Behavior  Possible Resolutions to Barriers: Family education Regain continence of bladder Self monitor behaviors/no alcohol Order recommended DME if needed.  Medical Summary Current Status: medicalyl complicated by moderate malnutrition, alcohol withdrawl, cognitive/behavioral deficits/aggitation, headaches, and constipation  Barriers to Discharge: Behavior/Mood;Inadequate Nutritional Intake;Medical stability;Self-care education;Uncontrolled Pain  Barriers to Discharge Comments: behavior/aggitation, headaches, alcohol withdrawal, headache w/ facial fractures Possible Resolutions to [employer] Focus: titrating  medications to control mood/behavior and aggitation as result of TI and alcohol withdrawl, monitorring sleep/wake cycle, titrating pain medications, monitorring fractures for healing   Continued Need for Acute Rehabilitation Level of Care: The patient requires daily medical management by a physician with specialized training in physical medicine and rehabilitation for the following reasons: Direction of a multidisciplinary physical rehabilitation program to maximize functional independence : Yes Medical management of patient stability for increased activity during participation in an intensive rehabilitation regime.: Yes Analysis of laboratory values and/or radiology reports with any subsequent need for medication adjustment and/or medical intervention. : Yes   I attest that I was present, lead the team conference, and concur with the assessment and plan of the team.   Jearld Adjutant 01/09/2023, 1:35 PM
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
## 2023-01-10 MED ORDER — GABAPENTIN 300 MG PO CAPS
600.0000 mg | ORAL_CAPSULE | Freq: Three times a day (TID) | ORAL | Status: DC
  Administered 2023-01-10 – 2023-01-12 (×7): 600 mg via ORAL
  Filled 2023-01-10 (×7): qty 2

## 2023-01-10 NOTE — Progress Notes (Signed)
Speech Language Pathology TBI Note  Patient Details  Name: Shawn Mills MRN: [id number] Date of Birth: Dec 23, 1980  Today's Date: 01/10/2023 SLP Individual Time: 0730-0825 SLP Individual Time Calculation (min): 55 min  Short Term Goals: Week 1: SLP Short Term Goal 1 (Week 1): Pt will utilize external aids to answer orientation questions with >80% accuracy. SLP Short Term Goal 2 (Week 1): Pt will complete functional safety awareness tasks wtih >50% accuracy given min cues. SLP Short Term Goal 3 (Week 1): Pt will sustain attention during structured task for >5 minutes with min cues. SLP Short Term Goal 4 (Week 1): Pt will utilize compensatory word finding strategies in structured tasks with >75% accuracy. SLP Short Term Goal 5 (Week 1): Pt will follow 1-2 step auditory commands with >80% accuracy.  Skilled Therapeutic Interventions: Skilled treatment session focused on cognitive goals. Upon arrival, patient was awake in the enclosure bed. Patient perseverative on going home and confabulating a story regarding going with family to clean out a house. SLP facilitated session by providing passive orientation and constant redirection. Patient able to maintain a topic of conversation that did not involve discharging home today for ~10 minutes. Patient was continent of urine X 2 throughout session and self-fed his breakfast meal while participating in an informal conversation for ~15 minutes. Patient reporting feeling "sick" after his meal and ambulated to the nurse's station and retrieved a soda, cup, and ice with overall Min verbal cues. Towards end of session, patient becoming more perseverative on discharging today and began to pack his items. SLP attempted to verbally reason with patient with minimal success but patient eventually agreeable to return to enclosure bed. Patient left secured in enclosure bed with all needs within reach. Continue with current plan of care.      Pain Pain in right knee,  unable to rate Nursing aware and administered medications   Agitated Behavior Scale: TBI Observation Details Observation Environment: CIR Start of observation period - Date: 01/10/23 Start of observation period - Time: 0730 End of observation period - Date: 01/10/23 End of observation period - Time: 0820 Agitated Behavior Scale (DO NOT LEAVE BLANKS) Short attention span, easy distractibility, inability to concentrate: Present to a slight degree Impulsive, impatient, low tolerance for pain or frustration: Present to a moderate degree Uncooperative, resistant to care, demanding: Present to a slight degree Violent and/or threatening violence toward people or property: Absent Explosive and/or unpredictable anger: Absent Rocking, rubbing, moaning, or other self-stimulating behavior: Absent Pulling at tubes, restraints, etc.: Absent Wandering from treatment areas: Present to a slight degree Restlessness, pacing, excessive movement: Present to a slight degree Repetitive behaviors, motor, and/or verbal: Present to a moderate degree Rapid, loud, or excessive talking: Present to a moderate degree Sudden changes of mood: Absent Easily initiated or excessive crying and/or laughter: Absent Self-abusiveness, physical and/or verbal: Absent Agitated behavior scale total score: 24  Therapy/Group: Individual Therapy  Tian Mcmurtrey 01/10/2023, 1:52 PM

## 2023-01-10 NOTE — Progress Notes (Signed)
Occupational Therapy TBI Note  Patient Details  Name: Shawn Mills MRN: [id number] Date of Birth: September 14, 1980  Session 1 Today's Date: 01/10/2023 OT Individual Time: 4098-1191 OT Individual Time Calculation (min): 40 min    Session 2  Today's Date: 01/10/2023 OT Individual Time: 4782-9562 OT Individual Time Calculation (min): 40 min  and Today's Date: 01/10/2023 OT Missed Time: 20 Minutes Missed Time Reason: Other (comment) (agitation increasing)   Short Term Goals: Week 1:  OT Short Term Goal 1 (Week 1): Patient will recall that he is in the hospital with min quetsioning cues. OT Short Term Goal 2 (Week 1): Patient will demonstrate improved safety awareness by sitting down to don pants with min questioning cues. OT Short Term Goal 3 (Week 1): Patient will maintain standing balance at the sink with CGA during functional BADL tasks.  Skilled Therapeutic Interventions/Progress Updates:    Session 1 Pt received supine in the enclosure bed, calm and agreeable to take a shower. ADL retraining with focus on sequencing through basic ADL routine. He required min redirection to task throughout session. He was able to maintain dynamic balance throughout, with min cueing for holding onto the grab bar during distal LB bathing. He had appropriate thoroughness. He did have one LOB during dressing in standing. He required cueing for remaining seated during LB dressing. He then sprayed shaving cream into his mouth while brushing his teeth despite Ot's best attempts to not allow this and to remove from pt. He returned to the enclosure bed with mod cueing for redirection and him initially refusing.Voltarin applied to his R knee. Bed secured, drink provided.    Session 2 Pt received sitting in the enclosure bed with 6/10 pain in his R knee but agreeable to session and calm. He completed functional mobility to the therapy gym with (S). He transferred to the NuStep for addressing sustained attention, increased  functional activity tolerance, and to develop appropriate pragmatic relationships with staff/patients around him in stimulating environment. He was able to choose music during the activity and appropriately maintain conversation with frequent interruptions with perseveration on going home. He completed 20 min on the Nustep on level 5. He then held a 10 lb dumbbell and completed 2x10 tricep extension and bicep curl for similar intervention purpose. His perseveration gradually increased and he began verbalizing plans of how to leave hospital. OT subtly directed pt back to room. He was very resistant to getting back to bed. Once he sat down in the bed OT was able to maneuver enclosure bed closed, making him even more agitated. He began cursing at OT and name calling. Pt left with bed secured. RN aware of agitation increasing. 20 min missed.  ABS 32   Therapy Documentation Precautions:  Precautions Precautions: Fall Precaution Comments: enclosure bed Restrictions Weight Bearing Restrictions: No Pain Assessment Pain Scale: 0-10 Pain Score: 7  Pain Type: Acute pain Pain Location: Leg Pain Orientation: Right Pain Descriptors / Indicators: Aching Pain Frequency: Intermittent Pain Onset: On-going Pain Intervention(s): Medication (See eMAR) Agitated Behavior Scale: TBI Observation Details Observation Environment: CIR Start of observation period - Date: 01/10/23 Start of observation period - Time: 1100 End of observation period - Date: 01/10/23 End of observation period - Time: 1130 Agitated Behavior Scale (DO NOT LEAVE BLANKS) Short attention span, easy distractibility, inability to concentrate: Present to a moderate degree Impulsive, impatient, low tolerance for pain or frustration: Present to a moderate degree Uncooperative, resistant to care, demanding: Absent Violent and/or threatening violence toward people or property:  Absent Explosive and/or unpredictable anger: Absent Rocking,  rubbing, moaning, or other self-stimulating behavior: Absent Pulling at tubes, restraints, etc.: Absent Wandering from treatment areas: Present to a slight degree Restlessness, pacing, excessive movement: Present to a moderate degree Repetitive behaviors, motor, and/or verbal: Present to a moderate degree Rapid, loud, or excessive talking: Present to a moderate degree Sudden changes of mood: Present to a moderate degree Easily initiated or excessive crying and/or laughter: Absent Self-abusiveness, physical and/or verbal: Absent Agitated behavior scale total score: 27   Therapy/Group: Individual Therapy  Crissie Reese 01/10/2023, 12:15 PM
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
## 2023-01-10 NOTE — Progress Notes (Signed)
Patient ID: Shawn Mills, male   DOB: August 06, 1980, 42 y.o.   MRN: [id number]  [id number] spoke with pt s/o Shawn Mills to provide updates from team conference, and d/c date 7/16. Discussed family edu. Will follow-up with SW to confirm a date that works well for her, and his mother since his mother has an autistic grandchild she watches during the day.   Cecile Sheerer, MSW, LCSWA Office: [phone] Cell: [phone] Fax: [phone]

## 2023-01-11 MED ORDER — ACETAMINOPHEN 500 MG PO TABS
1000.0000 mg | ORAL_TABLET | Freq: Three times a day (TID) | ORAL | Status: DC
  Administered 2023-01-11 – 2023-01-23 (×36): 1000 mg via ORAL
  Filled 2023-01-11 (×37): qty 2

## 2023-01-11 MED ORDER — OXYCODONE HCL 5 MG PO TABS
5.0000 mg | ORAL_TABLET | Freq: Three times a day (TID) | ORAL | Status: DC | PRN
  Administered 2023-01-11 – 2023-01-12 (×4): 5 mg via ORAL
  Filled 2023-01-11 (×4): qty 1

## 2023-01-11 MED ORDER — METHOCARBAMOL 500 MG PO TABS
1000.0000 mg | ORAL_TABLET | Freq: Three times a day (TID) | ORAL | Status: DC
  Administered 2023-01-11 – 2023-01-12 (×4): 1000 mg via ORAL
  Filled 2023-01-11 (×4): qty 2

## 2023-01-11 NOTE — Progress Notes (Signed)
Physical Therapy Session Note  Patient Details  Name: Shawn Mills MRN: [id number] Date of Birth: 18-Jan-1981  Today's Date: 01/11/2023 PT Individual Time: 0901-0946 PT Individual Time Calculation (min): 45 min   Short Term Goals: Week 1:  PT Short Term Goal 1 (Week 1): Pt will perform basic transfers with CGA consistently. PT Short Term Goal 2 (Week 1): Pt will ambulate x300' with CGA consistently. PT Short Term Goal 3 (Week 1): Pt safety awareness will improve, only requiring min cueing to remain on task during session.  Skilled Therapeutic Interventions/Progress Updates: Pt presents in enclosure bed and agreeable to therapy although begins session stating that he is going home today 2/2 issues w/ nephew.  Perseverates on this w/ attempts to re-direct to need for therapy.  Pt transfers sit to stand w/ supervision.  Pt amb at least 200' w/o AD and fluctuates between close supervision and CGA when c/o pain to R knee w/ WB.  Knee doesn't buckle but LOB.  Pt amb w/ stiff-legged pattern and slightly increased BOS.  Pt amb counting pictures on walls, forward and backward.  Pt performed toe taps to 5 3/4" to 8" platform.  Pt turns slipper socks inside out but can't explain reason?  Pt will turn socks right for amb.  Pt amb to dayroom and performs Nu-step at Level 5 x 8" (pt reset timer during session.  Pt shortening arm handles and resists returning to correct length.  Pt constantly cursing during session, not directed at PT, just in normal conversation about pain, going home, other therapists.  Pt returned to room 2/2 increased agitation (nsg notified in message) and amb to BR.  Pt sits on toilet and continent of bladder.  Pt washes hands and agrees to return to enclosure bed.  Call bell w/in, and secured in bed.  Missed time 15'.     Therapy Documentation Precautions:  Precautions Precautions: Fall Precaution Comments: enclosure bed Restrictions Weight Bearing Restrictions: No General: PT Amount  of Missed Time (min): 15 Minutes PT Missed Treatment Reason: Increased agitation Vital Signs:   Pain: c/o R LE pain but not quantifying. Pain Assessment Pain Scale: 0-10 Pain Score: 10-Worst pain ever Pain Type: Acute pain Pain Location: Leg Pain Orientation: Right;Left Pain Descriptors / Indicators: Aching Pain Frequency: Intermittent Pain Onset: On-going Pain Intervention(s): Medication (See eMAR)     Therapy/Group: Individual Therapy  [name] 01/11/2023, 9:49 AM
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
## 2023-01-11 NOTE — Progress Notes (Signed)
Physical Therapy TBI Note  Patient Details  Name: Shawn Mills MRN: [id number] Date of Birth: 1981/07/07  Today's Date: 01/11/2023 PT Individual Time: 1415-1500 PT Individual Time Calculation (min): 45 min   Short Term Goals: Week 1:  PT Short Term Goal 1 (Week 1): Pt will perform basic transfers with CGA consistently. PT Short Term Goal 2 (Week 1): Pt will ambulate x300' with CGA consistently. PT Short Term Goal 3 (Week 1): Pt safety awareness will improve, only requiring min cueing to remain on task during session.  Skilled Therapeutic Interventions/Progress Updates:  Patient greeted sitting in his enclosure bed with RN, Huntley Dec, present to administer medications- Patient agreeable to PT treatment session. Patient stood from enclosure bed without the use of an AD and SBA for safety- Patient reporting increased RLE pain with functional mobility tasks, however agreeable to participating in session since pain medication was administered. Patient agreeable to going to the day room for fourth of July party stating, "we can be cool and stop by." Patient donned his [place] hat and gait trained to the day room with CGA/SBA for safety- Antalgic gait pattern noted with mild posterior lean. Patient greeted recreational therapist and was able to verbalize what ice cream he would like. While sitting and eating his ice cream, patient discussed his life with therapist (where he lived, sports teams he enjoys, his girlfriend, etc.) and asked questions about therapist's life as well. Patient participated in completing a word search about the Fourth of July with therapist and patient taking turns finding words. Patient offered to assist Misty Stanley with cleaning up the rehab gym after the party and ambulated throughout the day room while picking up various items- Patient needed redirection during one interaction with another family in the rehab gym due to inappropriate behaviors and was eventually able to be redirected. Patient  gait trained back to his room where he started talking about needing "to get out" and "go home." Once in the enclosure bed, patient called his mom who reassured him about staying in the hospital. Patient left in the enclosure bed with all buckles in place, call bell and phone within reach and all needs met. RN notified of patient positioning.    Therapy Documentation Precautions:  Precautions Precautions: Fall Precaution Comments: enclosure bed Restrictions Weight Bearing Restrictions: No  Pain: Pain Assessment Pain Score: 6 - RLE pain which RN administered pain medication at start of treatment session.   Agitated Behavior Scale: TBI Observation Details Observation Environment: CIR Start of observation period - Date: 01/11/23 Start of observation period - Time: 1415 End of observation period - Date: 01/11/23 End of observation period - Time: 1500 Agitated Behavior Scale (DO NOT LEAVE BLANKS) Short attention span, easy distractibility, inability to concentrate: Present to a moderate degree Impulsive, impatient, low tolerance for pain or frustration: Present to a moderate degree Uncooperative, resistant to care, demanding: Present to a slight degree Violent and/or threatening violence toward people or property: Absent Explosive and/or unpredictable anger: Absent Rocking, rubbing, moaning, or other self-stimulating behavior: Absent Pulling at tubes, restraints, etc.: Absent Wandering from treatment areas: Absent Restlessness, pacing, excessive movement: Present to a slight degree Repetitive behaviors, motor, and/or verbal: Present to a moderate degree Rapid, loud, or excessive talking: Present to a moderate degree Sudden changes of mood: Present to a slight degree Easily initiated or excessive crying and/or laughter: Absent Self-abusiveness, physical and/or verbal: Absent Agitated behavior scale total score: 25   Therapy/Group: Individual Therapy  Ameliana Brashear 01/11/2023,  4:06 PM

## 2023-01-11 NOTE — Progress Notes (Signed)
Occupational Therapy TBI Note  Patient Details  Name: Shawn Mills MRN: [id number] Date of Birth: [birth year]  Today's Date: 01/11/2023 OT Individual Time: 1300-1345 OT Individual Time Calculation (min): 45 min    Skilled Therapeutic Interventions/Progress Updates: Patient received sitting up in bed. Perseverative regarding leaving today to plan nephews funeral. Patient became increasingly agitated regarding discharge home and 'walking out to his truck where his girlfriend is waiting.' Patient initially participatory with attention and organizing task working to list out the steps of home chores performed prior to injury. Worked on patient using pen to write out list on his own with moderately legible hand writing. Patient became perseverative with his truck being out side and his girlfriend waiting for him, then stating his friend lived near by and would just walk to his home. Patient unable to list streets traveling from [place] to get to friends house or just the house the friend lives on. Patient with increasing frustration with orientation tasks and unable to follow cues for redirection, but willing to return to room and get into the enclosure bed.     Therapy Documentation Precautions:  Precautions Precautions: Fall Precaution Comments: enclosure bed Restrictions Weight Bearing Restrictions: No General: General PT Missed Treatment Reason: Pain Vital Signs:   Pain:   Agitated Behavior Scale: TBI  Agitated Behavior Scale (DO NOT LEAVE BLANKS) Short attention span, easy distractibility, inability to concentrate: Present to a moderate degree Impulsive, impatient, low tolerance for pain or frustration: Present to a moderate degree Uncooperative, resistant to care, demanding: Present to a slight degree Violent and/or threatening violence toward people or property: Absent Explosive and/or unpredictable anger: Absent Rocking, rubbing, moaning, or other self-stimulating behavior:  Absent Pulling at tubes, restraints, etc.: Absent Wandering from treatment areas: Absent Restlessness, pacing, excessive movement: Present to a slight degree Repetitive behaviors, motor, and/or verbal: Present to a moderate degree Rapid, loud, or excessive talking: Present to a moderate degree Sudden changes of mood: Present to a slight degree Easily initiated or excessive crying and/or laughter: Absent Self-abusiveness, physical and/or verbal: Absent   ADL: ADL Eating: Supervision/safety Grooming: Supervision/safety Upper Body Bathing: Supervision/safety Lower Body Bathing: Minimal assistance Upper Body Dressing: Supervision/safety Lower Body Dressing: Minimal assistance Toileting: Minimal assistance Toilet Transfer: Minimal assistance Walk-In Shower Transfer: Minimal assistance   Therapy/Group: Individual Therapy  Warnell Forester 01/11/2023, 3:56 PM

## 2023-01-11 NOTE — Progress Notes (Signed)
Physical Therapy TBI Note  Patient Details  Name: Shawn Mills MRN: [id number] Date of Birth: 02-11-1981  Today's Date: 01/11/2023 PT Individual Time: 7829-5621 PT Individual Time Calculation (min): 40 min   Short Term Goals: Week 1:  PT Short Term Goal 1 (Week 1): Pt will perform basic transfers with CGA consistently. PT Short Term Goal 2 (Week 1): Pt will ambulate x300' with CGA consistently. PT Short Term Goal 3 (Week 1): Pt safety awareness will improve, only requiring min cueing to remain on task during session.   Skilled Therapeutic Interventions/Progress Updates:    Pt presents in enclosure bed. Therapeutic use of self to gain rapport with patient, supervision for OOB and independent with bed mobility. Pt agreeable to "check out the party" going on in the therapy gym for the 4th of July. CGA overall for functional gait on unit > 150' with antalgic gait pattern, flexed posture at times, and wider BOS. Pt actively and appropriately (except for the use of profane language just with the PT) participated in social group setting for a dance group to work on dynamic standing balance and social appropriateness and attention to an activity. Pt followed along with all the dance moves in a standing position following the instructor including single limb stance, reaching down to the floor, etc with CGA to occasional min assist. Pt interacted appropriately with other staff and patients nearby and tolerated for about 25 min total. Pt complaining of pain overall and ultimately states he "can't do this anymore" due to the pain level being too high. Pt walks back to room with CGA hunched over and antalgic and self selects to get back into the enclosure bed seated but still engaging with therapist. Then pt transitions to talking about needing to leave and go home, redirected as able, pt ends up calling his girlfriend, Shawn Mills, she also reminded him he will be there a couple more days but she would be coming to  visit him. Pt continues on about needing to leave and therapist continues to provide redirection and education as able. Ended session 20 min early due to pt starting to perseverate on leaving and talking about exit points in hospital and already back in the enclosure bed as to not increase agitation at this time. RN was made aware of request for pain medication.   Therapy Documentation Precautions:  Precautions Precautions: Fall Precaution Comments: enclosure bed Restrictions Weight Bearing Restrictions: No General: PT Amount of Missed Time (min): 20 Minutes PT Missed Treatment Reason: Pain   Pain:  C/o pain in R knee (did not rate) and stomach (after lovenox injection) throughout session - redirected as able and due to pain medication at end of session - RN was aware.   Agitated Behavior Scale: TBI Observation Details Observation Environment: therapy Start of observation period - Date: 01/11/23 Start of observation period - Time: 1055 End of observation period - Date: 01/11/23 End of observation period - Time: 1135 Agitated Behavior Scale (DO NOT LEAVE BLANKS) Short attention span, easy distractibility, inability to concentrate: Present to a moderate degree Impulsive, impatient, low tolerance for pain or frustration: Present to a moderate degree Uncooperative, resistant to care, demanding: Present to a slight degree Violent and/or threatening violence toward people or property: Absent Explosive and/or unpredictable anger: Absent Rocking, rubbing, moaning, or other self-stimulating behavior: Present to a slight degree Pulling at tubes, restraints, etc.: Absent Wandering from treatment areas: Absent Restlessness, pacing, excessive movement: Present to a slight degree Repetitive behaviors, motor, and/or verbal: Present to  a moderate degree Rapid, loud, or excessive talking: Present to a moderate degree Sudden changes of mood: Present to a slight degree Easily initiated or excessive  crying and/or laughter: Absent Self-abusiveness, physical and/or verbal: Absent Agitated behavior scale total score: 26     Therapy/Group: Individual Therapy  Karolee Stamps Darrol Poke, PT, DPT, CBIS  01/11/2023, 11:57 AM

## 2023-01-12 ENCOUNTER — Inpatient Hospital Stay (HOSPITAL_COMMUNITY): Payer: 59

## 2023-01-12 DIAGNOSIS — F10129 Alcohol abuse with intoxication, unspecified: Secondary | ICD-10-CM

## 2023-01-12 DIAGNOSIS — S066X0D Traumatic subarachnoid hemorrhage without loss of consciousness, subsequent encounter: Secondary | ICD-10-CM

## 2023-01-12 DIAGNOSIS — S0292XD Unspecified fracture of facial bones, subsequent encounter for fracture with routine healing: Secondary | ICD-10-CM

## 2023-01-12 DIAGNOSIS — W1789XD Other fall from one level to another, subsequent encounter: Secondary | ICD-10-CM

## 2023-01-12 MED ORDER — AMITRIPTYLINE HCL 25 MG PO TABS
25.0000 mg | ORAL_TABLET | Freq: Every day | ORAL | Status: DC
  Administered 2023-01-12 – 2023-01-14 (×3): 25 mg via ORAL
  Filled 2023-01-12 (×3): qty 1

## 2023-01-12 MED ORDER — OXYCODONE HCL 5 MG PO TABS
5.0000 mg | ORAL_TABLET | Freq: Three times a day (TID) | ORAL | Status: DC
  Administered 2023-01-12 – 2023-01-21 (×29): 5 mg via ORAL
  Filled 2023-01-12 (×30): qty 1

## 2023-01-12 MED ORDER — QUETIAPINE FUMARATE 50 MG PO TABS
100.0000 mg | ORAL_TABLET | Freq: Every day | ORAL | Status: DC
  Administered 2023-01-13 – 2023-01-15 (×3): 100 mg via ORAL
  Filled 2023-01-12 (×3): qty 2

## 2023-01-12 MED ORDER — METHOCARBAMOL 500 MG PO TABS
1000.0000 mg | ORAL_TABLET | Freq: Three times a day (TID) | ORAL | Status: DC | PRN
  Administered 2023-01-13 – 2023-01-15 (×3): 1000 mg via ORAL
  Filled 2023-01-12 (×3): qty 2

## 2023-01-12 MED ORDER — TOPIRAMATE 25 MG PO TABS
25.0000 mg | ORAL_TABLET | Freq: Two times a day (BID) | ORAL | Status: DC | PRN
  Administered 2023-01-14: 25 mg via ORAL
  Filled 2023-01-12 (×2): qty 1

## 2023-01-12 MED ORDER — GABAPENTIN 400 MG PO CAPS
800.0000 mg | ORAL_CAPSULE | Freq: Three times a day (TID) | ORAL | Status: DC
  Administered 2023-01-12 – 2023-01-19 (×20): 800 mg via ORAL
  Filled 2023-01-12 (×21): qty 2

## 2023-01-12 MED ORDER — TRAZODONE HCL 50 MG PO TABS
100.0000 mg | ORAL_TABLET | Freq: Every evening | ORAL | Status: DC | PRN
  Administered 2023-01-15 – 2023-01-16 (×2): 100 mg via ORAL
  Filled 2023-01-12 (×2): qty 2
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
## 2023-01-12 NOTE — Progress Notes (Addendum)
Speech Language Pathology Weekly Progress and Session Note  Patient Details  Name: Shawn Mills MRN: [id number] Date of Birth: August 17, 1980  Beginning of progress report period: January 04, 2023 End of progress report period: January 12, 2023  Today's Date: 01/12/2023 SLP Individual Time: 1430-1455 SLP Individual Time Calculation (min): 25 min  Short Term Goals: Week 1: SLP Short Term Goal 1 (Week 1): Pt will utilize external aids to answer orientation questions with >80% accuracy. SLP Short Term Goal 1 - Progress (Week 1): Not met SLP Short Term Goal 2 (Week 1): Pt will complete functional safety awareness tasks wtih >50% accuracy given min cues. SLP Short Term Goal 2 - Progress (Week 1): Met SLP Short Term Goal 3 (Week 1): Pt will sustain attention during structured task for >5 minutes with min cues. SLP Short Term Goal 3 - Progress (Week 1): Met SLP Short Term Goal 4 (Week 1): Pt will utilize compensatory word finding strategies in structured tasks with >75% accuracy. SLP Short Term Goal 4 - Progress (Week 1): Not met SLP Short Term Goal 5 (Week 1): Pt will follow 1-2 step auditory commands with >80% accuracy. SLP Short Term Goal 5 - Progress (Week 1): Met    New Short Term Goals: Week 2: SLP Short Term Goal 1 (Week 2): Pt will utilize external aids to answer orientation questions Min verbal cues. SLP Short Term Goal 2 (Week 2): Patient will demonstrate sustained attention to functional tasks for 15 minutes with Mod verbal cues for redirection. SLP Short Term Goal 3 (Week 2): Patient will demonstrate functional problem solving for basic and familiar tasks with Min verbal cues. SLP Short Term Goal 4 (Week 2): Patient will utilize word-finding strategies during structured language tasks with Mod verbal cues.  Weekly Progress Updates: Patient has made functional gains and has met 3 of 5 STGs this reporting period. Currently, patient demonstrates behaviors consistent with a Rancho Level IV-V and  requires overall Mod A multimodal cues for functional problem solving with basic and familiar tasks and sustained attention. Max A multimodal cues are needed for recall of functional information with use of aids and awareness. Patient remains confabulatory and perseverative on topics with intermittent verbal agitation requiring the need for an enclosure bed. Patient can express his basic wants/needs but obvious word-finding deficits noted. Difficult to differentiate cognitive deficits vs true language impairments with diagnostic treatment ongoing. Patient would benefit from continued skilled SLP intervention to maximize his cognitive-linguistic functioning and overall functional independence prior to discharge.      Intensity: Minumum of 1-2 x/day, 30 to 90 minutes Frequency: 1 to 3 out of 7 days Duration/Length of Stay: 01/23/23 Treatment/Interventions: Cognitive remediation/compensation;Environmental controls;Speech/Language facilitation;Cueing hierarchy;Functional tasks;Therapeutic Activities;Patient/family education   Daily Session  Skilled Therapeutic Interventions:  Skilled treatment session focused on cognitive goals. Upon arrival, patient awake while upright in enclosure bed. Patient did not want to exit the enclosure bed due to right knee pain. Nursing aware. Patient independently oriented to place and time and recalled some events from this morning, however, some confusion noted regarding details/sequence of events. Patient with decreased perseveration today and demonstrated increased ability to participate in a functional conversation regarding awareness and overall discharge planning. Patient left secured in enclosure bed with all needs within reach. Continue with current plan of care.      Pain Pain in right knee. Nursing aware. Patient repositioned in bed and pre medicated.   ABS: 19  Therapy/Group: Individual Therapy  Sher Hellinger 01/12/2023, 6:52 AM

## 2023-01-12 NOTE — Progress Notes (Signed)
Physical Therapy TBI Note  Patient Details  Name: Khizar Smither MRN: [id number] Date of Birth: 1980/07/24  Today's Date: 01/12/2023 PT Individual Time: 0900-1000 PT Individual Time Calculation (min): 60 min   Short Term Goals: Week 1:  PT Short Term Goal 1 (Week 1): Pt will perform basic transfers with CGA consistently. PT Short Term Goal 2 (Week 1): Pt will ambulate x300' with CGA consistently. PT Short Term Goal 3 (Week 1): Pt safety awareness will improve, only requiring min cueing to remain on task during session.  Skilled Therapeutic Interventions/Progress Updates:  Patient greeted supine in veil bed and agreeable to PT treatment session, however reporting significant Rt knee pain. RN reported she had already administered pain medication this morning, however patient stated no improvements in pain. Therapist placed two heating pads on patient's Rt knee with ace wrap in order to improve symptoms with physician notified and aware. Patient perseverated on pain throughout treatment session, however agreeable to participate in therapy and redirectable.   While sitting EOB in veil bed, patient was able to doff old clothes and don new shirt, shorts, socks and sweatshirt with set-up assistance. Patient stood without the use of an AD and supv. Patient gait trained to/from his room and day room with RUE around therapist's shoulder for support secondary to increased Rt knee pain and a significant antalgic gait pattern, however no LOB noted.   Patient was able to participate in Guess Who and Connect Four games in the day room. Patient tolerate one game of Guess Who and five games of Connect Four with good attention noted and appropriate deductive reasoning and strategy noted for each game. Patient won the final Scientist, clinical (histocompatibility and immunogenetics) Four and stated "Connect Four Bi**!" Patient requesting to return to his room at this time.   Patient gait trained back to his room with UE support and Supv for safety- Patient's  continued to be antalgic, however no LOB noted with good path-finding ability back to his room. While in his room, patient sat in manual wheelchair and ate his leftover tacos. Patient returned to his veil bed with moderate encouragement and left zipped up with call bell within reach and all needs met.    Therapy Documentation Precautions:  Precautions Precautions: Fall Precaution Comments: enclosure bed Restrictions Weight Bearing Restrictions: No  Pain: 12/10 Rt knee pain- RN and Physician aware. Therapist donned two heating pads with ace wrap to improve symptoms.   Agitated Behavior Scale: TBI Observation Details Observation Environment: CIR Start of observation period - Date: 01/12/23 Start of observation period - Time: 0900 End of observation period - Date: 01/12/23 End of observation period - Time: 1000 Agitated Behavior Scale (DO NOT LEAVE BLANKS) Short attention span, easy distractibility, inability to concentrate: Present to a slight degree Impulsive, impatient, low tolerance for pain or frustration: Present to a slight degree Uncooperative, resistant to care, demanding: Present to a slight degree Violent and/or threatening violence toward people or property: Absent Explosive and/or unpredictable anger: Absent Rocking, rubbing, moaning, or other self-stimulating behavior: Absent Pulling at tubes, restraints, etc.: Absent Wandering from treatment areas: Absent Restlessness, pacing, excessive movement: Absent Repetitive behaviors, motor, and/or verbal: Present to a slight degree Rapid, loud, or excessive talking: Present to a slight degree Sudden changes of mood: Absent Easily initiated or excessive crying and/or laughter: Absent Self-abusiveness, physical and/or verbal: Absent Agitated behavior scale total score: 19    Therapy/Group: Individual Therapy  Zyrion Coey 01/12/2023, 4:14 PM
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
## 2023-01-12 NOTE — Progress Notes (Signed)
Occupational Therapy Weekly Progress Note  Patient Details  Name: Shawn Mills MRN: [id number] Date of Birth: 1981-07-09  Beginning of progress report period: 01/05/23 End of progress report period: 01/12/23  Today's Date: 01/12/2023 OT Individual Time: 1110-1200 OT Individual Time Calculation (min): 50 min  and Today's Date: 01/12/2023 OT Missed Time: 10 Minutes Missed Time Reason: Pain   Patient has met 3 of 3 short term goals.  Ravin has had a difficult week with an emergence of more RLAS IV behaviors including increase in agitation, low frustration tolerance, multiple elopement attempts and verbal aggression. His behavior plan has been modified and team is frequently discussing ways to manage behaviors and keep pt+staff safe. His enclosure bed has been successful in curbing elopement attempts and promoting rest/sensory breaks when overstimulated. His balance has improved and he is able to complete his ADL routine standing with CGA- occasional min A. He has very poor awareness of deficits. His family visiting has been somewhat of a trigger and edu provided to them, so they are minimizing visits at this time.   Patient continues to demonstrate the following deficits: muscle weakness, decreased coordination and decreased motor planning, decreased initiation, decreased attention, decreased awareness, decreased problem solving, decreased safety awareness, decreased memory, delayed processing, and demonstrates behaviors consistent with Rancho Level IV, and decreased standing balance, decreased postural control, decreased balance strategies, and difficulty maintaining precautions and therefore will continue to benefit from skilled OT intervention to enhance overall performance with BADL and iADL.  Patient progressing toward long term goals..  Continue plan of care.  OT Short Term Goals Week 1:  OT Short Term Goal 1 (Week 1): Patient will recall that he is in the hospital with min quetsioning  cues. OT Short Term Goal 1 - Progress (Week 1): Met OT Short Term Goal 2 (Week 1): Patient will demonstrate improved safety awareness by sitting down to don pants with min questioning cues. OT Short Term Goal 2 - Progress (Week 1): Met OT Short Term Goal 3 (Week 1): Patient will maintain standing balance at the sink with CGA during functional BADL tasks. OT Short Term Goal 3 - Progress (Week 1): Met Week 2:  OT Short Term Goal 1 (Week 2): Pt will complete full ADL routine with no more than min cueing for safety OT Short Term Goal 2 (Week 2): Pt will reduce agitation as evidenced by 3 session with ABS <28 OT Short Term Goal 3 (Week 2): Pt will demo improved orientation via external cues, identifying place and time with no more than min cueing  Skilled Therapeutic Interventions/Progress Updates:    Pt received supine with un-rated pain in his R knee and back, agreeable to OT session. He was perseverative on this pain throughout session as well as on going home. 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 He was redirectable to a sense but overall never left this topic. He completed shower level ADL with very close (S) for safety with balance. Little carryover of previously provided safety cueing. He completed LB bathing in standing while holding onto grab bar with min-mod cueing. He demonstrated appropriate thoroughness during bathing. He was frequently dismissive of OT cueing. He dressed from EOB with close (S). OT wrapped his knee with heat pack to also provide pain relief. He completed 100 ft of functional mobility with antalgic gait, (S) overall. He transferred to the NuStep without OT cueing and despite OT requesting him to work on [employer] instead. He was agreeable to get off after ~2 minutes. He  completed memory task with 3 item sequence with 39% accuracy. He had moderate error recognition however overall poor sequencing, memory, and selective attention. He then engaged in brief thoracic stretching from EOM with  a therapy ball. He completed posterior stretch over ball, lateral stretch, and trunk rotation for pain management of his back which he reported helped. He reported too much fatigue and knee pain to finish final 10 min of session. He got into the enclosure bed without arguing and was left laying down, bed secured.     ABS 25  Therapy Documentation Precautions:  Precautions Precautions: Fall Precaution Comments: enclosure bed Restrictions Weight Bearing Restrictions: No   Therapy/Group: Individual Therapy  Crissie Reese 01/12/2023, 7:57 AM

## 2023-01-12 NOTE — Progress Notes (Signed)
Physical Therapy Weekly Progress Note  Patient Details  Name: Shawn Mills MRN: [id number] Date of Birth: 09/18/1980  Beginning of progress report period: January 05, 2023 End of progress report period: January 12, 2023  Patient has met 2 of 3 short term goals. Patient is progressing toward his LTGs since initial evaluation. Currently, the patient requires Supv/CGA with all functional mobility without the use of an AD. Patient requires frequent redirection throughout treatment session with perseveration on discharging and Rt knee pain leading to moderate agitation at times. Patient remains in a veil bed at this time for improved safety as patient continues to have impaired awareness to his situation, as well as impaired safety awareness. Patient did demonstrate improved ability to maintain attention to tasks today compared to yesterday. Will continue to work toward LTGs for a safe discharge home.   Patient continues to demonstrate the following deficits muscle weakness and Rt knee pain, impaired activity tolerance, poor frustration tolerance, decreased attention, decreased awareness, decreased problem solving, decreased safety awareness, decreased memory, and demonstrates behaviors consistent with Rancho Level V, and decreased standing balance and decreased balance strategies and therefore will continue to benefit from skilled PT intervention to increase functional independence with mobility.  Patient progressing toward long term goals..  Continue plan of care.  PT Short Term Goals Week 1:  PT Short Term Goal 1 (Week 1): Pt will perform basic transfers with CGA consistently. PT Short Term Goal 1 - Progress (Week 1): Met PT Short Term Goal 2 (Week 1): Pt will ambulate x300' with CGA consistently. PT Short Term Goal 2 - Progress (Week 1): Met PT Short Term Goal 3 (Week 1): Pt safety awareness will improve, only requiring min cueing to remain on task during session. PT Short Term Goal 3 - Progress (Week  1): Progressing toward goal Week 2:  PT Short Term Goal 1 (Week 2): STGs=LTGs secondary to ELOS   [name] 01/12/2023, 7:44 AM

## 2023-01-13 LAB — CBC WITH DIFFERENTIAL/PLATELET
Abs Immature Granulocytes: 0.03 10*3/uL (ref 0.00–0.07)
Basophils Absolute: 0.1 10*3/uL (ref 0.0–0.1)
Basophils Relative: 1 %
Eosinophils Absolute: 0.5 10*3/uL (ref 0.0–0.5)
Eosinophils Relative: 7 %
HCT: 39.3 % (ref 39.0–52.0)
Hemoglobin: 13.9 g/dL (ref 13.0–17.0)
Immature Granulocytes: 0 %
Lymphocytes Relative: 36 %
Lymphs Abs: 2.5 10*3/uL (ref 0.7–4.0)
MCH: 34 pg (ref 26.0–34.0)
MCHC: 35.4 g/dL (ref 30.0–36.0)
MCV: 96.1 fL (ref 80.0–100.0)
Monocytes Absolute: 0.5 10*3/uL (ref 0.1–1.0)
Monocytes Relative: 8 %
Neutro Abs: 3.3 10*3/uL (ref 1.7–7.7)
Neutrophils Relative %: 48 %
Platelets: 277 10*3/uL (ref 150–400)
RBC: 4.09 MIL/uL — ABNORMAL LOW (ref 4.22–5.81)
RDW: 12.3 % (ref 11.5–15.5)
WBC: 6.9 10*3/uL (ref 4.0–10.5)
nRBC: 0 % (ref 0.0–0.2)

## 2023-01-13 LAB — BASIC METABOLIC PANEL
Anion gap: 14 (ref 5–15)
BUN: 6 mg/dL (ref 6–20)
CO2: 24 mmol/L (ref 22–32)
Calcium: 9.1 mg/dL (ref 8.9–10.3)
Chloride: 102 mmol/L (ref 98–111)
Creatinine, Ser: 0.77 mg/dL (ref 0.61–1.24)
GFR, Estimated: >60 mL/min (ref >60)
Glucose, Bld: 91 mg/dL (ref 70–99)
Potassium: 4.2 mmol/L (ref 3.5–5.1)
Sodium: 140 mmol/L (ref 135–145)

## 2023-01-13 LAB — VITAMIN B12: Vitamin B-12: 582 pg/mL (ref 180–914)

## 2023-01-13 MED ORDER — CLONAZEPAM 0.25 MG PO TBDP
0.2500 mg | ORAL_TABLET | Freq: Two times a day (BID) | ORAL | Status: DC
  Administered 2023-01-13 – 2023-01-15 (×4): 0.25 mg via ORAL
  Filled 2023-01-13 (×5): qty 1

## 2023-01-13 NOTE — Progress Notes (Signed)
Speech Language Pathology Daily Session Note  Patient Details  Name: Shawn Mills MRN: [id number] Date of Birth: Jun 28, 1981  Today's Date: 01/13/2023 SLP Individual Time: 1115-1200 SLP Individual Time Calculation (min): 45 min  Short Term Goals: Week 2: SLP Short Term Goal 1 (Week 2): Pt will utilize external aids to answer orientation questions Min verbal cues. SLP Short Term Goal 2 (Week 2): Patient will demonstrate sustained attention to functional tasks for 15 minutes with Mod verbal cues for redirection. SLP Short Term Goal 3 (Week 2): Patient will demonstrate functional problem solving for basic and familiar tasks with Min verbal cues. SLP Short Term Goal 4 (Week 2): Patient will utilize word-finding strategies during structured language tasks with Mod verbal cues.  Skilled Therapeutic Interventions:  Pt seen for skilled speech therapy targeting cognition goals. Pt sitting upright in enclosure bed, reporting 10/10 R knee pain. Nurse is aware, stated he already received pain meds. SLP facilitated therapuetic tasks to increase use of external memory aids. Pt oriented to city but not to location, required min verbal cues to use sign on the wall to orient himself. Oriented himself to the correct date using a calendar with 100% acc independently and was oriented to month and year. Pt repeatedly asking to be let out of enclosure bed but this clinician was not familiar with him and therefore, did not. Pt perseverating on "being f**cking done" throughout the session but was unable to express effectively what he did not want to do. Pt continues to endorse word finding difficulty but required mod verbal cues to use strategies due to needing redirection. During discussion re: dc plan, pt had poor awareness of plan/deficits and perseverated on his plan to give assets to others. Required frequent redirection throughout session. Pt requested to use the restroom and SLP provided assistance after confirming  with NT that he could be let out of the enclosure bed. Pt left in enclosure bed with call bell in reach.   Pain Pain Assessment Pain Scale: 0-10 Pain Score: 10-Worst pain ever Pain Location: Knee Pain Orientation: Right Pain Descriptors / Indicators: Aching Pain Onset: On-going Patients Stated Pain Goal: 0 Pain Intervention(s): Medication (See eMAR) Multiple Pain Sites: No  Therapy/Group: Individual Therapy  Alphonsus Sias 01/13/2023, 12:06 PM

## 2023-01-13 NOTE — Progress Notes (Signed)
Occupational Therapy TBI Note  Patient Details  Name: Shawn Mills MRN: [id number] Date of Birth: Dec 01, 1980  Today's Date: 01/13/2023 OT Individual Time: 0950-1030 OT Individual Time Calculation (min): 40 min    Short Term Goals: Week 2:  OT Short Term Goal 1 (Week 2): Pt will complete full ADL routine with no more than min cueing for safety OT Short Term Goal 2 (Week 2): Pt will reduce agitation as evidenced by 3 session with ABS <28 OT Short Term Goal 3 (Week 2): Pt will demo improved orientation via external cues, identifying place and time with no more than min cueing  Skilled Therapeutic Interventions/Progress Updates:    Pt received supine with pain un-rated but reported in his knee as "very bad", agreeable to OT session. His gait was quite antalgic 2/2 this knee pain. Asked LPN about pain medication availability- none available. Functional mobility 100 ft to the gym with close (S), would prefer CGA but pt very resistant to OT having hands on. He transferred to the NuStep- his preference, and completed 10 min of reciprocal stepping at level 8 resistance with BUE/BLE. Completed to increase tolerance to OT presence and intervention with reduced agitation, increased functional activity tolerance, and increasing BLE flexibility. He was oriented to self, general time, and place. He was perseverative on pain and going home throughout session but was overall redirected. He returned to his room for a shower for pain relief of his back and knee. He required min A for balance support when removing pants in standing d/t R knee buckling/unsteady. He completed bathing in standing with close CGA- (S), requiring OT cueing to terminate shower d/t B knee instability. He dressed with close (S) and completed oral care in standing before returning to the enclosure bed without cueing.  Pt left secured in enclosure bed.    Therapy Documentation Precautions:  Precautions Precautions: Fall Precaution  Comments: enclosure bed Restrictions Weight Bearing Restrictions: No  Agitated Behavior Scale: TBI Observation Details Observation Environment: [facility] Start of observation period - Date: 01/13/23 Start of observation period - Time: 0945 End of observation period - Date: 01/13/23 End of observation period - Time: 1030 Agitated Behavior Scale (DO NOT LEAVE BLANKS) Short attention span, easy distractibility, inability to concentrate: Present to a slight degree Impulsive, impatient, low tolerance for pain or frustration: Present to a slight degree Uncooperative, resistant to care, demanding: Absent Violent and/or threatening violence toward people or property: Absent Explosive and/or unpredictable anger: Absent Rocking, rubbing, moaning, or other self-stimulating behavior: Absent Pulling at tubes, restraints, etc.: Absent Wandering from treatment areas: Present to a slight degree Restlessness, pacing, excessive movement: Absent Repetitive behaviors, motor, and/or verbal: Present to a slight degree Rapid, loud, or excessive talking: Present to a moderate degree Sudden changes of mood: Absent Easily initiated or excessive crying and/or laughter: Absent Self-abusiveness, physical and/or verbal: Absent Agitated behavior scale total score: 20   Therapy/Group: Individual Therapy  Crissie Reese 01/13/2023, 11:02 AM
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
## 2023-01-14 DIAGNOSIS — M79604 Pain in right leg: Secondary | ICD-10-CM

## 2023-01-14 NOTE — Progress Notes (Signed)
Attempting to cluster medication giving medication at 2130. Patient falling asleep by 2030 night prior and falling asleep earlier than the night prior this evening. Difficulty being fully awake. Disoriented able to take oral medication. However, dismissive of taking Lovenox due to being administered subcutaneous and not wanting a needle. Demonstrating irritability and non-compliance. Able to be verbally redirected, but falling back to sleep at this time.
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
## 2023-01-14 NOTE — Progress Notes (Signed)
Physical Therapy TBI Note  Patient Details  Name: Kirklan Bueno MRN: [id number] Date of Birth: 1980/09/17  Today's Date: 01/14/2023 PT Individual Time: 1400-1440 PT Individual Time Calculation (min): 40 min   Short Term Goals: Week 2:  PT Short Term Goal 1 (Week 2): STGs=LTGs secondary to ELOS  Skilled Therapeutic Interventions/Progress Updates:    Chart reviewed and pt agreeable to therapy. Pt received seated in enclosure bed with RN prsent for medication administeration with 9/10 c/o pain in R hamstring area. Session focused on amb quality and endurance to rpomote safe home mobility and RLE stretching to promote reduction in pain. Pt initiated session with amb to therapy gym using S. Pt then completed 12 mins on NuStep at different workload levels for endurance training. Pt then amb >1052ft around therapy floor with S. Pt c/o continued pain. Pt sat EOB and completed B hamstring stretches with instruction from PT. At end of session, pt was left seated in enclosure bed with alarm engaged, nurse call bell and all needs in reach.   Therapy Documentation Precautions:  Precautions Precautions: Fall Precaution Comments: enclosure bed Restrictions Weight Bearing Restrictions: No  Agitated Behavior Scale: TBI Observation Details Observation Environment: CIR Start of observation period - Date: 01/14/23 Start of observation period - Time: 1400 End of observation period - Date: 01/14/23 End of observation period - Time: 1440 Agitated Behavior Scale (DO NOT LEAVE BLANKS) Short attention span, easy distractibility, inability to concentrate: Present to a slight degree Impulsive, impatient, low tolerance for pain or frustration: Present to a slight degree Uncooperative, resistant to care, demanding: Absent Violent and/or threatening violence toward people or property: Absent Explosive and/or unpredictable anger: Absent Rocking, rubbing, moaning, or other self-stimulating behavior:  Absent Pulling at tubes, restraints, etc.: Absent Wandering from treatment areas: Absent Restlessness, pacing, excessive movement: Absent Repetitive behaviors, motor, and/or verbal: Present to a slight degree Rapid, loud, or excessive talking: Absent Sudden changes of mood: Absent Easily initiated or excessive crying and/or laughter: Absent Self-abusiveness, physical and/or verbal: Absent Agitated behavior scale total score: 17    Therapy/Group: Individual Therapy  Dionne Milo, PT, DPT 01/14/2023, 4:03 PM

## 2023-01-15 DIAGNOSIS — S066X0A Traumatic subarachnoid hemorrhage without loss of consciousness, initial encounter: Secondary | ICD-10-CM

## 2023-01-15 LAB — BASIC METABOLIC PANEL
Anion gap: 9 (ref 5–15)
BUN: 5 mg/dL — ABNORMAL LOW (ref 6–20)
CO2: 25 mmol/L (ref 22–32)
Calcium: 8.8 mg/dL — ABNORMAL LOW (ref 8.9–10.3)
Chloride: 104 mmol/L (ref 98–111)
Creatinine, Ser: 0.86 mg/dL (ref 0.61–1.24)
GFR, Estimated: >60 mL/min (ref >60)
Glucose, Bld: 94 mg/dL (ref 70–99)
Potassium: 3.7 mmol/L (ref 3.5–5.1)
Sodium: 138 mmol/L (ref 135–145)

## 2023-01-15 LAB — CBC
HCT: 40.3 % (ref 39.0–52.0)
Hemoglobin: 13.7 g/dL (ref 13.0–17.0)
MCH: 32.9 pg (ref 26.0–34.0)
MCHC: 34 g/dL (ref 30.0–36.0)
MCV: 96.9 fL (ref 80.0–100.0)
Platelets: 369 10*3/uL (ref 150–400)
RBC: 4.16 MIL/uL — ABNORMAL LOW (ref 4.22–5.81)
RDW: 12.3 % (ref 11.5–15.5)
WBC: 5.7 10*3/uL (ref 4.0–10.5)
nRBC: 0 % (ref 0.0–0.2)

## 2023-01-15 MED ORDER — CLONAZEPAM 0.125 MG PO TBDP
0.1250 mg | ORAL_TABLET | Freq: Two times a day (BID) | ORAL | Status: AC
  Administered 2023-01-15 – 2023-01-17 (×4): 0.125 mg via ORAL
  Filled 2023-01-15 (×4): qty 1

## 2023-01-15 MED ORDER — BACLOFEN 10 MG PO TABS
10.0000 mg | ORAL_TABLET | Freq: Three times a day (TID) | ORAL | Status: DC
  Administered 2023-01-15 – 2023-01-16 (×2): 10 mg via ORAL
  Filled 2023-01-15 (×2): qty 1

## 2023-01-15 MED ORDER — AMITRIPTYLINE HCL 25 MG PO TABS
50.0000 mg | ORAL_TABLET | Freq: Every day | ORAL | Status: DC
  Administered 2023-01-15 – 2023-01-18 (×4): 50 mg via ORAL
  Filled 2023-01-15 (×3): qty 2

## 2023-01-15 NOTE — Progress Notes (Signed)
Awake with medication administration this evening. Mood improved from night prior. Is alert and oriented x 4 during interaction. Endorsing pain in right leg. Talked about any interventions being effective in relieving pain describing, which patient denies any interventions being effective. Patient explaining where pain is located. Assessing right leg and from where patient endorses pain starts from TFL area running on the outside area of the thigh following the iliotibial band down the right leg. Some swelling felt in comparison to left leg. Reports pain becomes worse with extension of right leg. Scheduled topical ointment applied to area where patient identifies area causing pain.

## 2023-01-15 NOTE — Progress Notes (Signed)
Physical Therapy TBI Note  Patient Details  Name: Shawn Mills MRN: [id number] Date of Birth: 1980/09/27  Today's Date: 01/15/2023 PT Individual Time: 1002-1100 PT Individual Time Calculation (min): 58 min  and Today's Date: 01/15/2023 PT Missed Time: 60 Minutes Missed Time Reason: Pain;Patient unwilling to participate  Short Term Goals: Week 2:  PT Short Term Goal 1 (Week 2): STGs=LTGs secondary to ELOS  Skilled Therapeutic Interventions/Progress Updates:     1st Session: Pt received seated in enclosure bed, profanely exclaiming how bad Rt knee hurts. PT offers voltarin gel for pain relief and pt is agreeable. PT applies to Rt knee and distal quad. RN notified. Pt performs all mobility with supervision and cues for safety due to increased postural sway and poor attention. Pt ambulates to dayroom without AD. Pt completes Nustep for endurance training, challenging attention to task, and for gentle mobility to provide pain relief Rt knee. PT provides cues for hand and foot placement and completing full available ROM.  Pt takes seated rest rbeak. Pt then participates in BITS system activity for NMR and attention to task. Pt initially performs [employer] Task and is able to complete with 70% accuracy with min cues for remaining on task. Pt then completes short term memory task with sequentially increasing word-to-picture location challenge. Pt able to complete up to 3-word sequence with consistent accuracy but has difficulty with >3 words. Pt completes in both sitting and standing due to fatigue and pain in Rt leg.   Pt ambulates 2x150' with seated rest break. Pt left supine in enclosure bed with call bell in reach.   2nd Session: Pt received supine in enclosure bed. Pt reports that he does not "feel like getting up right now". PT encourages participation but pt continues to decline. PT will follow up as able.   Therapy Documentation Precautions:  Precautions Precautions: Fall Precaution  Comments: enclosure bed Restrictions Weight Bearing Restrictions: No  Agitated Behavior Scale: TBI Observation Details Observation Environment: CIR Start of observation period - Date: 01/15/23 Start of observation period - Time: 0700 End of observation period - Date: 01/15/23 End of observation period - Time: 0730 Agitated Behavior Scale (DO NOT LEAVE BLANKS) Short attention span, easy distractibility, inability to concentrate: Present to a slight degree Impulsive, impatient, low tolerance for pain or frustration: Present to a slight degree Uncooperative, resistant to care, demanding: Absent Violent and/or threatening violence toward people or property: Absent Explosive and/or unpredictable anger: Absent Rocking, rubbing, moaning, or other self-stimulating behavior: Absent Pulling at tubes, restraints, etc.: Absent Wandering from treatment areas: Absent Restlessness, pacing, excessive movement: Absent Repetitive behaviors, motor, and/or verbal: Present to a slight degree Rapid, loud, or excessive talking: Absent Sudden changes of mood: Absent Easily initiated or excessive crying and/or laughter: Absent Self-abusiveness, physical and/or verbal: Absent Agitated behavior scale total score: 17   Therapy/Group: Individual Therapy  Beau Fanny, PT, DPT 01/15/2023, 4:57 PM

## 2023-01-15 NOTE — Progress Notes (Signed)
Occupational Therapy TBI Note  Patient Details  Name: Shawn Mills MRN: [id number] Date of Birth: 11/27/1980  Today's Date: 01/15/2023 OT Individual Time: 1330-1430 OT Individual Time Calculation (min): 60 min    Short Term Goals: Week 1:  OT Short Term Goal 1 (Week 1): Patient will recall that he is in the hospital with min quetsioning cues. OT Short Term Goal 1 - Progress (Week 1): Met OT Short Term Goal 2 (Week 1): Patient will demonstrate improved safety awareness by sitting down to don pants with min questioning cues. OT Short Term Goal 2 - Progress (Week 1): Met OT Short Term Goal 3 (Week 1): Patient will maintain standing balance at the sink with CGA during functional BADL tasks. OT Short Term Goal 3 - Progress (Week 1): Met Week 2:  OT Short Term Goal 1 (Week 2): Pt will complete full ADL routine with no more than min cueing for safety OT Short Term Goal 2 (Week 2): Pt will reduce agitation as evidenced by 3 session with ABS <28 OT Short Term Goal 3 (Week 2): Pt will demo improved orientation via external cues, identifying place and time with no more than min cueing   Skilled Therapeutic Interventions/Progress Updates:    Pt received in veil bed and agreeable to therapy.  Pt stated " I do not know why I have this crazy bed. I feel like I am 42 yo".  Reassured pt it was to ensure his safety.   Pt exited bed and used bathroom and then washed hands without cues or A.  He was able to state current day, month and year with a quick glance at calendar on wall. He initially had difficulty indicating specifically where he was (he knew he was in [place]) but glanced at wall sign to tell me he was at [facility].  Pt declined showering at this time.    Pt talked about his painful R hip and was visibly limping as he ambulated to the gym.  Had pt get on the mat and do both seated and supine hip stretches such as figure 4 and knee pulls.  He was able to do all stretches but did not feel this was  helping with the discomfort.    Worked on balance and direction following with a circuit style of exercise using a 10 lb dowel bar with squats (pt able to tolerate),  chest presses, elbow curls to build up endurance lost during his hospital stay. Pt had fair recall of exercises but was impulsive with standing up and walking around but also compliant with redirection.    Had pt engage in a standing task of a cognitive game with problem solving Beau Fanny) with min cues for correct placement.  Pt participated well first round but on 2nd and 3rd rounds he was getting impatient and starting to  curse talking about his painful hip.  Switched to sitting but hip still bothering him. Pt apologized several times for cursing.  Pt ambulated back to his room and agreeable got back into enclosure bed.  Call light and phone in reach.    Therapy Documentation Precautions:  Precautions Precautions: Fall Precaution Comments: enclosure bed Restrictions Weight Bearing Restrictions: No Therapy Vitals Temp: 98.6 F (37 C) Pulse Rate: 80 Resp: 17 BP: 111/73 Patient Position (if appropriate): Sitting Oxygen Therapy SpO2: 99 % O2 Device: Room Air Pain:  Pt c/o pain in upper R hamstring area.  RN aware.  Pt tolerated most of the session but towards the end  he was getting irritable due to the pain.  Agitated Behavior Scale: TBI Observation Details Observation Environment: CIR Start of observation period - Date: 01/15/23 Start of observation period - Time: 1330 End of observation period - Date: 01/15/23 End of observation period - Time: 1430 Agitated Behavior Scale (DO NOT LEAVE BLANKS) Short attention span, easy distractibility, inability to concentrate: Present to a slight degree Impulsive, impatient, low tolerance for pain or frustration: Present to a slight degree Uncooperative, resistant to care, demanding: Absent Violent and/or threatening violence toward people or property: Absent Explosive and/or  unpredictable anger: Absent Rocking, rubbing, moaning, or other self-stimulating behavior: Absent Pulling at tubes, restraints, etc.: Absent Wandering from treatment areas: Absent Restlessness, pacing, excessive movement: Absent Repetitive behaviors, motor, and/or verbal: Absent Rapid, loud, or excessive talking: Absent Sudden changes of mood: Absent Easily initiated or excessive crying and/or laughter: Absent Self-abusiveness, physical and/or verbal: Absent Agitated behavior scale total score: 16     Therapy/Group: Individual Therapy  Rayquon Uselman 01/15/2023, 3:52 PM
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
## 2023-01-15 NOTE — Progress Notes (Signed)
Speech Language Pathology TBI Note  Patient Details  Name: Shawn Mills MRN: [id number] Date of Birth: 09-08-1980  Today's Date: 01/15/2023 SLP Individual Time: 0700-0730 SLP Individual Time Calculation (min): 30 min  Short Term Goals: Week 2: SLP Short Term Goal 1 (Week 2): Pt will utilize external aids to answer orientation questions Min verbal cues. SLP Short Term Goal 2 (Week 2): Patient will demonstrate sustained attention to functional tasks for 15 minutes with Mod verbal cues for redirection. SLP Short Term Goal 3 (Week 2): Patient will demonstrate functional problem solving for basic and familiar tasks with Min verbal cues. SLP Short Term Goal 4 (Week 2): Patient will utilize word-finding strategies during structured language tasks with Mod verbal cues.  Skilled Therapeutic Interventions: Skilled treatment session focused on cognitive goals. Upon arrival, patient was awake while upright in the enclosure bed. SLP facilitated session by providing overall Mod A verbal and visual cues for use of external aids for orientation to place and date. Patient was Mod I for tray set-up and divided attention between self-feeding and a functional conversation that focused on recall of events from the weekend independently. Patient continues to demonstrate improved recall of daily information but details continue to be intermittently confused and confabulated. Patient ambulated to the linen closure to retrieve linens for Shawn Mills bed with Max verbal cues needed for recall of items he needed. Patient assisted in changing Shawn Mills linens with Min verbal cues for attention to tasks. Patient left upright while secured in the enclosure bed with all needs within reach. Continue with current plan of care.      Pain Pain in right leg Nursing aware, patient repositioned and provided distraction   Agitated Behavior Scale: TBI Observation Details Observation Environment: CIR Start of observation period - Date:  01/15/23 Start of observation period - Time: 0700 End of observation period - Date: 01/15/23 End of observation period - Time: 0730 Agitated Behavior Scale (DO NOT LEAVE BLANKS) Short attention span, easy distractibility, inability to concentrate: Present to a slight degree Impulsive, impatient, low tolerance for pain or frustration: Present to a slight degree Uncooperative, resistant to care, demanding: Absent Violent and/or threatening violence toward people or property: Absent Explosive and/or unpredictable anger: Absent Rocking, rubbing, moaning, or other self-stimulating behavior: Absent Pulling at tubes, restraints, etc.: Absent Wandering from treatment areas: Absent Restlessness, pacing, excessive movement: Absent Repetitive behaviors, motor, and/or verbal: Present to a slight degree Rapid, loud, or excessive talking: Absent Sudden changes of mood: Absent Easily initiated or excessive crying and/or laughter: Absent Self-abusiveness, physical and/or verbal: Absent Agitated behavior scale total score: 17  Therapy/Group: Individual Therapy  Yashira Offenberger 01/15/2023, 10:11 AM

## 2023-01-16 MED ORDER — BACLOFEN 5 MG HALF TABLET
15.0000 mg | ORAL_TABLET | Freq: Three times a day (TID) | ORAL | Status: DC
  Administered 2023-01-16 – 2023-01-23 (×21): 15 mg via ORAL
  Filled 2023-01-16 (×21): qty 1

## 2023-01-16 MED ORDER — LIDOCAINE 5 % EX PTCH
2.0000 | MEDICATED_PATCH | CUTANEOUS | Status: DC
  Administered 2023-01-16 – 2023-01-18 (×3): 2 via TRANSDERMAL
  Filled 2023-01-16 (×3): qty 2

## 2023-01-16 MED ORDER — QUETIAPINE FUMARATE 50 MG PO TABS
50.0000 mg | ORAL_TABLET | Freq: Every day | ORAL | Status: DC
  Administered 2023-01-16: 50 mg via ORAL
  Filled 2023-01-16: qty 1

## 2023-01-16 NOTE — Progress Notes (Addendum)
Speech Language Pathology TBI Note  Patient Details  Name: Shawn Mills MRN: [id number] Date of Birth: 05/17/1981  Today's Date: 01/16/2023 SLP Individual Time: 0825-0925 SLP Individual Time Calculation (min): 60 min  Short Term Goals: Week 2: SLP Short Term Goal 1 (Week 2): Pt will utilize external aids to answer orientation questions Min verbal cues. SLP Short Term Goal 2 (Week 2): Patient will demonstrate sustained attention to functional tasks for 15 minutes with Mod verbal cues for redirection. SLP Short Term Goal 3 (Week 2): Patient will demonstrate functional problem solving for basic and familiar tasks with Min verbal cues. SLP Short Term Goal 4 (Week 2): Patient will utilize word-finding strategies during structured language tasks with Mod verbal cues.  Skilled Therapeutic Interventions:    Pt greeted upright in enclosure bed. SLP assisted pt to edge of bed and tx session completed @ [facility]. Upon SLP arrival, pt reported 10/10 back pain but was able to recall that his RN just provided him w/ pain medication. Intermittent profanities noted re his overall situation and other staff members i.e. "fuck that guy" "fuck this shit," though overall pleasant and cooperative w/ SLP. Confabulation continued and SLP utilized errorless learning/passive reorientation to ID reason for stay, length of stay, and recent medical events. During initial conversation, pt noted to perseverate on discharge tomorrow despite passive education from SLP. He benefited from totalA verbal cues to ID current cognitive-linguistic deficits and their potential negative impact on his return to prev roles/responsibilities. 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 He benefited from [facility] verbal cues for information processing, organization, and attention; though calculations 100% accurate. He also completed a mildly complex structured organization/attention task  (TV guide) w/ minA verbal cues for attention to detail and impulsivity. Noted to sustain attention for ~10 mins w/ spv verbal cues despite mildly distracting environment. SLP provided education re TBI, overstimulation, safety awareness and remaining cognitive-linguistic deficits. Recommend cont ST per POC. Pt left secured in enclosure bed; call light within reach.   Pain  10/10 back pain reported,  though nursing provided pain medication prior to SLP arrival.   Agitated Behavior Scale: TBI Observation Details Observation Environment: CIR Start of observation period - Date: 01/16/23 Start of observation period - Time: 0825 End of observation period - Date: 01/16/23 End of observation period - Time: 0925 Agitated Behavior Scale (DO NOT LEAVE BLANKS) Short attention span, easy distractibility, inability to concentrate: Present to a slight degree Impulsive, impatient, low tolerance for pain or frustration: Present to a slight degree Uncooperative, resistant to care, demanding: Absent Violent and/or threatening violence toward people or property: Absent Explosive and/or unpredictable anger: Absent Rocking, rubbing, moaning, or other self-stimulating behavior: Absent Pulling at tubes, restraints, etc.: Absent Wandering from treatment areas: Absent Restlessness, pacing, excessive movement: Absent Repetitive behaviors, motor, and/or verbal: Present to a slight degree Rapid, loud, or excessive talking: Absent Sudden changes of mood: Absent Easily initiated or excessive crying and/or laughter: Absent Self-abusiveness, physical and/or verbal: Absent Agitated behavior scale total score: 17  Therapy/Group: Individual Therapy  Pati Gallo, M.S. CCC-SLP 01/16/2023, 12:09 PM

## 2023-01-16 NOTE — Patient Care Conference (Signed)
Inpatient RehabilitationTeam Conference and Plan of Care Update Date: 01/16/2023   Time: 10:07 AM   Patient Name: Shawn Mills      Medical Record Number: [id number]  Date of Birth: 10-25-1980 Sex: Male         Room/Bed: 4W20C/4W20C-01 Payor Info: Payor: [employer] / Plan: [employer] [facility]  / Product Type: *No Product type* /    Admit Date/Time:  01/04/2023  6:20 PM  Primary Diagnosis:  TBI (traumatic brain injury) [facility])  Hospital Problems: Principal Problem:   TBI (traumatic brain injury) [facility])    Expected Discharge Date: Expected Discharge Date: 01/23/23  Team Members Present: Physician leading conference: Dr. Elijah Birk Social Worker Present: Cecile Sheerer, LCSWA Nurse Present: Vedia Pereyra, RN PT Present: Malachi Pro, PT OT Present: Jake Shark, OT SLP Present: Feliberto Gottron, SLP PPS Coordinator present : Fae Pippin, SLP     Current Status/Progress Goal Weekly Team Focus  Bowel/Bladder   cont of B&B   remain cont B&B   assist with toileting PRN    Swallow/Nutrition/ Hydration               ADL's   Close supervision with shower and dressing with occasional CGA. distant S with toileting and toilet transfers.  improving behavior with occasional episodes of irritation more than agitation.  R hip/ knee pain limits some of his mobility.  Continues to perseverate on going home.   Supervision overall. Will have support of SO, her daughter and mother   ADL training, dynamic balance, cognitive activities, family/pt education    Mobility   supervision all mobility   supervision  Family ed, cognition, high level balance, safety awareness, DC prep    Communication                Safety/Cognition/ Behavioral Observations  Rancho Level VI: Improved recall of daily information but some confusion regarding details remains. Improved attention to tasks and orientation to place, time and situation. Also less perseveration noted with improved cooperation ,etc.    Min A   increase use of memory compensatory strategies, functional problem solving, improved awareness of deficits and their impact on his function and safety    Pain   pain to R knee   decrease pain in r knee   assess pain qshift and PRN    Skin    Skin is intact   Skin will remain intact  Assess every shift and PRN for changes     Discharge Planning:  Plan remains to discharge to home with his fiance and support from her dtr, and his mother. Fam edu oending Wed (7/10) with fiance and mother. SW will confirm there are no barriers to discharge.   Team Discussion: TBI. Rancho VI. Behavior Plan. Enclosure Bed. Tele-sitter. Sleep chart for sleep/wake cycle. Increase in pain. Reports Voltaren gel doesn't help with pain. Participates more in therapy with occasional LOB. Able to recall more details from daily activities and is less perseverative with improved attention.  Patient on target to meet rehab goals: yes, continues to progress towards goals with discharge date of 01/23/23  *See Care Plan and progress notes for long and short-term goals.   Revisions to Treatment Plan:  Scheduled Robaxin. Elavil increased. Monitor labs/VS Teaching Needs: Medications, safety, self care, gait/transfer training, cognition/behavior, etc.   Current Barriers to Discharge: Decreased caregiver support and Behavior  Possible Resolutions to Barriers: Family education Self monitor behaviors/no alcohol Order recommended DME if needed.      Medical Summary  Current Status: medically complicated bu cognitive deficits, behaviors/mood, poor sleep, pain RLE, facial fractures, poor PO intakes  Barriers to Discharge: Behavior/Mood;Inadequate Nutritional Intake;Medical stability;Uncontrolled Pain;Spasticity;Self-care education  Barriers to Discharge Comments: right leg pain, mood/behavioral difficulty, poor cognitiom Possible Resolutions to [employer] Focus: wean sedating medications as tolerated,  reorientation, pain managemnt   Continued Need for Acute Rehabilitation Level of Care: The patient requires daily medical management by a physician with specialized training in physical medicine and rehabilitation for the following reasons: Direction of a multidisciplinary physical rehabilitation program to maximize functional independence : Yes Medical management of patient stability for increased activity during participation in an intensive rehabilitation regime.: Yes Analysis of laboratory values and/or radiology reports with any subsequent need for medication adjustment and/or medical intervention. : Yes   I attest that I was present, lead the team conference, and concur with the assessment and plan of the team.   Jearld Adjutant 01/16/2023, 1:47 PM
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
## 2023-01-16 NOTE — Progress Notes (Signed)
Occupational Therapy TBI Note  Patient Details  Name: Shawn Mills MRN: [id number] Date of Birth: 05/27/1981  Session 1 Today's Date: 01/16/2023 OT Individual Time: 4098-1191 OT Individual Time Calculation (min): 33 min   Session 2 Today's Date: 01/16/2023 OT Individual Time: 1436-1510 OT Individual Time Calculation (min): 34 min    Short Term Goals: Week 2:  OT Short Term Goal 1 (Week 2): Pt will complete full ADL routine with no more than min cueing for safety OT Short Term Goal 2 (Week 2): Pt will reduce agitation as evidenced by 3 session with ABS <28 OT Short Term Goal 3 (Week 2): Pt will demo improved orientation via external cues, identifying place and time with no more than min cueing  Skilled Therapeutic Interventions/Progress Updates:    Session 1 Pt received sitting cross legged in the enclosure bed with un-rated pain in his R knee and hip, agreeable to shower as pain intervention. He completed functional mobility around room with close (S), gait antalgic 2/2 pain. He required several instances of CGA, up to min A for staggering LOB d/t knee pain with prolonged standing in the shower. He completed bathing in standing with min-mod cueing for safety awareness. Large LOB when donning pants in standing. He completed 100 ft of functional mobility to the therapy gym with (S). Gentle thoracic stretching provided, including moving in/out of several yoga positions with OT demonstration for both chronic back pain management and for stress management. He returned to his room. He requested to sit in the recliner and OT notified nursing of trial to determine readiness for enclosure bed removal. He was left sitting up with the telesitter on and chair alarm set.     Session 2 Pt received sitting in the recliner with un-rated knee and hip pain, LPN administering pain medication and lidocaine patches during session for pain relief. Provided rest breaks throughout session as well. Functional  mobility to the gym with (S), 100 ft. He attempted standing balance activity but could not tolerate. He transitioned into prone on the mat for posterior chain strengthening- including superman's and closed chain push ups. Pt completed 3x10 repetitions. Carryover to improved stability during dynamic balance tasks for increased independence during ADL transfers. He returned to his room. Pt was left sitting up in the recliner with all needs met, chair alarm set, and call bell within reach.   ABS 17   Therapy Documentation Precautions:  Precautions Precautions: Fall Precaution Comments: enclosure bed Restrictions Weight Bearing Restrictions: No  Agitated Behavior Scale: TBI Observation Details Observation Environment: [facility] Start of observation period - Date: 01/16/23 Start of observation period - Time: 1130 End of observation period - Date: 01/16/23 End of observation period - Time: 1200 Agitated Behavior Scale (DO NOT LEAVE BLANKS) Short attention span, easy distractibility, inability to concentrate: Present to a slight degree Impulsive, impatient, low tolerance for pain or frustration: Present to a slight degree Uncooperative, resistant to care, demanding: Absent Violent and/or threatening violence toward people or property: Absent Explosive and/or unpredictable anger: Absent Rocking, rubbing, moaning, or other self-stimulating behavior: Absent Pulling at tubes, restraints, etc.: Absent Wandering from treatment areas: Absent Restlessness, pacing, excessive movement: Absent Repetitive behaviors, motor, and/or verbal: Present to a slight degree Rapid, loud, or excessive talking: Absent Sudden changes of mood: Absent Easily initiated or excessive crying and/or laughter: Absent Self-abusiveness, physical and/or verbal: Absent Agitated behavior scale total score: 17    Therapy/Group: Individual Therapy  Crissie Reese 01/16/2023, 12:15 PM

## 2023-01-16 NOTE — Progress Notes (Signed)
Patient ID: Shawn Mills, male   DOB: 03/16/1981, 42 y.o.   MRN: [id number]  [phone]- SW spoke with pt fiance Melissa to provide updates from team conference, and d/c date remains 7/16. Confirms family edu tomorrow 8am-12pm. SW encouraged her to check with nursing about therapy schedule to see what time therapies will begin. She asks for a letter for his employer that outlines his medical condition and the reason he is unable to return to work before they will begin to assist with helping he get benefits. She would also like follow-up about the x-ray reports on his R knee. Confirms there will be support from pt mother and father, and her dtr rotating assistance to help with care. SW informed medical team.   Cecile Sheerer, MSW, LCSWA Office: [phone] Cell: [phone] Fax: [phone]

## 2023-01-16 NOTE — Progress Notes (Signed)
Physical Therapy TBI Note  Patient Details  Name: Shawn Mills MRN: [id number] Date of Birth: 05-18-1981  Today's Date: 01/16/2023 PT Individual Time: 4098-1191 and 4782-9562 PT Individual Time Calculation (min): 43 min and 42 min  Short Term Goals: Week 2:  PT Short Term Goal 1 (Week 2): STGs=LTGs secondary to ELOS  Skilled Therapeutic Interventions/Progress Updates:     Pt received supine in enclosure bed and agrees to therapy. Reports pain that "starts from where my spine meets my buttcrack and goes all the way down my right leg." PT provides soft tissue stretching as well as movement and positioning based pain relief throughout session to address pain. Pt performs all mobility with supervision for safety, with no LOBs noted. Pt ambulates to gym and transitions to supine. PT provides PROM to pt's lower extremities. Initially RLE brought into end range hip and knee flexion for passive stretch of gluteal muscles and low back musculature. PT provides gentle overpressure at end range and holds for 2-3 minutes on RLE, then performs the same with LLE. Pt then transitions to prone and performs low grade back extension, pressing up onto elbows and returning prone x10 with 5 second hold at top. Pt rolls to prone with cues for sequencing. Pt then completes AAROM hold of BLEs into chest for self directed soft tissue stretch. Pt then performs core and low back strengthening, tasked with holding BLEs at 90 degrees of hip and knee flexion in prone, then tapping alternating heels on mat then returning to start position, with emphasis on maintaining low back contact with mat throughout motion. Pt performs piriformis/figure 4 stretch with PT directing motion and positioning for optimal performance. Pt completes 2x2:00 on each leg. Pt ambulates back to room. Left in enclosure bed with call bell in reach.    2nd Session: Pt received seated in recliner with alarm belt intact. Pt agrees to therapy. Reports pain in low  back and RLE. PT provides rest breaks as needed and mobility to manage pain. Pt ambulates to gym without requiring assistance. Pt then performs balance challenge, tasked with tapping colored cones with either LLE or RLE, depending on PT cue. Pt able to consistently perform 1-step commands without error or assistance. Pt completes 2 step commands correctly >75% of time, and 3 step commands ~50% of time. Pt requires occasional minA when completing 2 and 3 step commands and having to balance on RLE, due to slight LOBs. Pt attempts balance activity on Wii balance board but does not tolerate activity well, with limited patience and attention to task. Activity adjusted for patient preference. Pt tasked with dribbling basketball while ambulating to challenge coordination and multitasking. Pt again has limited patience for activity and requests to stop. Activity adjusted again, with PT and pt tossing 4lb medicine ball back and forth to challenge coordination, anticipatory postural adjustments, and endurance. Pt cued to toss ball overhead for maximal core engagement and balance challenge. Pt tosses ball >50 times prior to requiring rest break. Pt then completes Nustep x10:00 at workload of 7 with average step per minute ~65. Completed for endurance training and challenging attention to task. PT provides cues for hand and foot placement and completing full available ROM, but pt able to maintain attention to task for entire 10:00 without cueing. Pt ambulates back to room. Pt voids bladder with supervision and ambulates to recliner. Left seated in recliner with alarm intact and all needs within reach.    Therapy Documentation Precautions:  Precautions Precautions: Fall Precaution Comments: enclosure  bed Restrictions Weight Bearing Restrictions: No  Agitated Behavior Scale: TBI Observation Details Observation Environment: [facility] Start of observation period - Date: 01/16/23 Start of observation period - Time:  1030 End of observation period - Date: 01/16/23 End of observation period - Time: 1115 Agitated Behavior Scale (DO NOT LEAVE BLANKS) Short attention span, easy distractibility, inability to concentrate: Present to a slight degree Impulsive, impatient, low tolerance for pain or frustration: Present to a slight degree Uncooperative, resistant to care, demanding: Absent Violent and/or threatening violence toward people or property: Absent Explosive and/or unpredictable anger: Absent Rocking, rubbing, moaning, or other self-stimulating behavior: Absent Pulling at tubes, restraints, etc.: Absent Wandering from treatment areas: Absent Restlessness, pacing, excessive movement: Absent Repetitive behaviors, motor, and/or verbal: Present to a slight degree Rapid, loud, or excessive talking: Absent Sudden changes of mood: Absent Easily initiated or excessive crying and/or laughter: Absent Self-abusiveness, physical and/or verbal: Absent Agitated behavior scale total score: 17    Therapy/Group: Individual Therapy  Beau Fanny, PT, DPT 01/16/2023, 4:34 PM

## 2023-01-17 MED ORDER — QUETIAPINE FUMARATE 50 MG PO TABS
50.0000 mg | ORAL_TABLET | Freq: Every evening | ORAL | Status: DC | PRN
  Filled 2023-01-17 (×2): qty 1

## 2023-01-17 MED ORDER — TRAZODONE HCL 50 MG PO TABS
100.0000 mg | ORAL_TABLET | Freq: Every day | ORAL | Status: DC
  Administered 2023-01-17 – 2023-01-22 (×6): 100 mg via ORAL
  Filled 2023-01-17 (×6): qty 2

## 2023-01-17 MED ORDER — DOCUSATE SODIUM 100 MG PO CAPS
100.0000 mg | ORAL_CAPSULE | Freq: Two times a day (BID) | ORAL | Status: DC
  Administered 2023-01-17 – 2023-01-23 (×13): 100 mg via ORAL
  Filled 2023-01-17 (×13): qty 1

## 2023-01-17 MED ORDER — POLYETHYLENE GLYCOL 3350 17 G PO PACK
17.0000 g | PACK | Freq: Every day | ORAL | Status: DC
  Administered 2023-01-19 – 2023-01-20 (×2): 17 g via ORAL
  Filled 2023-01-17 (×6): qty 1

## 2023-01-17 NOTE — Progress Notes (Deleted)
   01/10/23 0700  Behavioral Plan Guideline  [place] Level IV-Confused/Agitated  Behavior to decrease/eliminate Decrease verbal agitation with staff;Increase participation;Decrease impulsivity;Decrease elopement risk;Decrease restlessness  Changes to environment Lights on, blinds open during the day, off and closed at night  Minimize in room stimulation  Limit visitors to no more than 2 at a time  Interventions Enclosure bed  Recommendations for interactions with patient Remain calm and reduce environment stimulation with agitation;Redirect behaviors to alternate tasks/topics;Offer toileting with every interaction  In Attendance at Behavior Plan Meeting  OT;PT;RN;SLP

## 2023-01-17 NOTE — Progress Notes (Signed)
PROGRESS NOTE   Subjective/Complaints: No events overnight. Patient only ongoin complaint RLE pain.  Seen in family training, d/w partner and mother. Discussed removing patient from veil bed to posey alarm as he has not been impulsive last few days and veil bed is triggering aggitation; discussed with patient, he understands bed will be re-implemented if any safety concerns. He is agreeable.  GF states when patient fell, his right leg was caught and he was found with it elevated and internally rotated at a peculiar angle. Discussed Hx ER visits and MRI for RLE pain in 2022; patient and family agree this is not similar to that time.  PRN trazadone last 2 nights, with good effect.  Vitals stable LBM 7/7 per nursing.    ROS:  + Restlessness/Perseveration on leaving- improving but ongoing + Poor appetite-resolved + Right leg pain-- ongoing, multifactorial - diffuse neuropathic pain, localized R medial and posterior knee pain Limited due to cognition/perseveration, but denies CP, SOB, abd pain, n/v/d. As per HPI     Objective:   No results found. Recent Labs    01/15/23 0707  WBC 5.7  HGB 13.7  HCT 40.3  PLT 369    Recent Labs    01/15/23 0707  NA 138  K 3.7  CL 104  CO2 25  GLUCOSE 94  BUN 5*  CREATININE 0.86  CALCIUM 8.8*     Intake/Output Summary (Last 24 hours) at 01/17/2023 0910 Last data filed at 01/16/2023 1249 Gross per 24 hour  Intake 240 ml  Output --  Net 240 ml         Physical Exam: Vital Signs Blood pressure 103/66, pulse 70, temperature (!) 97.3 F (36.3 C), resp. rate 16, height 5\' 10"  (1.778 m), weight 92.1 kg, SpO2 98 %.  Constitutional: No acute distress. Appropriate appearance for age. Laying on [employer] in gym.  HENT: Atraumatic, normocephalic. +facial redness.  Resp: No respiratory distress. No accessory muscle usage.  Clear to auscultation bilaterally, on room air. Cardio: Well  perfused appearance.  No peripheral edema.  Regular rate and rhythm, no murmurs, rubs, gallops. Abdomen: Nondistended.  No tenderness, normoactive sounds. MsK: moves all extremities antigravity, able to sit in bed unassisted.   TTP throughout  right posterior paraspinals, PSIS, and SI joint; worst on R medial and posterior knee joint  No palpable deformity. + McMurray's for medial joint pain ; no increased gapping - Anterior drawer - Posterior drawer  Skin : + facial rash - unchanged  Neuro: + Memory deficit -improving, fully oriented today + Continues to perseverate on leaving -much better  + Confabulations -ongoing + Intermittent agitation- improving + Sensory deficit R lateral thigh and calf, into dorsal foot-hyperesthesia  Psych: Flattening affect, aggressive/aggitated mood intermittently when discussing home - otherwise much more pleasant and reasonable Poor insight into current condition Moving all 4 limbs antigravity and against resistance   Assessment/Plan: 1. Functional deficits which require 3+ hours per day of interdisciplinary therapy in a comprehensive inpatient rehab setting. Physiatrist is providing close team supervision and 24 hour management of active medical problems listed below. Physiatrist and rehab team continue to assess barriers to discharge/monitor patient progress toward functional and medical  goals  Care Tool:  Bathing    Body parts bathed by patient: Right arm, Left arm, Chest, Abdomen, Front perineal area, Buttocks, Right upper leg, Left upper leg, Face, Left lower leg, Right lower leg         Bathing assist Assist Level: Minimal Assistance - Patient > 75%     Upper Body Dressing/Undressing Upper body dressing   What is the patient wearing?: Pull over shirt    Upper body assist Assist Level: Minimal Assistance - Patient > 75%    Lower Body Dressing/Undressing Lower body dressing      What is the patient wearing?: Pants     Lower body  assist Assist for lower body dressing: Minimal Assistance - Patient > 75%     Toileting Toileting    Toileting assist Assist for toileting: Supervision/Verbal cueing     Transfers Chair/bed transfer  Transfers assist     Chair/bed transfer assist level: Contact Guard/Touching assist     Locomotion Ambulation   Ambulation assist      Assist level: Contact Guard/Touching assist Assistive device: No Device Max distance: 200   Walk 10 feet activity   Assist     Assist level: Contact Guard/Touching assist Assistive device: No Device   Walk 50 feet activity   Assist    Assist level: Contact Guard/Touching assist Assistive device: No Device    Walk 150 feet activity   Assist    Assist level: Contact Guard/Touching assist Assistive device: No Device    Walk 10 feet on uneven surface  activity   Assist     Assist level: Minimal Assistance - Patient > 75%     Wheelchair     Assist Is the patient using a wheelchair?: No             Wheelchair 50 feet with 2 turns activity    Assist            Wheelchair 150 feet activity     Assist          Blood pressure 103/66, pulse 70, temperature (!) 97.3 F (36.3 C), resp. rate 16, height 5\' 10"  (1.778 m), weight 92.1 kg, SpO2 98 %.   Medical Problem List and Plan:  1. 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 [place] but improving orientation. Does well with non-dynamic movements; lost balance in the shower this week. Can't tolerate frustration or attention for complex tasks. Perseverates on wanting to leave. Per SLP can redirect somewhat, working on external aides. -  7/9: Rough night per nursing, because GF was not allowed to sleep in enclosure bed with him. CGA and close SPV for showering. Limited by pain/weakness in RLE - getting worse per PT.   2.  Antithrombotics: -DVT/anticoagulation:  Pharmaceutical: Lovenox 40mg  BID             -antiplatelet therapy: Aspirin 81 mg daily  3. Pain Management: Oxycodone as needed,   - Robaxin 1000 mg 3 times daily as needed added for right calf pain, spasm -01/06/23 reported HA to nursing, CT head neg yesterday, will  start topamax 25mg  every day PRN for now -01/07/23 no complaints today, cont regimen -7-1: Scheduled Topamax due to recurrent headaches, improved - 7/2: Start gabapentin 300 mg 3 times daily for chronic alcohol use, psychomotor agitation, and recurrent headaches; if does well, may consider changing Topamax to as needed -7/3: Increase gabapentin to 600 mg 3 times daily.  Has increased use of Robaxin and as needed oxycodone today. -7-4: Scheduled Tylenol 1000 mg 3 times daily, as well as Robaxin 1000 mg 3 times daily for ongoing right calf pain.  On chart review, appears to be chronic at least since 2022, MRI back at that time did not show significant nerve impingement or spinal stenosis.  Could consider duplex of right calf, however with ongoing agitation do not feel patient would tolerate and low index of suspicion for DVT, on prophylaxis, continue monitoring. -7/5: X-ray right knee shows no apparent fracture or arthritis; read pending.  Suspect more right hemisensory loss and paresthesias secondary to TBI.  Per patient request, we will schedule oxycodone 5 mg 3 times daily.  Increase gabapentin to 800 mg 3 times daily, and add Elavil 25 mg nightly for neuropathy.  Change Robaxin to as needed.  If pain improves over the weekend, would switch oxycodone back to as needed.  If further pain control needed, could uptitrate Elavil versus add Lyrica. -01/13/23 pain seems controlled, but somnolent this morning; will monitor  for now, may be able to go back to PRN oxycodone dosing tomorrow -01/14/23 R knee/hamstring pain and tightness, will order Kpad for now given multiple recent med adjustments-- leave scheduled oxy for now. Advised trying robaxin. Might need to consider stretches to see if hamstring tightness is part of it? If he will cooperate.  - 7/8: reviewed CT  from admission and MRI 2022 for similar complaints; no apparent traumatic vertebral pathology, R iliac sclerotic lesion unchanged from 2022. Remains uncertain etiology, also with cognitive deficits and daily changes in location/quality of pain difficult to assess. 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 Increase Elavil to 50 mg at bedtime and schedule baclofen  7/9: Improved reports of pain with scheduled baclofen, areas of greatest pain over right paraspinals, PSIS, and SI joints.  Lidocaine patches daily to right back, and increase baclofen to 15 mg 3 times daily - 7/10: Family endorses R knee getting caught with fall; ?MCL / medial meniscus pathology based on exam with overlying neuropathic pain - discussed w/ PT bracing/wrapping knee for support. If no improvement may consider MRI knee as OP.   4. Mood/Behavior/Sleep: Klonopin 0.25 mg twice daily, Ritalin 10 mg twice daily, Inderal 20 mg 3 times daily             -antipsychotic agents: Seroquel 100 mg every morning and 200 mg nightly              - Sleep log 6/28              - veil bed for impulsivity, DC IV on admission d/t limiting lines/drains -01/06/23 agitated at times, wants to leave, but remains in bed and didn't become aggressive; keep enclosure bed in place for now; has ativan 1mg  q6h PRN for agitation -01/07/23 less agitated but found in hallway unattended, advised nursing staff that he needs to be monitored closer, don't think he's ready to be released from vail bed yet.  -7-1: Continue veil bed.   -7-2: Add TeleSitter when available to  allow patient with bed unzipped, in chair as able to prevent agitation.  See note 7-2 for details of discussion with family, will discontinue Ritalin today due to history of causing violent agitation and transition from Seroquel 100 mg every morning/200 mg every afternoon to 100 mg twice daily, add trazodone 100 mg nightly to promote sleep.  Will wean antipsychotics as able, as I do not feel they are helping at this time and patient's agitation is likely considerably attributable to chronic alcohol use. continue sleep log. -7-3: Sleep unchanged with transition from Seroquel 200 mg to 100 mg plus trazodone 100 mg nightly.  Increasing gabapentin as above, may consider resuming 200 mg Seroquel nightly if no improvement.  Continuing to use intermittent Ativan for agitation. -7/4: Per nursing report, slept throughout the night last night.  Much improved!  Continue current management and wean antipsychotics as able, then benzodiazepines. -7-5: Sleep and behaviors much improved, some mildly improving Insight but still remains fairly impaired.  Somewhat lethargic and dysarthric today, concern for oversedation with increasing pain regimen as above.  Switch Seroquel to nightly only, trazodone to as needed, and add Elavil nightly as above.  Monitor closely with sleep log -01/13/23 slept poorly per pt, but with recent changes, will monitor overnight tonight.  -01/14/23 sleep improving - 7/10: Change trazadone 100 mg at bedtime to scheduled, seroquel to PRN   5. Neuropsych/cognition: This patient is not capable of making decisions on his own behalf.              - Rec neuropsych consult  - Patient easily redirectable for now, if attempts AMA he does not have capacity for decision-making.  Has Ativan as needed for anxiety, agitation (see above) -7-1: Reduce standing Clonazepam to 0.125 mg BID; add standing Topomax 25 mg BID for mood and headaches; reduce Ritalin to 5 mg BID - 7/2: Getting PRN ativan nightly @ 2100; sleep  log getting 7 hours awake at 2 am.  See above; discontinue Ritalin, down titrate Seroquel, add trazodone nightly, add gabapentin 300 mg 3 times daily, and increase Klonopin to 0.25 mg 3 times daily to target agitation due to discontinuation of chronic alcohol use; continue as needed Ativan and propranolol. -7-3: Increase in gabapentin as above; may transition from Topamax to Depakote in a.m. if no improvement in behaviors with increase gabapentin -7-4: Does appear improved with increase gabapentin, monitor for now -7-5: Gradually improving, does appear somewhat oversedated, likely from polypharmacy for pain control and agitation.  As above, requiring increased pain medications for right-sided pain; change Seroquel from twice daily to nightly, change trazodone from nightly to as needed, change Robaxin to as needed, change topiramate to as needed.  If no further severe agitation, can start to wean off nightly Seroquel and come down on scheduled clonazepam this weekend. -01/13/23 will decrease clonazepam to 0.25mg  BID from the TID dosing.  -01/14/23 less somnolent this morning, much calmer than before, monitor - 7/8: Improving appropriateness, insight; remains fairly impaired; wean sedating medicaitons as tolerated -7/9: Decrease Seroquel to 50 mg nightly -> 7/10 PRN.     6. Skin/Wound Care: Routine skin checks 7. Fluids/Electrolytes/Nutrition/Dysphagia: Routine in and outs with follow-up chemistries--ordered weekly labs starting Monday 01/08/23              - Cortrak Dced 6/26  - eating 20-25% meals, encourage POs, continue supplements  - eating 0-25% meals; intermittently unsafe to eat d/t aggitation - reduce ritalin as above; patient states that he just does not like  the food here.  Also states that he was drinking "a lot" of alcohol throughout the day prior to hospital admission, did not eat much at baseline.  He is not redirectable to eating more. - Ate 60% breakfast yesterday!  May benefit from  transition from Seroquel to olanzapine, with other medication adjustments as above will hold off for now  - 7/4: Eating improved to >50% approx 1-2 meals per day  - 75-500% meal intakes    8.  Acute hypoxic respiratory failure.  Patient extubated 6/10. 9.  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  Maintained on low-dose aspirin  10.  Alcohol intoxication/history of alcohol abuse/tobacco dependence-Per family, was drinking 1 case per day of beer prior to admission.  Provide counseling.  Monitor for any signs of withdrawal.  - 7/1: Wean to Klonopin 0.125 mg BID -7-2: Significantly more agitation today, along with family discussion revealing significant alcohol dependence; Klonopin to 0.25 mg 3 times daily.  Also add nicotine patch to help prevent cigarette craving.  -7-3: Unchanged, monitor. - 7/8: No ativan since 7/6, reduce clonazepam to 0.125 mg BID - 7/9: Wean scheduled clonazepam over next 2 days. - 7/10: DC clonazepam; has PRN ativan  11.  MRSA pneumonia.  Resolved.  Contact precautions-d/c'd.  Antibiotic therapy completed.  12. Constipation: currently not on any bowel meds -01/07/23 no BM since 01/05/23, if no BM by tomorrow, may need to adjust regimen. Will add some PRNs (miralax, colace, sorbitol) - LBM  7/7   - schedule colace BID and miralax in AM     LOS: 13 days A FACE TO FACE EVALUATION WAS PERFORMED  Shawn Mills 01/17/2023, 9:10 AM

## 2023-01-17 NOTE — Progress Notes (Signed)
Physical Therapy TBI Note  Patient Details  Name: Shawn Mills MRN: [id number] Date of Birth: 17-Jan-1981  Today's Date: 01/17/2023 PT Individual Time: [phone] and [id number] PT Individual Time Calculation (min): 57 min and 27 min  Short Term Goals: Week 2:  PT Short Term Goal 1 (Week 2): STGs=LTGs secondary to ELOS  Skilled Therapeutic Interventions/Progress Updates:     1st Session: Pt received seated in recliner with family present for family education session. Pt reports pain in Rt buttocks with radiating type pain down RLE. During session pt also reporting pain in medial Rt knee and pt's girlfriend reports that pt's knee landed in awkward position when he fell of porch. PT provides stretching, exercises, and compression to manage pain. PT provides education on pt's progress with therapy as well as recommendations for safe mobility following discharge. Pt ambulates to gym with cues for navigation. Pt performs x8 6" steps with bilateral handrails with close supervision and cues for step sequencing. Pt then completes stretching on mat in supine, holding alternating knees into chest for low back and glute stretch, then bilateral knees into chest. Pt performs alternating heel taps on mat for core engagement x10. Pt then performs prone "superman" exercise for engagement of lumbar extensors. Finally pt completes quadruped alternating arm lifts with PT providing verbal and tactile cues for body mechanics and correct performance. Pt ambulates back to room with family. Left seated with all needs within reach.   2nd Session: Pt received seated in recliner with alarm belt on. Agrees to therapy and reports pain in RLE. Number not provided. Pt provides stretching, gentle mobility, and rest breaks to manage pain. Pt performs all mobility with close supervision for safety, but no physical assistance required. Pt ambulates x150' to main gym. Pt performs soft tissue stretch for low back and latissimus dorsi. Pt  then positioned in single leg stance with LLE on ground and RLE propped on high low mat for stretch of gluteal muscles and balance challenge. PT provides minA for balance. Pt performs similar stretch on stairwell for increased ease of balance with bilateral upper extremity support on handrails. Pt then ambulates to main gym and performs ball toss activity with 4lb medicine ball for challenge of coordination, balance, attention, and postural responses. Pt performs overhead passes, chest passes, and tossing ball from both Lt and Rt sides. Pt tosses ball x~100 prior to rest break. Pt then ambulates back to room. Pt left seated in recliner with all needs within reach and alarm intact.   Therapy Documentation Precautions:  Precautions Precautions: Fall Precaution Comments: enclosure bed Restrictions Weight Bearing Restrictions: No  Agitated Behavior Scale: TBI Observation Details Observation Environment: CIR Start of observation period - Date: 01/17/23 Start of observation period - Time: 0900 End of observation period - Date: 01/17/23 End of observation period - Time: 1000 Agitated Behavior Scale (DO NOT LEAVE BLANKS) Short attention span, easy distractibility, inability to concentrate: Present to a slight degree Impulsive, impatient, low tolerance for pain or frustration: Present to a slight degree Uncooperative, resistant to care, demanding: Absent Violent and/or threatening violence toward people or property: Absent Explosive and/or unpredictable anger: Absent Rocking, rubbing, moaning, or other self-stimulating behavior: Absent Pulling at tubes, restraints, etc.: Absent Wandering from treatment areas: Absent Restlessness, pacing, excessive movement: Absent Repetitive behaviors, motor, and/or verbal: Present to a slight degree Rapid, loud, or excessive talking: Absent Sudden changes of mood: Absent Easily initiated or excessive crying and/or laughter: Absent Self-abusiveness, physical  and/or verbal: Absent Agitated behavior scale total  score: 17  Therapy/Group: Individual Therapy  Beau Fanny, PT, DPT 01/17/2023, 4:45 PM

## 2023-01-17 NOTE — Progress Notes (Signed)
Occupational Therapy TBI Note  Patient Details  Name: Shawn Mills MRN: [id number] Date of Birth: [birth year]  Session 1 Today's Date: 01/17/2023 OT Individual Time: 1005-1050 OT Individual Time Calculation (min): 45 min    Session 2  Today's Date: 01/17/2023 OT Individual Time: 4782-9562 OT Individual Time Calculation (min): 41 min    Short Term Goals: Week 2:  OT Short Term Goal 1 (Week 2): Pt will complete full ADL routine with no more than min cueing for safety OT Short Term Goal 2 (Week 2): Pt will reduce agitation as evidenced by 3 session with ABS <28 OT Short Term Goal 3 (Week 2): Pt will demo improved orientation via external cues, identifying place and time with no more than min cueing  Skilled Therapeutic Interventions/Progress Updates:    Session 1 Family education session completed with pt and his SO [place] and mother Gillis Ends. Verbal education provided re TBI education fall risk reduction, energy conservation strategies, home carryover of transfer training, ADLs, and IADLs. Demonstration completed for pt performance of UB/LB bathing and dressing at (S) level, toileting hygiene and transfers, and shower transfers. Provided several TBI handouts re alcohol use, cognitive deficits, emotional regulation, sexuality, and memory deficits. Pt completed a tub transfers, getting down as if he was taking a bath with (S), cueing required for safety. All questions answered to family's satisfaction. Encouraged establishing daily routine and provided examples on safe activities to engage in. Pt left sitting up in the recliner- family signed off for in room transfers.    Session 2 Pt received sitting in the recliner with un-rated pain in his R knee and back, but declining shower, has received all pain medication, and opted for increased mobility/out of room participation as pain management. Pt participated in Wii Bowling game to address pragmatic interactions (appropriate conversation, turn  taking, appropriate level of competitiveness), dynamic balance, and sequencing through multi step directions. Min cueing to begin but pt able to complete 10 rounds sit <> stand with (S) overall and no further cueing required. He then completed cognitive retraining task- completing large 10 piece vertical puzzle. He required min-mod cueing overall with overt trial/error strategy. During this activity, OT placed moist heat pack on his back for pain relief which he really enjoyed. No skin reaction before/after tx. He returned to his room following. Pt was left sitting up in the recliner with all needs met, chair alarm set, and call bell within reach.     Therapy Documentation Precautions:  Precautions Precautions: Fall Precaution Comments: enclosure bed Restrictions Weight Bearing Restrictions: No   Session 1 & 2 Agitated Behavior Scale: TBI Observation Details Observation Environment: CIR Start of observation period - Date: 01/17/23 Start of observation period - Time: 1000 End of observation period - Date: 01/17/23 End of observation period - Time: 1045 Agitated Behavior Scale (DO NOT LEAVE BLANKS) Short attention span, easy distractibility, inability to concentrate: Present to a slight degree Impulsive, impatient, low tolerance for pain or frustration: Present to a slight degree Uncooperative, resistant to care, demanding: Present to a slight degree Violent and/or threatening violence toward people or property: Absent Explosive and/or unpredictable anger: Absent Rocking, rubbing, moaning, or other self-stimulating behavior: Absent Pulling at tubes, restraints, etc.: Absent Wandering from treatment areas: Absent Restlessness, pacing, excessive movement: Absent Repetitive behaviors, motor, and/or verbal: Absent Rapid, loud, or excessive talking: Absent Sudden changes of mood: Absent Easily initiated or excessive crying and/or laughter: Absent Self-abusiveness, physical and/or verbal:  Absent Agitated behavior scale total score: 17  Therapy/Group: Individual Therapy  Crissie Reese 01/17/2023, 8:47 AM

## 2023-01-17 NOTE — Progress Notes (Signed)
Speech Language Pathology TBI Note  Patient Details  Name: Izear Pine MRN: [id number] Date of Birth: 1980/08/23  Today's Date: 01/17/2023 SLP Individual Time: 1130-1200 SLP Individual Time Calculation (min): 30 min  Short Term Goals: Week 2: SLP Short Term Goal 1 (Week 2): Pt will utilize external aids to answer orientation questions Min verbal cues. SLP Short Term Goal 2 (Week 2): Patient will demonstrate sustained attention to functional tasks for 15 minutes with Mod verbal cues for redirection. SLP Short Term Goal 3 (Week 2): Patient will demonstrate functional problem solving for basic and familiar tasks with Min verbal cues. SLP Short Term Goal 4 (Week 2): Patient will utilize word-finding strategies during structured language tasks with Mod verbal cues.  Skilled Therapeutic Interventions:   SLP provided extensive pt/family education re TBI and return to prev roles/responsibilities upon d/c.   Pain  No pain reported.   Agitated Behavior Scale: TBI Observation Details Observation Environment: CIR Start of observation period - Date: 01/17/23 Start of observation period - Time: 0900 End of observation period - Date: 01/17/23 End of observation period - Time: 1000 Agitated Behavior Scale (DO NOT LEAVE BLANKS) Short attention span, easy distractibility, inability to concentrate: Present to a slight degree Impulsive, impatient, low tolerance for pain or frustration: Present to a slight degree Uncooperative, resistant to care, demanding: Absent Violent and/or threatening violence toward people or property: Absent Explosive and/or unpredictable anger: Absent Rocking, rubbing, moaning, or other self-stimulating behavior: Absent Pulling at tubes, restraints, etc.: Absent Wandering from treatment areas: Absent Restlessness, pacing, excessive movement: Absent Repetitive behaviors, motor, and/or verbal: Present to a slight degree Rapid, loud, or excessive talking: Absent Sudden  changes of mood: Absent Easily initiated or excessive crying and/or laughter: Absent Self-abusiveness, physical and/or verbal: Absent Agitated behavior scale total score: 17  Therapy/Group: Individual Therapy  Pati Gallo 01/17/2023, 5:37 PM

## 2023-01-17 NOTE — Progress Notes (Signed)
   01/17/23 1104  Behavioral Plan Guideline  Rancho Level VI-Confused, Appropriate  Behavior to decrease/eliminate Decrease verbal agitation with staff;Increase participation;Decrease impulsivity;Decrease elopement risk;Decrease restlessness;Decrease impulsivity with toileting  Changes to environment Lights on, blinds open during the day, off and closed at night;Place signs in room to remind pt to call for assistance  Minimize in room stimulation  Limit visitors to no more than 2 at a time  Interventions Telesitter;Bed alarm setting;# of rails up;Timed toileting (alarm waist belt)  Bed alarm setting Medium sensitivity  # of rails up 3 (1 upper side rail and 2 lower side rails)  Recommendations for interactions with patient Remain calm and reduce environment stimulation with agitation;Redirect behaviors to alternate tasks/topics;DO NOT argue with patient;Offer toileting with every interaction  In Attendance at Behavior Plan Meeting  OT;PT;SLP;RN

## 2023-01-18 DIAGNOSIS — M25561 Pain in right knee: Secondary | ICD-10-CM

## 2023-01-18 DIAGNOSIS — G479 Sleep disorder, unspecified: Secondary | ICD-10-CM

## 2023-01-18 NOTE — Progress Notes (Signed)
PROGRESS NOTE   Subjective/Complaints:  Patient complained to me of low back, began about how he was needing surgery for it. Says he struggles to find a comfortable spot. Didn't mention right leg pain. Had a good night without enclosure bed. SO stayed over.  ROS: Limited due to cognitive/behavioral     Objective:   No results found. No results for input(s): "WBC", "HGB", "HCT", "PLT" in the last 72 hours. No results for input(s): "NA", "K", "CL", "CO2", "GLUCOSE", "BUN", "CREATININE", "CALCIUM" in the last 72 hours.  Intake/Output Summary (Last 24 hours) at 01/18/2023 1013 Last data filed at 01/18/2023 0731 Gross per 24 hour  Intake 1080 ml  Output --  Net 1080 ml        Physical Exam: Vital Signs Blood pressure (!) 126/91, pulse 85, temperature 97.8 F (36.6 C), resp. rate 18, height 5\' 10"  (1.778 m), weight 92.1 kg, SpO2 100%.  Constitutional: No distress . Vital signs reviewed. HEENT: NCAT, EOMI, oral membranes moist Neck: supple Cardiovascular: RRR without murmur. No JVD    Respiratory/Chest: CTA Bilaterally without wheezes or rales. Normal effort    GI/Abdomen: BS +, non-tender, non-distended Ext: no clubbing, cyanosis, or edema Psych: distracted but pleasant, cooperated with me MsK: moves all extremities antigravity, able to sit in bed unassisted.   TTP throughout  right posterior paraspinals, PSIS, and SI joint; worst on R medial and posterior knee joint  No palpable deformity. + medial knee pain Skin : + facial rash - unchanged Neuro: Oriented, distracted, very tangential, and often perseverative on the tangent, confabulates.  Moves all 4's. Sensory deficit R lateral thigh and calf, into dorsal foot-hyperesthesia     Assessment/Plan: 1. Functional deficits which require 3+ hours per day of interdisciplinary therapy in a comprehensive inpatient rehab setting. Physiatrist is providing close team  supervision and 24 hour management of active medical problems listed below. Physiatrist and rehab team continue to assess barriers to discharge/monitor patient progress toward functional and medical goals  Care Tool:  Bathing    Body parts bathed by patient: Right arm, Left arm, Chest, Abdomen, Front perineal area, Buttocks, Right upper leg, Left upper leg, Face, Left lower leg, Right lower leg         Bathing assist Assist Level: Minimal Assistance - Patient > 75%     Upper Body Dressing/Undressing Upper body dressing   What is the patient wearing?: Pull over shirt    Upper body assist Assist Level: Minimal Assistance - Patient > 75%    Lower Body Dressing/Undressing Lower body dressing      What is the patient wearing?: Pants     Lower body assist Assist for lower body dressing: Minimal Assistance - Patient > 75%     Toileting Toileting    Toileting assist Assist for toileting: Supervision/Verbal cueing     Transfers Chair/bed transfer  Transfers assist     Chair/bed transfer assist level: Contact Guard/Touching assist     Locomotion Ambulation   Ambulation assist      Assist level: Contact Guard/Touching assist Assistive device: No Device Max distance: 200   Walk 10 feet activity   Assist     Assist level:  Contact Guard/Touching assist Assistive device: No Device   Walk 50 feet activity   Assist    Assist level: Contact Guard/Touching assist Assistive device: No Device    Walk 150 feet activity   Assist    Assist level: Contact Guard/Touching assist Assistive device: No Device    Walk 10 feet on uneven surface  activity   Assist     Assist level: Minimal Assistance - Patient > 75%     Wheelchair     Assist Is the patient using a wheelchair?: No             Wheelchair 50 feet with 2 turns activity    Assist            Wheelchair 150 feet activity     Assist          Blood pressure (!)  126/91, pulse 85, temperature 97.8 F (36.6 C), resp. rate 18, height 5\' 10"  (1.778 m), weight 92.1 kg, SpO2 100%.   Medical Problem List and Plan:  1. 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 Rancho V-VI but improving orientation. Does well with non-dynamic movements; lost balance in the shower this week. Can't tolerate frustration or attention for complex tasks. Perseverates on wanting to leave. Per SLP can redirect somewhat, working on external aides. - 7/11 had a better night out of enclosure bed. SO with him seemed to help also  2.  Antithrombotics: -DVT/anticoagulation:  Pharmaceutical: Lovenox 40mg  BID             -antiplatelet therapy: Aspirin 81 mg daily  3. Pain Management: Oxycodone as needed,   - Robaxin 1000 mg 3 times daily as needed added for right calf pain, spasm -01/06/23 reported HA to nursing, CT head neg yesterday, will start topamax 25mg  every day PRN for now -01/07/23 no complaints today, cont regimen -7-1: Scheduled Topamax due to recurrent headaches, improved - 7/2: Start gabapentin 300 mg 3 times daily for chronic alcohol use, psychomotor agitation, and recurrent headaches; if does well, may consider changing Topamax to as needed -7/3: Increase gabapentin to 600 mg 3 times daily.  Has increased use of Robaxin and as needed oxycodone today. -7-4: Scheduled Tylenol 1000 mg 3 times daily, as well as Robaxin 1000 mg 3 times daily for ongoing right calf pain.  On chart review, appears to be chronic at least since 2022, MRI back at that time did not show significant nerve impingement or spinal stenosis.  Could consider duplex of right calf, however with ongoing agitation  do not feel patient would tolerate and low index of suspicion for DVT, on prophylaxis, continue monitoring. -7/5: X-ray right knee shows no apparent fracture or arthritis; read pending.  Suspect more right hemisensory loss and paresthesias secondary to TBI.  Per patient request, we will schedule oxycodone 5 mg 3 times daily.  Increase gabapentin to 800 mg 3 times daily, and add Elavil 25 mg nightly for neuropathy.  Change Robaxin to as needed.  If pain improves over the weekend, would switch oxycodone back to as needed.  If further pain control needed,  could uptitrate Elavil versus add Lyrica. -01/13/23 pain seems controlled, but somnolent this morning; will monitor for now, may be able to go back to PRN oxycodone dosing tomorrow -01/14/23 R knee/hamstring pain and tightness, will order Kpad for now given multiple recent med adjustments-- leave scheduled oxy for now. Advised trying robaxin. Might need to consider stretches to see if hamstring tightness is part of it? If he will cooperate.  - 7/8: reviewed CT  from admission and MRI 2022 for similar complaints; no apparent traumatic vertebral pathology, R iliac sclerotic lesion unchanged from 2022. Remains uncertain etiology, also with cognitive deficits and daily changes in location/quality of pain difficult to assess. 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 Increase Elavil to 50 mg at bedtime and schedule baclofen  7/9: Improved reports of pain with scheduled baclofen, areas of greatest pain over right paraspinals, PSIS, and SI joints.  Lidocaine patches daily to right back, and increase baclofen to 15 mg 3 times daily - 7/10: Family endorses R knee getting caught with fall; ?MCL / medial meniscus pathology based on exam-- -7/11 pt with some pain reported in therapy. Did better in second session. Pt was fixated on back this morning and mentioned nothing of knee. Xrays negative. If the knee continues  to cause him major issues, will consider MRI. Will reach out to therapy about the knee and it's impact with their activities. ?brace 4. Mood/Behavior/Sleep: Klonopin 0.25 mg twice daily, Ritalin 10 mg twice daily, Inderal 20 mg 3 times daily             -antipsychotic agents: Seroquel 100 mg every morning and 200 mg nightly              - Sleep log 6/28              - veil bed for impulsivity, DC IV on admission d/t limiting lines/drains -01/06/23 agitated at times, wants to leave, but remains in bed and didn't become aggressive; keep enclosure bed in place for now; has ativan 1mg  q6h PRN for agitation -01/07/23 less agitated but found in hallway unattended, advised nursing staff that he needs to be monitored closer, don't think he's ready to be released from vail bed yet.  -7-1: Continue veil bed.   -7-2: Add TeleSitter when available to allow patient with bed unzipped, in chair as able to prevent agitation.  See note 7-2 for details of discussion with family, will discontinue Ritalin today due to history of causing violent agitation and transition from Seroquel 100 mg every morning/200 mg every afternoon to 100 mg twice daily, add trazodone 100 mg nightly to promote sleep.  Will wean antipsychotics as able, as I do not feel they are helping at this time and patient's agitation is likely considerably attributable to chronic alcohol use. continue sleep log. -7-3: Sleep unchanged with transition from Seroquel 200 mg to 100 mg plus trazodone 100 mg nightly.  Increasing gabapentin as above, may consider resuming 200 mg Seroquel nightly if no improvement.  Continuing to use intermittent Ativan for agitation. -7/4: Per nursing report, slept throughout the night last night.  Much improved!  Continue current management and wean antipsychotics as able, then benzodiazepines. -7-5: Sleep and behaviors much improved, some mildly improving Insight but still remains fairly impaired.  Somewhat lethargic and dysarthric  today, concern for oversedation with increasing pain regimen as above.  Switch Seroquel to nightly only, trazodone to as needed, and add  Elavil nightly as above.  Monitor closely with sleep log -01/13/23 slept poorly per pt, but with recent changes, will monitor overnight tonight.  -01/14/23 sleep improving - 7/10: Change trazadone 100 mg at bedtime to scheduled, seroquel to PRN   -7/11 slept well with the above, out of enclosure 5. Neuropsych/cognition: This patient is not capable of making decisions on his own behalf.              - Rec neuropsych consult  - Patient easily redirectable for now, if attempts AMA he does not have capacity for decision-making.  Has Ativan as needed for anxiety, agitation (see above) -7-1: Reduce standing Clonazepam to 0.125 mg BID; add standing Topomax 25 mg BID for mood and headaches; reduce Ritalin to 5 mg BID - 7/2: Getting PRN ativan nightly @ 2100; sleep log getting 7 hours awake at 2 am.  See above; discontinue Ritalin, down titrate Seroquel, add trazodone nightly, add gabapentin 300 mg 3 times daily, and increase Klonopin to 0.25 mg 3 times daily to target agitation due to discontinuation of chronic alcohol use; continue as needed Ativan and propranolol. -7-3: Increase in gabapentin as above; may transition from Topamax to Depakote in a.m. if no improvement in behaviors with increase gabapentin -7-4: Does appear improved with increase gabapentin, monitor for now -7-5: Gradually improving, does appear somewhat oversedated, likely from polypharmacy for pain control and agitation.  As above, requiring increased pain medications for right-sided pain; change Seroquel from twice daily to nightly, change trazodone from nightly to as needed, change Robaxin to as needed, change topiramate to as needed.  If no further severe agitation, can start to wean off nightly Seroquel and come down on scheduled clonazepam this weekend. -01/13/23 will decrease clonazepam to 0.25mg  BID from  the TID dosing.  -01/14/23 less somnolent this morning, much calmer than before, monitor - 7/8: Improving appropriateness, insight; remains fairly impaired; wean sedating medicaitons as tolerated -Seroquel to 50 mg nightly PRN.     6. Skin/Wound Care: Routine skin checks 7. Fluids/Electrolytes/Nutrition/Dysphagia: Routine in and outs with follow-up chemistries--ordered weekly labs starting Monday 01/08/23              - Cortrak Dced 6/26  - eating 20-25% meals, encourage POs, continue supplements  - eating 0-25% meals; intermittently unsafe to eat d/t aggitation - reduce ritalin as above; patient states that he just does not like the food here.  Also states that he was drinking "a lot" of alcohol throughout the day prior to hospital admission, did not eat much at baseline.  He is not redirectable to eating more. - Ate 60% breakfast yesterday!  May benefit from transition from Seroquel to olanzapine, with other medication adjustments as above will hold off for now  - 7/4: Eating improved to >50% approx 1-2 meals per day  -    8.  Acute hypoxic respiratory failure.  Patient extubated 6/10. 9.  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  Maintained on low-dose aspirin  10.  Alcohol intoxication/history of alcohol abuse/tobacco dependence-Per family, was drinking 1 case per day of beer prior to admission.  Provide counseling.  Monitor for any signs of withdrawal.  - 7/1: Wean to Klonopin 0.125 mg BID -7-2: Significantly more agitation today, along with family discussion revealing significant alcohol dependence; Klonopin to 0.25 mg 3 times daily.  Also add nicotine patch to help prevent cigarette craving.  -7-3: Unchanged, monitor. - 7/8: No ativan since 7/6, reduce clonazepam to 0.125 mg BID - 7/9: Wean scheduled clonazepam over  next 2 days. - 7/10: DC clonazepam; has PRN ativan  11.  MRSA pneumonia.  Resolved.  Contact precautions-d/c'd.  Antibiotic therapy completed.  12. Constipation: currently not on any bowel  meds -01/07/23 no BM since 01/05/23, if no BM by tomorrow, may need to adjust regimen. Will add some PRNs (miralax, colace, sorbitol) - LBM  7/10   - schedule colace BID and miralax in AM     LOS: 14 days A FACE TO FACE EVALUATION WAS PERFORMED  Ranelle Oyster 01/18/2023, 10:13 AM

## 2023-01-18 NOTE — Progress Notes (Signed)
Physical Therapy TBI Note  Patient Details  Name: Shawn Mills MRN: [id number] Date of Birth: Nov 21, 1980  Today's Date: 01/18/2023 PT Individual Time: 4098-1191 and 4782-9562 PT Individual Time Calculation (min): 59 min and 57 min  Short Term Goals: Week 2:  PT Short Term Goal 1 (Week 2): STGs=LTGs secondary to ELOS  Skilled Therapeutic Interventions/Progress Updates:     1st Session: Pt received seated in recliner and agrees to therapy. Reports pain in low back and RLE. Number not provided. PT provides rest breaks as needed to manage pain. Pt performs all mobility with supervision for safety and to maintain on task due to pt becoming easily distracted by external stimuli. Pt ambulates to dayroom and performs peg pattern recreation at high low table while standing on airex pad to provide unreliable somatosensory input and to challenge balance with multitasking. Pt able to complete task without error but requires occasional cueing to remain on task. Pt then dons gloves with and sanitizes pieces prior to placing back in case. Pt ambulates to main gym and performs various activities to help manage low back pain and strengthen core and BUE. Pt completes x10 shoulder presses with 10lb weights and cues for body mechanics. Pt reports pain in Rt shoulder so PT suggests decreasing resistance and altering activity, but pt is insistent on continuing to perform exercises, despite pain. Pt completes 2x10 biceps curls, standing rows, external rotation (with 6lb weights in sidelying), and standing scapular protraction with hands on wall.  Pt ambulates back to room and left seated in [facility] with alarm intact and all needs within reach.   2nd Session: Pt received seated in recliner and agrees to therapy. Reports pain in back and RLE. PT provides rest breaks and mobility to manage pain. Session initially focuses on bathing, with pt performing shower with close supervision and cues for use of shower chair for safety,  despite pt's insistence that he can shower safely in standing.   Pt ambulates to dayroom with cues for navigation and safety as pt insists on sitting on rolling stool. Pt then completes Nustep with only BLEs to work on endurance training and attention to task. Pt reports that RUE is painful and so upper extremities no utilized. Pt completes x10:00 at workload of 5 with average steps per minute ~65.  Pt ambulates to main gym and participates in Checkers game standing at high low table to challenge activity tolerance, attention to task, and cognition. Pt completes activity without requiring rest break and does not require cueing for correct performance.   Pt completes quadruped activity for core strengthening, coordination, and WB through BUEs and BLEs. Pt tasked with grabbing cones on Rt side with alternating upper extremities and placing cones on Lt side, then performing the same in opposite direction. PT provides cueing for body mechanics and correct motor patterns. Pt ambulates back to room. Left seated in recliner with alarm intact and all needs within reahc.   Therapy Documentation Precautions:  Precautions Precautions: Fall Precaution Comments: enclosure bed Restrictions Weight Bearing Restrictions: No  Agitated Behavior Scale: TBI Observation Details Observation Environment: CIR Start of observation period - Date: 01/18/23 Start of observation period - Time: 0730 End of observation period - Date: 01/18/23 End of observation period - Time: 0815 Agitated Behavior Scale (DO NOT LEAVE BLANKS) Short attention span, easy distractibility, inability to concentrate: Present to a slight degree Impulsive, impatient, low tolerance for pain or frustration: Present to a slight degree Uncooperative, resistant to care, demanding: Present to a slight  degree Violent and/or threatening violence toward people or property: Absent Explosive and/or unpredictable anger: Absent Rocking, rubbing, moaning,  or other self-stimulating behavior: Absent Pulling at tubes, restraints, etc.: Absent Wandering from treatment areas: Present to a slight degree Restlessness, pacing, excessive movement: Absent Repetitive behaviors, motor, and/or verbal: Present to a slight degree Rapid, loud, or excessive talking: Present to a slight degree Sudden changes of mood: Absent Easily initiated or excessive crying and/or laughter: Absent Self-abusiveness, physical and/or verbal: Absent Agitated behavior scale total score: 20    Therapy/Group: Individual Therapy  Beau Fanny, PT, DPT 01/18/2023, 4:46 PM

## 2023-01-18 NOTE — Progress Notes (Signed)
Speech Language Pathology TBI Note  Patient Details  Name: Shawn Mills MRN: [id number] Date of Birth: 01/01/1981  Today's Date: 01/18/2023 SLP Individual Time: 4098-1191 SLP Individual Time Calculation (min): 45 min  Short Term Goals: Week 2: SLP Short Term Goal 1 (Week 2): Pt will utilize external aids to answer orientation questions Min verbal cues. SLP Short Term Goal 2 (Week 2): Patient will demonstrate sustained attention to functional tasks for 15 minutes with Mod verbal cues for redirection. SLP Short Term Goal 3 (Week 2): Patient will demonstrate functional problem solving for basic and familiar tasks with Min verbal cues. SLP Short Term Goal 4 (Week 2): Patient will utilize word-finding strategies during structured language tasks with Mod verbal cues.  Skilled Therapeutic Interventions: Skilled treatment session focused on cognitive goals. Upon arrival, patient was awake while upright in the recliner and reported lower back pain. Nursing made aware. SLP administered the COGNISTAT with patient scoring WFL on all subtests with the exception of moderate impairments in short-term recall. Patient eager to ambulated around the unit in hopes of reducing back pain. SLP provided patient with 3 items to locate around the unit. Patient opted to not write the information down but was able to recall the items with overall supervision level verbal cues. Min verbal cues were needed for navigation and problem solving when attempting to locate items. Patient with decreased selective attention to task in a mildly distracting environment with one small LOB noted. Patient requested to use the bathroom and was continent of urine. Overall, patient appeared more verbose compared to previous sessions with phonemic paraphasias noted. Patient left upright in recliner with alarm on and all needs within reach. Continue with current plan of care.         Pain Pain Assessment Pain Scale: 0-10 Pain Score: 8  Pain  Location: back Pain Orientation: lower Pain Intervention(s): Nursing made aware and patient repositioned   Agitated Behavior Scale: TBI Observation Details Observation Environment: CIR Start of observation period - Date: 01/18/23 Start of observation period - Time: 0730 End of observation period - Date: 01/18/23 End of observation period - Time: 0815 Agitated Behavior Scale (DO NOT LEAVE BLANKS) Short attention span, easy distractibility, inability to concentrate: Present to a slight degree Impulsive, impatient, low tolerance for pain or frustration: Present to a slight degree Uncooperative, resistant to care, demanding: Present to a slight degree Violent and/or threatening violence toward people or property: Absent Explosive and/or unpredictable anger: Absent Rocking, rubbing, moaning, or other self-stimulating behavior: Absent Pulling at tubes, restraints, etc.: Absent Wandering from treatment areas: Present to a slight degree Restlessness, pacing, excessive movement: Absent Repetitive behaviors, motor, and/or verbal: Present to a slight degree Rapid, loud, or excessive talking: Present to a slight degree Sudden changes of mood: Absent Easily initiated or excessive crying and/or laughter: Absent Self-abusiveness, physical and/or verbal: Absent Agitated behavior scale total score: 20  Therapy/Group: Individual Therapy  Valerie Cones 01/18/2023, 8:42 AM

## 2023-01-18 NOTE — Progress Notes (Signed)
Patient A/O x4, slept well through his first night without an enclosure bed. Patient significant other stayed over night. Once significant other left a 0400am patient needed redirecting back to bed and reminder to call staff for assistance when getting out of bed to ambulate in room. Patient refused grip socks or shoes when getting out of bed. Patient currently in bed with bed alarm on and call bell within reach.

## 2023-01-18 NOTE — Progress Notes (Signed)
Occupational Therapy TBI Note  Patient Details  Name: Polk Minor MRN: [id number] Date of Birth: 01/12/1981  Today's Date: 01/18/2023 OT Individual Time: 7829-5621 OT Individual Time Calculation (min): 30 min    Short Term Goals: Week 1:  OT Short Term Goal 1 (Week 1): Patient will recall that he is in the hospital with min quetsioning cues. OT Short Term Goal 1 - Progress (Week 1): Met OT Short Term Goal 2 (Week 1): Patient will demonstrate improved safety awareness by sitting down to don pants with min questioning cues. OT Short Term Goal 2 - Progress (Week 1): Met OT Short Term Goal 3 (Week 1): Patient will maintain standing balance at the sink with CGA during functional BADL tasks. OT Short Term Goal 3 - Progress (Week 1): Met Week 2:  OT Short Term Goal 1 (Week 2): Pt will complete full ADL routine with no more than min cueing for safety OT Short Term Goal 2 (Week 2): Pt will reduce agitation as evidenced by 3 session with ABS <28 OT Short Term Goal 3 (Week 2): Pt will demo improved orientation via external cues, identifying place and time with no more than min cueing Week 3:     Skilled Therapeutic Interventions/Progress Updates:    1:1 Pt received in the recliner with chair alarm.  Engaged in therapeutic activity to continue to address working memory within task. Pt engaged in modified game of "Call It" with task of recalling categories and naming without repeating items in the category without visual cue of what category was. Pt able to perform with 90% accuracy and was able to engage socially appropriate with good recall.  Also able to use his memory aid calendar without cues to figure out when his d/c date was.   Pt left sitting up in recliner in prep for lunch.   Therapy Documentation Precautions:  Precautions Precautions: Fall Precaution Comments: enclosure bed Restrictions Weight Bearing Restrictions: No  Pain:  No indications of pain Agitated Behavior  Scale: TBI Observation Details Observation Environment: room Start of observation period - Date: 01/18/23 Start of observation period - Time: 1115 End of observation period - Date: 01/18/23 End of observation period - Time: 1145 Agitated Behavior Scale (DO NOT LEAVE BLANKS) Short attention span, easy distractibility, inability to concentrate: Present to a slight degree Impulsive, impatient, low tolerance for pain or frustration: Absent Uncooperative, resistant to care, demanding: Absent Violent and/or threatening violence toward people or property: Absent Explosive and/or unpredictable anger: Absent Rocking, rubbing, moaning, or other self-stimulating behavior: Absent Pulling at tubes, restraints, etc.: Absent Wandering from treatment areas: Absent Restlessness, pacing, excessive movement: Absent Repetitive behaviors, motor, and/or verbal: Absent Rapid, loud, or excessive talking: Absent Sudden changes of mood: Absent Easily initiated or excessive crying and/or laughter: Absent Self-abusiveness, physical and/or verbal: Absent Agitated behavior scale total score: 15     Therapy/Group: Individual Therapy  Roney Mans [facility] 01/18/2023, 4:29 PM

## 2023-01-18 NOTE — Discharge Summary (Signed)
Physician Discharge Summary  Patient ID: Shawn Mills MRN: [id number] DOB/AGE: 41/07/1980 41 y.o.  Admit date: 01/04/2023 Discharge date: 01/23/2023  Discharge Diagnoses:  Principal Problem:   TBI (traumatic brain injury) [facility]) DVT prophylaxis Pain management Acute hypoxic respiratory failure Possible ICA dissection Alcohol/tobacco use MRSA pneumonia Constipation  Discharged Condition: Stable  Significant Diagnostic Studies: DG Knee 1-2 Views Right  Result Date: 01/12/2023 CLINICAL DATA:  Right knee pain. EXAM: RIGHT KNEE - 1-2 VIEW COMPARISON:  None Available. FINDINGS: No significant joint space narrowing. No joint effusion. The cortices are intact. Normal bone mineralization. No acute fracture or dislocation. IMPRESSION: Normal right knee radiographs. Electronically Signed   By: Neita Garnet M.D.   On: 01/12/2023 11:36   CT HEAD WO CONTRAST ( )  Result Date: 01/05/2023 CLINICAL DATA:  Headache, sudden, severe Headache, increasing frequency or severity new onset nausea EXAM: CT HEAD WITHOUT CONTRAST TECHNIQUE: Contiguous axial images were obtained from the base of the skull through the vertex without intravenous contrast. RADIATION DOSE REDUCTION: This exam was performed according to the departmental dose-optimization program which includes automated exposure control, adjustment of the mA and/or kV according to patient size and/or use of iterative reconstruction technique. COMPARISON:  CT head 12/08/2022 FINDINGS: Brain: Left frontal lobe encephalomalacia. Subacute 8 mm left posterior subdural hematoma. No evidence of large-territorial acute infarction. No parenchymal hemorrhage. No mass lesion. No extra-axial collection. No mass effect or midline shift. No hydrocephalus. Basilar cisterns are patent. Vascular: No hyperdense vessel. Skull: No acute fracture or focal lesion. Sinuses/Orbits: Complete opacification of the right maxillary. Otherwise paranasal sinuses. Bilateral partial  mastoid air cell effusions. The orbits are unremarkable. Other: None. IMPRESSION: 1. Subacute 8 mm left posterior subdural hematoma. No findings to suggest superimposed acute bleed. 2. No acute intracranial abnormality. 3. Bilateral partial mastoid air cell effusions. Electronically Signed   By: Tish Frederickson M.D.   On: 01/05/2023 18:16   DG Abd Portable 1V  Result Date: 12/29/2022 CLINICAL DATA:  Feeding tube placement EXAM: PORTABLE ABDOMEN - 1 VIEW COMPARISON:  KUB 12/20/2022 FINDINGS: The enteric catheter tip projects over the distal stomach. There is a nonobstructive bowel gas pattern in the imaged upper abdomen. There is no definite free intraperitoneal air. The cardiomediastinal silhouette is normal. There is no focal consolidation or pulmonary edema. There is no pleural effusion or pneumothorax There is no acute osseous abnormality. IMPRESSION: Enteric catheter tip in the distal stomach. Electronically Signed   By: Lesia Hausen M.D.   On: 12/29/2022 11:37    Labs:  Basic Metabolic Panel: Recent Labs  Lab 01/22/23 0641  NA 138  K 3.7  CL 104  CO2 26  GLUCOSE 88  BUN 6  CREATININE 0.75  CALCIUM 8.9    CBC: Recent Labs  Lab 01/22/23 0641  WBC 6.0  HGB 13.3  HCT 39.0  MCV 98.2  PLT 418*    CBG: No results for input(s): "GLUCAP" in the last 168 hours.  Brief HPI:   Shawn Mills is a 42 y.o. right-handed male with history of tobacco alcohol use.  Per chart review lives with significant other independent prior to admission.  Presented 12/07/2022 after falling backwards on his front porch striking the back of his head.  Admission chemistries unremarkable except sodium 132 glucose 115 AST 52 ALT 55 WBC 14,100 alcohol level 270 lactic acid 2.1.  Cranial CT scan showed subdural and subarachnoid hemorrhage along the left frontal lobe and temporal lobe without significant mass effect or midline shift.  Nondisplaced  right parietal fracture which extends inferiorly into the right  lambdoid suture with diastasis of the right lambdoid suture.  Fractures extending through the bilateral sphenoid sinuses and into the bilateral carotid canals.  Fracture also extends superiorly through the clivus into the posterior fossa and through the floor the sphenoid sinuses and into the nasopharynx.  Vertically oriented fracture through the right mastoid which extends into the middle ear adjacent to the ossicles with likely hemorrhage around the ossicles in the middle ear.  CT cervical spine negative.  CT chest abdomen pelvis unremarkable.  Patient did require short-term intubation through 12/18/2022.  CT angiogram head and neck irregularity and linear filling defect in the left distal petrous segment and possibly in the bilateral cavernous segments concerning for dissection.  No hemodynamically significant stenosis in the neck.  Interventional radiology performed a cerebral arteriogram found occluded right sigmoid sinus and right internal jugular vein.  No evidence of cortical venous reflux and no occlusion shunting or aneurysm or dissections extracranially or intracranially.  Patient was cleared for low-dose aspirin.  Follow-up neurosurgery in regards to traumatic SAH conservative care placed on Keppra x 7 days.  ENT did not recommend operative intervention for facial fractures.  EEG negative for seizure.  Patient was placed on CIWA for alcohol intoxication.  Treated for suspected aspiration pneumonia completing course of antibiotic.  His diet was slowly advanced.  Lovenox added for DVT prophylaxis.  Patient with ongoing bouts of agitation and restlessness maintained on Inderal as well as Klonopin with the addition of Seroquel.  Ritalin was added for activation.  An enclosure bed was used for patient's safety.  Therapy evaluations completed due to patient's TBI and decreased functional mobility was admitted for a comprehensive rehab program.   Hospital Course: Shawn Mills was admitted to rehab 01/04/2023  for inpatient therapies to consist of PT, ST and OT at least three hours five days a week. Past admission physiatrist, therapy team and rehab RN have worked together to provide customized collaborative inpatient rehab.  Pertaining to patient's traumatic brain injury and numerous skull fractures remained stable conservative care by neurosurgery as well as ENT.  He had been cleared for Lovenox for DVT prophylaxis.  Maintained on low-dose aspirin for possible ICA dissection with CT angiogram head and neck unremarkable.  Mood stabilization with enclosure bed for safety maintained on Klonopin that he was slowly weaned from as well as low-dose Seroquel.  Patient had been initially placed on Ritalin for focus and attention to task which had greatly improved and Ritalin has since been discontinued.    Pain management with the use of Topamax for headaches as well as oxycodone for breakthrough pain a Lidoderm patch was initiated as well as Neurontin 400 mg 3 times daily with Voltaren gel and baclofen 15 mg 3 times daily and the addition of Lyrica 50 mg 3 times daily.  History of tobacco and alcohol use NicoDerm patch as directed and monitoring for any signs of alcohol withdrawal as well as receiving counseling regards to cessation of alcohol and tobacco.  MRSA pneumonia resolved contact precautions antibiotic therapy is completed patient remained afebrile.  His diet had been advanced to a regular consistency.   Blood pressures were monitored on TID basis and controlled    Rehab course: During patient's stay in rehab weekly team conferences were held to monitor patient's progress, set goals and discuss barriers to discharge. At admission, patient required minimal assist ambulation 35 feet minimal guard sit to stand  Physical exam.  Blood  pressure 120/86 pulse 92 temperature 98.7 respirations 20 oxygen saturation 97% room air Constitutional.  No acute distress HEENT Head.  Normocephalic and atraumatic Eyes.  Pupils  round and reactive to light no discharge without nystagmus Neck.  Supple nontender no JVD without thyromegaly Cardiac regular rate and rhythm without any extra sounds or murmur heard Abdomen.  Soft nontender positive bowel sounds without rebound Respiratory effort normal no respiratory distress without wheeze Musculoskeletal.  Strength throughout 5 out of 5 Neurologic.  Patient alert to person and hospital.  Not oriented to event.  Follows simple commands.  Could not spell world backwards.  Could not complete complex problem-solving.  He/She  has had improvement in activity tolerance, balance, postural control as well as ability to compensate for deficits. He/She has had improvement in functional use RUE/LUE  and RLE/LLE as well as improvement in awareness.  Patient ambulates to the therapy gym with cues for navigation.  Performs 6 inch steps with bilateral handrails and close supervision.  Verbal education provided regarding TBI education fall risk reduction energy conservation with family and caregivers.  Demonstration completed for patient performance of upper lower body bathing and dressing and supervision level toileting hygiene and transfers.  Speech therapy follow-up patient still demonstrating short attention span and easily distracted inability at times to concentrate and would do better in a quiet environment.  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  He did benefit from moderate verbal cues for information processing organization.  Overall his mental status did continue to improve.  Full family teaching completed plan discharge to home       Disposition: Discharge to home    Diet: Regular  Special Instructions: No driving smoking or alcohol  Medications at discharge. 1.  Elavil 25 mg p.o. nightly 2.  Aspirin 81 mg p.o. daily 3.  Baclofen 15 mg p.o. 3 times daily 4.  Voltaren gel 4 g twice daily to  affected area 5.  Folic acid 1 mg p.o. daily 6.  Neurontin 300 mg p.o. 3 times daily 7.  Lidoderm patch as directed 8.  Multivitamin daily 9.  NicoDerm patch taper as directed 10.  MiraLAX daily hold for loose stools 11.  Seroquel 50 mg p.o. nightly as needed sleep or agitation 12.  Topamax 25 mg p.o. twice daily as needed headache 13.  Trazodone 100 mg p.o. nightly 14.  Oxycodone 5 mg p.o. every 6 hours as needed pain 15.  Lyrica 50 mg 3 times daily  30-35 minutes were spent completing discharge summary and discharge planning  Discharge Instructions     Ambulatory referral to Physical Medicine Rehab   Complete by: As directed    Moderate complexity follow-up 1 to 2 weeks TBI        Follow-up Information     Angelina Sheriff, DO Follow up.   Specialty: Physical Medicine and Rehabilitation Why: Office to call for appointment Contact information: [address] [place] [address] [phone]         Coletta Memos, MD Follow up.   Specialty: Neurosurgery Why: Call for appointment Contact information: [address] [place] [address] [phone]         Suzanna Obey, MD Follow up.   Specialty: Otolaryngology Why: Call for appointment as needed Contact information: [address] [place] [address] [phone]  Signed: Mcarthur Rossetti Larisa Lanius 01/23/2023, 4:51 AM

## 2023-01-18 NOTE — Progress Notes (Signed)
Patient ID: Shawn Mills, male   DOB: 1981/02/20, 42 y.o.   MRN: [id number]  SW spoke with pt s/o Melissa to follow-up as SW informed there were questions. States she and his mother had many questions answered by physician. Inquired about letter from physician. SW shared received today. SW emailed. Discussed discharge process. She will confirm with SW if she will pick him up later than normal d/c hours, or if his parents will come.   Cecile Sheerer, MSW, LCSWA Office: [phone] Cell: [phone] Fax: [phone]

## 2023-01-19 MED ORDER — PREGABALIN 25 MG PO CAPS
25.0000 mg | ORAL_CAPSULE | Freq: Three times a day (TID) | ORAL | Status: DC
  Administered 2023-01-19 – 2023-01-20 (×3): 25 mg via ORAL
  Filled 2023-01-19 (×3): qty 1

## 2023-01-19 MED ORDER — AMITRIPTYLINE HCL 25 MG PO TABS
25.0000 mg | ORAL_TABLET | Freq: Every day | ORAL | Status: DC
  Administered 2023-01-19 – 2023-01-22 (×4): 25 mg via ORAL
  Filled 2023-01-19 (×4): qty 1

## 2023-01-19 MED ORDER — GABAPENTIN 300 MG PO CAPS
600.0000 mg | ORAL_CAPSULE | Freq: Three times a day (TID) | ORAL | Status: DC
  Administered 2023-01-19 – 2023-01-20 (×3): 600 mg via ORAL
  Filled 2023-01-19 (×3): qty 2

## 2023-01-19 NOTE — Plan of Care (Signed)
Behavioral Plan Guideline Note  Patient Details  Name: Shawn Mills MRN: [id number] Date of Birth: 07-17-1980   Behavioral Plan:    [place] Level VI-Confused, Appropriate  Behavior to decrease/eliminate Decrease verbal agitation with staff; Increase participation; Decrease impulsivity; Decrease elopement risk; Decrease restlessness; Decrease impulsivity with toileting   Decrease pulling at lines/tubes N/A      Decrease removal of orthoses  N/A    Improved adherence to precautions N/A  Changes to environment Lights on, blinds open during the day, off and closed at night; Place signs in room to remind pt to call for assistance     Minimize in room stimulation Limit visitors to no more than 2 at a time     Interventions Telesitter; Bed alarm setting; # of rails up; Timed toileting Alarm waist belt     Restraints N/A   Bed alarm setting Medium sensitivity     # of rails up 3 rails 1 upper side rail and 2 lower side rail    Therapy schedule adjustments N/A   Recommendations for interactions with patient Remain calm and reduce environment stimulation with agitation; Redirect behaviors to alternate tasks/topics; DO NOT argue with patient; Offer toileting with every interaction    In Attendance at Behavior Plan Meeting OT;PT;SLP;RN       This behavior plan was developed with this patient's interdisciplinary team. It will be updated weekly on the day of conference or as needed with changing behaviors to keep the patient and staff safe. A copy of the Behavior plan will be posted in the patient room, at the RN station with the charge RN and in the patient medical record. If you have any questions or need clarification, please feel free to discuss with the interdisciplinary team mentioned above.  Jearld Adjutant 01/19/2023, 8:21 AM

## 2023-01-19 NOTE — Progress Notes (Signed)
Occupational Therapy TBI Note  Patient Details  Name: Shawn Mills MRN: [id number] Date of Birth: 12-21-1980  Today's Date: 01/19/2023 OT Individual Time: 7829-5621 OT Individual Time Calculation (min): 73 min    Short Term Goals: Week 3:  OT Short Term Goal 1 (Week 3): STG= LTG d/t ELOS  Skilled Therapeutic Interventions/Progress Updates:   Pt received sitting in the recliner with un-rated chronic back pain, RN had just administered pain medication. Pt required cueing to be encouraged to take shower, arguing with therapist but in a playful manner. He completed functional mobility around the room to gather items for shower with distant (S). Improved awareness as he requested privacy with shower. He required cueing for seated level doffing of pants/shoes but then also ignored this cueing. He completed shower standing with distant intermittent (S).  He dressed in standing despite OT cueing to sit, again. He continues to demonstrate reduced safety awareness. He completed functional mobility to the therapy gym with (S). He transitioned into prone on the mat for myofascial release of his lower back for pain management and therefore improved participation/independence in ADLs. Manual myofascial release and then switched to small firm ball. He was able to direct care and reported great improvement in symptoms. Pt returned demonstration of self myofascial release- supine on ball. He transitioned to sitting EOM and used Theracane to provide more self myofascial release. Provided education on carryover to home and pt was able to complete with min cueing. He then engaged in short term memory activity, again with WRAP techniques used to facilitate memory strategies. Poor attention and engagement with task, requiring mod cueing overall to remember 4 word sequence. He returned to his room and completed toileting with (S). Pt was left sitting up in the recliner with all needs met, chair alarm set, and call bell  within reach.    Therapy Documentation Precautions:  Precautions Precautions: Fall Precaution Comments: enclosure bed Restrictions Weight Bearing Restrictions: No  Agitated Behavior Scale: TBI Observation Details Observation Environment: CIR Start of observation period - Date: 01/19/23 Start of observation period - Time: 1300 End of observation period - Date: 01/19/23 End of observation period - Time: 1415 Agitated Behavior Scale (DO NOT LEAVE BLANKS) Short attention span, easy distractibility, inability to concentrate: Present to a slight degree Impulsive, impatient, low tolerance for pain or frustration: Present to a slight degree Uncooperative, resistant to care, demanding: Present to a slight degree Violent and/or threatening violence toward people or property: Absent Explosive and/or unpredictable anger: Absent Rocking, rubbing, moaning, or other self-stimulating behavior: Absent Pulling at tubes, restraints, etc.: Absent Wandering from treatment areas: Absent Restlessness, pacing, excessive movement: Absent Repetitive behaviors, motor, and/or verbal: Absent Rapid, loud, or excessive talking: Absent Sudden changes of mood: Absent Easily initiated or excessive crying and/or laughter: Absent Self-abusiveness, physical and/or verbal: Absent Agitated behavior scale total score: 17  Therapy/Group: Individual Therapy  Crissie Reese 01/19/2023, 1:45 PM

## 2023-01-19 NOTE — Progress Notes (Signed)
Physical Therapy TBI Note  Patient Details  Name: Shawn Mills MRN: [id number] Date of Birth: 04/03/1981  Today's Date: 01/19/2023 PT Individual Time: 1447-1530 PT Individual Time Calculation (min): 43 min   Short Term Goals: Week 2:  PT Short Term Goal 1 (Week 2): STGs=LTGs secondary to ELOS  Skilled Therapeutic Interventions/Progress Updates: Pt presents sitting in recliner and agreeable to therapy.  Pt states pain to LB but unable to quantify in numeric terms!  Pt transfers sit to stand w/ supervision, but impulsive.  Pt amb > 200' w/o AD and noted wide-based support, somewhat waddling 2/2 pain.  Pt performs SKTC w/ 1' hold to each LE, cues for Holding R leg behind knee 2/2 pain.  Pt performed seated shoulder press 3 x 15 w/ 2# weighted bar, emphasis on ab recruitment for trunk stability, and then standing bicep curl w/ 4# weighted bar .  Pt returned to room and into BR to change pants.  Pt amb out w/o footwear on and educated on need for shoes in hospital.  Pt returned to recliner w/ chair alarm on and all needs in reach.     Therapy Documentation Precautions:  Precautions Precautions: Fall Precaution Comments: enclosure bed Restrictions Weight Bearing Restrictions: No General:   Vital Signs: Therapy Vitals Temp: 97.6 F (36.4 C) Temp Source: Oral Pulse Rate: 87 Resp: 19 BP: 117/75 Patient Position (if appropriate): Sitting Oxygen Therapy SpO2: 100 % O2 Device: Room Air Pain:c/o LBP but not quantified.   Agitated Behavior Scale: TBI Observation Details Observation Environment: CIR Start of observation period - Date: 01/19/23 Start of observation period - Time: 1300 End of observation period - Date: 01/19/23 End of observation period - Time: 1415 1447-1530 Agitated Behavior Scale (DO NOT LEAVE BLANKS) Short attention span, easy distractibility, inability to concentrate: Present to a slight degree Impulsive, impatient, low tolerance for pain or frustration: Present  to a slight degree Uncooperative, resistant to care, demanding: Present to a slight degree Violent and/or threatening violence toward people or property: Absent Explosive and/or unpredictable anger: Absent Rocking, rubbing, moaning, or other self-stimulating behavior: Absent Pulling at tubes, restraints, etc.: Absent Wandering from treatment areas: Absent Restlessness, pacing, excessive movement: Absent Repetitive behaviors, motor, and/or verbal: Absent Rapid, loud, or excessive talking: Absent Sudden changes of mood: Absent Easily initiated or excessive crying and/or laughter: Absent Self-abusiveness, physical and/or verbal: Absent Agitated behavior scale total score: 17    Therapy/Group: Individual Therapy  [name] 01/19/2023, 3:51 PM

## 2023-01-19 NOTE — Progress Notes (Signed)
Speech Language Pathology Weekly Progress and Session Note  Patient Details  Name: Shawn Mills MRN: [id number] Date of Birth: 1980/11/11  Beginning of progress report period: January 12, 2023 End of progress report period: January 19, 2023  Today's Date: 01/19/2023 SLP Individual Time: 4098-1191 SLP Individual Time Calculation (min): 40 min  Short Term Goals: Week 2: SLP Short Term Goal 1 (Week 2): Pt will utilize external aids to answer orientation questions Min verbal cues. SLP Short Term Goal 1 - Progress (Week 2): Met SLP Short Term Goal 2 (Week 2): Patient will demonstrate sustained attention to functional tasks for 15 minutes with Mod verbal cues for redirection. SLP Short Term Goal 2 - Progress (Week 2): Met SLP Short Term Goal 3 (Week 2): Patient will demonstrate functional problem solving for basic and familiar tasks with Min verbal cues. SLP Short Term Goal 3 - Progress (Week 2): Met SLP Short Term Goal 4 (Week 2): Patient will utilize word-finding strategies during structured language tasks with Mod verbal cues. SLP Short Term Goal 4 - Progress (Week 2): Met    New Short Term Goals: Week 3: SLP Short Term Goal 1 (Week 3): STGs=LTGs due to ELOS  Weekly Progress Updates: Patient has made functional gains and has met 4 of 4 STGs this reporting period. Currently, patient demonstrates behaviors consistent with a Rancho Level VI and requires overall Min-Mod A multimodal cues to complete functional and familiar tasks safely in regards to problem solving, emergent awareness, sustained attention, and recall of daily information. Patient demonstrates intermittent language of confusion but his overall ability to verbally communicate wants/needs and participate in a mildly abstract conversation have improved with improved word-finding noted. Patient also demonstrates improved awareness of verbal errors. Patient is now out of the enclosure bed and is demonstrating appropriate safety awareness with  use of call bell. Patient and family education ongoing. Patient would benefit from continued skilled SLP intervention to maximize his cognitive-linguistic functioning and overall functional independence prior to discharge.      Intensity: Minumum of 1-2 x/day, 30 to 90 minutes Frequency: 1 to 3 out of 7 days Duration/Length of Stay: 01/23/23 Treatment/Interventions: Cognitive remediation/compensation;Environmental controls;Speech/Language facilitation;Cueing hierarchy;Functional tasks;Therapeutic Activities;Patient/family education;Internal/external aids   Daily Session  Skilled Therapeutic Interventions:  Skilled treatment session focused on cognitive-linguistic goals. Upon arrival, patient was awake in the recliner and reported lower back pain. Nursing made aware and administered medications. Patient mildly impulsive throughout session and standing without warning in order to walk around his room to "stretch" his back. Patient's fiance present briefly to drop off clean clothes and bring coffee. Patient spilled coffee and requested to change clothes. Patient ambulated to the bathroom and donned new clothes but declined assistance from SLP. SLP facilitated session by providing Mod-Max A verbal cues for specificity during a verbal description task as patient continued to report, " I have one of these" as a semantic clue throughout task. Patient also with intermittent neologisms with improved awareness and Min verbal cues to self-correct. Patient left upright in the recliner with alarm on and all needs within reach. Continue with current plan of care.    Pain Lower back pain Nursing made aware and administered medications  ABS: 18  Therapy/Group: Individual Therapy  Micayla Brathwaite 01/19/2023, 10:08 AM

## 2023-01-19 NOTE — Progress Notes (Signed)
Occupational Therapy Weekly Progress Note  Patient Details  Name: Shawn Mills MRN: [id number] Date of Birth: 1980-09-09  Beginning of progress report period: 01/12/23 End of progress report period: 01/19/23  Today's Date: 01/19/2023 OT Individual Time: 4098-1191 OT Individual Time Calculation (min): 55 min    Patient has met 3 of 3 short term goals.  Shawn Mills has made great cognitive improvement this week. His behaviors are consistent with a RLAS VI with a considerable reduction in agitation/aggressive behaviors. His R knee and hip pain has been limiting with both mobility and more dynamic balance tasks- MD is working on identifying cause but this is a chronic issue. He is able to complete his full ADL routine with only min cueing for safety awareness. Family education has been completed with his SO Shawn Mills and mother Shawn Mills. Emphasis placed on safety awareness and judgement.   Patient continues to demonstrate the following deficits: muscle weakness, decreased cardiorespiratoy endurance, decreased initiation, decreased attention, decreased awareness, decreased problem solving, decreased safety awareness, decreased memory, and delayed processing, and decreased standing balance and decreased balance strategies and therefore will continue to benefit from skilled OT intervention to enhance overall performance with BADL and iADL.  Patient progressing toward long term goals..  Continue plan of care.  OT Short Term Goals Week 2:  OT Short Term Goal 1 (Week 2): Pt will complete full ADL routine with no more than min cueing for safety OT Short Term Goal 1 - Progress (Week 2): Met OT Short Term Goal 2 (Week 2): Pt will reduce agitation as evidenced by 3 session with ABS <28 OT Short Term Goal 2 - Progress (Week 2): Met OT Short Term Goal 3 (Week 2): Pt will demo improved orientation via external cues, identifying place and time with no more than min cueing OT Short Term Goal 3 - Progress (Week 2):  Met Week 3:  OT Short Term Goal 1 (Week 3): STG= LTG d/t ELOS  Skilled Therapeutic Interventions/Progress Updates:    Pt received sitting in the recliner c/o back pain, chronic, but irritable from it. He reports pain medication has been delivered but is inadequate. Mobility and moist heat pack used as pain intervention. Pt completed functional mobility duirng the session with close (S). He was perseverative on back pain but agreeable to session. In the gym he sat with a moist heat pack against his lower back for pain relief while completing cognitive retraining task focusing on selective attention to task, sequencing, spatial orientation, and working memory. He was intermittently distracted by pain and required mobility for pain relief breaks. Discussed current pain regiment within OT scope. Memory task completed with 4-5 item lists, cueing for memory strategies, including WRAP principles.Pt able to utilize with min-mod cueing. Memory often limited by attention deficits. Pt pacing throughout this portion of the session 2/2 back pain. He returned to his room and was left sitting up in the recliner with the chair alarm set, telesitter on.   ABS 18  Therapy Documentation Precautions:  Precautions Precautions: Fall  Therapy/Group: Individual Therapy  Shawn Mills 01/19/2023, 7:59 AM

## 2023-01-19 NOTE — Progress Notes (Addendum)
PROGRESS NOTE   Subjective/Complaints:  Still having a lot of right low back pain with radiation to right leg. Denies knee pain but rather states pain is radiating from back down to knee, almost electric-like in quality. Tells me it started after an accident in 2018    Objective:   No results found. No results for input(s): "WBC", "HGB", "HCT", "PLT" in the last 72 hours. No results for input(s): "NA", "K", "CL", "CO2", "GLUCOSE", "BUN", "CREATININE", "CALCIUM" in the last 72 hours.  Intake/Output Summary (Last 24 hours) at 01/19/2023 1028 Last data filed at 01/19/2023 0713 Gross per 24 hour  Intake 476 ml  Output --  Net 476 ml        Physical Exam: Vital Signs Blood pressure 106/63, pulse 81, temperature 97.7 F (36.5 C), resp. rate 18, height 5\' 10"  (1.778 m), weight 92.1 kg, SpO2 100%.  Constitutional: No distress . Vital signs reviewed. HEENT: NCAT, EOMI, oral membranes moist Neck: supple Cardiovascular: RRR without murmur. No JVD    Respiratory/Chest: CTA Bilaterally without wheezes or rales. Normal effort    GI/Abdomen: BS +, non-tender, non-distended Ext: no clubbing, cyanosis, or edema Psych: pleasant and cooperative, still distracted  MsK: moves all extremities antigravity, able to sit in bed unassisted.   TTP on right lumbar paraspinals as well as PSIS, lesser so at medial knee   Skin : + facial rash - unchanged Neuro: Oriented, distracted, very tangential, more redirectable. Less confabulation? Moves all 4's. Sensory deficit R lateral thigh and calf, into dorsal foot-hyperesthesia     Assessment/Plan: 1. Functional deficits which require 3+ hours per day of interdisciplinary therapy in a comprehensive inpatient rehab setting. Physiatrist is providing close team supervision and 24 hour management of active medical problems listed below. Physiatrist and rehab team continue to assess barriers to  discharge/monitor patient progress toward functional and medical goals  Care Tool:  Bathing    Body parts bathed by patient: Right arm, Left arm, Chest, Abdomen, Front perineal area, Buttocks, Right upper leg, Left upper leg, Face, Left lower leg, Right lower leg         Bathing assist Assist Level: Minimal Assistance - Patient > 75%     Upper Body Dressing/Undressing Upper body dressing   What is the patient wearing?: Pull over shirt    Upper body assist Assist Level: Minimal Assistance - Patient > 75%    Lower Body Dressing/Undressing Lower body dressing      What is the patient wearing?: Pants     Lower body assist Assist for lower body dressing: Minimal Assistance - Patient > 75%     Toileting Toileting    Toileting assist Assist for toileting: Supervision/Verbal cueing     Transfers Chair/bed transfer  Transfers assist     Chair/bed transfer assist level: Contact Guard/Touching assist     Locomotion Ambulation   Ambulation assist      Assist level: Contact Guard/Touching assist Assistive device: No Device Max distance: 200   Walk 10 feet activity   Assist     Assist level: Contact Guard/Touching assist Assistive device: No Device   Walk 50 feet activity   Assist    Assist  level: Contact Guard/Touching assist Assistive device: No Device    Walk 150 feet activity   Assist    Assist level: Contact Guard/Touching assist Assistive device: No Device    Walk 10 feet on uneven surface  activity   Assist     Assist level: Minimal Assistance - Patient > 75%     Wheelchair     Assist Is the patient using a wheelchair?: No             Wheelchair 50 feet with 2 turns activity    Assist            Wheelchair 150 feet activity     Assist          Blood pressure 106/63, pulse 81, temperature 97.7 F (36.5 C), resp. rate 18, height 5\' 10"  (1.778 m), weight 92.1 kg, SpO2 100%.   Medical Problem  List and Plan:  1. 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 Rancho V-VI but improving orientation. Does well with non-dynamic movements; lost balance in the shower this week. Can't tolerate frustration or attention for complex tasks. Perseverates on wanting to leave. Per SLP can redirect somewhat, working on external aides. - 7/12 sleeping better out of enclosure bed  2.  Antithrombotics: -DVT/anticoagulation:  Pharmaceutical: Lovenox 40mg  BID             -antiplatelet therapy: Aspirin 81 mg daily  3. Pain Management: Oxycodone as needed,   - Robaxin 1000 mg 3 times daily as needed added for right calf pain, spasm -01/06/23 reported HA to nursing, CT head neg yesterday, will start topamax 25mg  every day PRN for now -01/07/23 no complaints today, cont regimen -7-1: Scheduled Topamax due to recurrent headaches, improved - 7/2: Start gabapentin 300 mg 3 times daily for chronic alcohol use, psychomotor agitation, and recurrent headaches; if does well, may consider changing Topamax to as needed -7/3: Increase gabapentin to 600 mg 3 times daily.  Has increased use of Robaxin and as needed oxycodone today. -7-4: Scheduled Tylenol 1000 mg 3 times daily, as well as Robaxin 1000 mg 3 times daily for ongoing right calf pain.  On chart review, appears to be chronic at least since 2022, MRI back at that time did not show significant nerve impingement or spinal stenosis.  Could consider duplex of right calf, however with ongoing agitation do not feel patient would tolerate and low index of suspicion for DVT, on prophylaxis, continue monitoring. -7/5: X-ray right knee shows no apparent fracture or arthritis;  read pending.  Suspect more right hemisensory loss and paresthesias secondary to TBI.  Per patient request, we will schedule oxycodone 5 mg 3 times daily.  Increase gabapentin to 800 mg 3 times daily, and add Elavil 25 mg nightly for neuropathy.  Change Robaxin to as needed.  If pain improves over the weekend, would switch oxycodone back to as needed.  If further pain control needed, could uptitrate Elavil versus add Lyrica. -01/13/23 pain seems controlled, but somnolent this morning; will monitor for now, may be able to go back to PRN oxycodone dosing tomorrow -01/14/23  R knee/hamstring pain and tightness, will order Kpad for now given multiple recent med adjustments-- leave scheduled oxy for now. Advised trying robaxin. Might need to consider stretches to see if hamstring tightness is part of it? If he will cooperate.  - 7/9: Improved reports of pain with scheduled baclofen, areas of greatest pain over right paraspinals, PSIS, and SI joints.  Lidocaine patches daily to right back, and increase baclofen to 15 mg 3 times daily - 7/10: Family endorses R knee getting caught with fall; ?MCL / medial meniscus pathology based on exam-- -7/12 spoke with therapy and pt. Right knee doing fairly well. Main complaint is back with likely radiculopathy (acute on chronic sx). MRI from 12/22 performed with hx of numbness in the right leg--doesn't demontrate any foraminal stenosis on right  -will initiate trial of lyrica 25mg  tid  -reduce gabapentin to 600mg  tid with plan to wean off potentially  -reduce amitriptyline to 25mg  at bedtime  -needs kpad for back--not in room  4. Mood/Behavior/Sleep: Klonopin 0.25 mg twice daily, Ritalin 10 mg twice daily, Inderal 20 mg 3 times daily             -antipsychotic agents: Seroquel 100 mg every morning and 200 mg nightly              - Sleep log 6/28              - veil bed for impulsivity, DC IV on admission d/t limiting lines/drains -01/06/23 agitated at times, wants to leave,  but remains in bed and didn't become aggressive; keep enclosure bed in place for now; has ativan 1mg  q6h PRN for agitation -01/07/23 less agitated but found in hallway unattended, advised nursing staff that he needs to be monitored closer, don't think he's ready to be released from vail bed yet.  -7-1: Continue veil bed.   -7-2: Add TeleSitter when available to allow patient with bed unzipped, in chair as able to prevent agitation.  See note 7-2 for details of discussion with family, will discontinue Ritalin today due to history of causing violent agitation and transition from Seroquel 100 mg every morning/200 mg every afternoon to 100 mg twice daily, add trazodone 100 mg nightly to promote sleep.  Will wean antipsychotics as able, as I do not feel they are helping at this time and patient's agitation is likely considerably attributable to chronic alcohol use. continue sleep log. -7-3: Sleep unchanged with transition from Seroquel 200 mg to 100 mg plus trazodone 100 mg nightly.  Increasing gabapentin as above, may consider resuming 200 mg Seroquel nightly if no improvement.  Continuing to use intermittent Ativan for agitation. -7/4: Per nursing report, slept throughout the night last night.  Much improved!  Continue current management and wean antipsychotics as able, then benzodiazepines. -7-5: Sleep and behaviors much improved, some mildly improving Insight but still remains fairly impaired.  Somewhat lethargic and dysarthric today, concern for oversedation with increasing pain regimen as above.  Switch Seroquel to nightly only, trazodone to as needed, and add Elavil nightly as above.  Monitor closely with sleep log -01/13/23 slept poorly per pt, but with recent changes, will monitor overnight tonight.  -01/14/23 sleep improving - 7/10: Change trazadone 100 mg at bedtime to scheduled, seroquel to PRN   -7/12 sleeping better 5. Neuropsych/cognition: This patient is not capable of making decisions on his own  behalf.              - Rec neuropsych consult  - Patient  easily redirectable for now, if attempts AMA he does not have capacity for decision-making.  Has Ativan as needed for anxiety, agitation (see above) -7-1: Reduce standing Clonazepam to 0.125 mg BID; add standing Topomax 25 mg BID for mood and headaches; reduce Ritalin to 5 mg BID - 7/2: Getting PRN ativan nightly @ 2100; sleep log getting 7 hours awake at 2 am.  See above; discontinue Ritalin, down titrate Seroquel, add trazodone nightly, add gabapentin 300 mg 3 times daily, and increase Klonopin to 0.25 mg 3 times daily to target agitation due to discontinuation of chronic alcohol use; continue as needed Ativan and propranolol. -7-3: Increase in gabapentin as above; may transition from Topamax to Depakote in a.m. if no improvement in behaviors with increase gabapentin -7-4: Does appear improved with increase gabapentin, monitor for now -7-5: Gradually improving, does appear somewhat oversedated, likely from polypharmacy for pain control and agitation.  As above, requiring increased pain medications for right-sided pain; change Seroquel from twice daily to nightly, change trazodone from nightly to as needed, change Robaxin to as needed, change topiramate to as needed.  If no further severe agitation, can start to wean off nightly Seroquel and come down on scheduled clonazepam this weekend. -01/13/23 will decrease clonazepam to 0.25mg  BID from the TID dosing.  -01/14/23 less somnolent this morning, much calmer than before, monitor - 7/8: Improving appropriateness, insight; remains fairly impaired; wean sedating medicaitons as tolerated -Seroquel to 50 mg nightly PRN.     6. Skin/Wound Care: Routine skin checks 7. Fluids/Electrolytes/Nutrition/Dysphagia: Routine in and outs with follow-up chemistries--ordered weekly labs starting Monday 01/08/23              - Cortrak Dced 6/26  - eating 20-25% meals, encourage POs, continue supplements  - eating  0-25% meals; intermittently unsafe to eat d/t aggitation - reduce ritalin as above; patient states that he just does not like the food here.  Also states that he was drinking "a lot" of alcohol throughout the day prior to hospital admission, did not eat much at baseline.  He is not redirectable to eating more. - Ate 60% breakfast yesterday!  May benefit from transition from Seroquel to olanzapine, with other medication adjustments as above will hold off for now  - 7/4: Eating improved to >50% approx 1-2 meals per day  -    8.  Acute hypoxic respiratory failure.  Patient extubated 6/10. 9.  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  Maintained on low-dose aspirin  10.  Alcohol intoxication/history of alcohol abuse/tobacco dependence-Per family, was drinking 1 case per day of beer prior to admission.  Provide counseling.  Monitor for any signs of withdrawal.  - 7/1: Wean to Klonopin 0.125 mg BID -7-2: Significantly more agitation today, along with family discussion revealing significant alcohol dependence; Klonopin to 0.25 mg 3 times daily.  Also add nicotine patch to help prevent cigarette craving.  -7-3: Unchanged, monitor. - 7/8: No ativan since 7/6, reduce clonazepam to 0.125 mg BID - 7/9: Wean scheduled clonazepam over next 2 days. - 7/10: DC clonazepam; has PRN ativan  11.  MRSA pneumonia.  Resolved.  Contact precautions-d/c'd.  Antibiotic therapy completed.  12. Constipation: currently not on any bowel meds -01/07/23 no BM since 01/05/23, if no BM by tomorrow, may need to adjust regimen. Will add some PRNs (miralax, colace, sorbitol) - LBM  7/11   - schedule colace BID and miralax in AM     LOS: 15 days A FACE TO FACE EVALUATION WAS PERFORMED  Earna Coder  Ermalene Postin 01/19/2023, 10:28 AM

## 2023-01-20 MED ORDER — GABAPENTIN 400 MG PO CAPS
400.0000 mg | ORAL_CAPSULE | Freq: Three times a day (TID) | ORAL | Status: DC
  Administered 2023-01-20 – 2023-01-22 (×7): 400 mg via ORAL
  Filled 2023-01-20 (×7): qty 1

## 2023-01-20 MED ORDER — PREGABALIN 50 MG PO CAPS
50.0000 mg | ORAL_CAPSULE | Freq: Three times a day (TID) | ORAL | Status: DC
  Administered 2023-01-20 – 2023-01-23 (×9): 50 mg via ORAL
  Filled 2023-01-20 (×9): qty 1

## 2023-01-20 NOTE — Progress Notes (Signed)
PROGRESS NOTE   Subjective/Complaints:  Pt had reasonable night. Doesn't like bed. Back and right leg still uncomfortable but he thinks lyrica may have helped. No issues with tolerance  ROS: Patient denies fever, rash, sore throat, blurred vision, dizziness, nausea, vomiting, diarrhea, cough, shortness of breath or chest pain,   headache, or mood change.     Objective:   No results found. No results for input(s): "WBC", "HGB", "HCT", "PLT" in the last 72 hours. No results for input(s): "NA", "K", "CL", "CO2", "GLUCOSE", "BUN", "CREATININE", "CALCIUM" in the last 72 hours.  Intake/Output Summary (Last 24 hours) at 01/20/2023 0911 Last data filed at 01/20/2023 0820 Gross per 24 hour  Intake 1080 ml  Output --  Net 1080 ml        Physical Exam: Vital Signs Blood pressure 113/71, pulse 87, temperature 97.6 F (36.4 C), resp. rate 18, height 5\' 10"  (1.778 m), weight 92.1 kg, SpO2 100%.  Constitutional: No distress . Vital signs reviewed. HEENT: NCAT, EOMI, oral membranes moist Neck: supple Cardiovascular: RRR without murmur. No JVD    Respiratory/Chest: CTA Bilaterally without wheezes or rales. Normal effort    GI/Abdomen: BS +, non-tender, non-distended Ext: no clubbing, cyanosis, or edema Psych: pleasant and cooperative, still labile and distracted but better MsK: moves all extremities antigravity, able to sit in bed unassisted.   TTP on right lumbar paraspinals as well as PSIS, SLR +?   Skin : + facial rash - unchanged Neuro: Oriented, distracted, very tangential, more redirectable. Less confabulation? Moves all 4's. Sensory deficit R lateral thigh and calf, into dorsal foot-hyperesthesia     Assessment/Plan: 1. Functional deficits which require 3+ hours per day of interdisciplinary therapy in a comprehensive inpatient rehab setting. Physiatrist is providing close team supervision and 24 hour management of active  medical problems listed below. Physiatrist and rehab team continue to assess barriers to discharge/monitor patient progress toward functional and medical goals  Care Tool:  Bathing    Body parts bathed by patient: Right arm, Left arm, Chest, Abdomen, Front perineal area, Buttocks, Right upper leg, Left upper leg, Face, Left lower leg, Right lower leg         Bathing assist Assist Level: Minimal Assistance - Patient > 75%     Upper Body Dressing/Undressing Upper body dressing   What is the patient wearing?: Pull over shirt    Upper body assist Assist Level: Minimal Assistance - Patient > 75%    Lower Body Dressing/Undressing Lower body dressing      What is the patient wearing?: Pants     Lower body assist Assist for lower body dressing: Minimal Assistance - Patient > 75%     Toileting Toileting    Toileting assist Assist for toileting: Supervision/Verbal cueing     Transfers Chair/bed transfer  Transfers assist     Chair/bed transfer assist level: Contact Guard/Touching assist     Locomotion Ambulation   Ambulation assist      Assist level: Supervision/Verbal cueing Assistive device: No Device Max distance: 200   Walk 10 feet activity   Assist     Assist level: Supervision/Verbal cueing Assistive device: No Device   Walk 50  feet activity   Assist    Assist level: Supervision/Verbal cueing Assistive device: No Device    Walk 150 feet activity   Assist    Assist level: Supervision/Verbal cueing Assistive device: No Device    Walk 10 feet on uneven surface  activity   Assist     Assist level: Minimal Assistance - Patient > 75%     Wheelchair     Assist Is the patient using a wheelchair?: No             Wheelchair 50 feet with 2 turns activity    Assist            Wheelchair 150 feet activity     Assist          Blood pressure 113/71, pulse 87, temperature 97.6 F (36.4 C), resp. rate 18,  height 5\' 10"  (1.778 m), weight 92.1 kg, SpO2 100%.   Medical Problem List and Plan:  1. 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 [place] but improving orientation. Does well with non-dynamic movements; lost balance in the shower this week. Can't tolerate frustration or attention for complex tasks. Perseverates on wanting to leave. Per SLP can redirect somewhat, working on external aides. - 7/12-13 sleeping better out of enclosure bed  2.  Antithrombotics: -DVT/anticoagulation:  Pharmaceutical: Lovenox 40mg  BID             -antiplatelet therapy: Aspirin 81 mg daily  3. Pain Management: Oxycodone as needed,   - Robaxin 1000 mg 3 times daily as needed added for right calf pain, spasm -01/06/23 reported HA to nursing, CT head neg yesterday, will start topamax 25mg  every day PRN for now -01/07/23 no complaints today, cont regimen -7-1: Scheduled Topamax due to recurrent headaches, improved - 7/2: Start gabapentin 300 mg 3 times daily for chronic alcohol use, psychomotor agitation, and recurrent headaches; if does well, may consider changing Topamax to as needed -7/3: Increase gabapentin to 600 mg 3 times daily.  Has increased use of Robaxin and as needed oxycodone today. -7-4: Scheduled Tylenol 1000 mg 3 times daily, as well as Robaxin 1000 mg 3 times daily for ongoing right calf pain.  On chart review, appears to be chronic at least since 2022, MRI back at that time did not show significant nerve impingement or spinal stenosis.  Could consider duplex of right calf, however with ongoing agitation do not feel patient would tolerate and low index of suspicion for DVT, on prophylaxis, continue  monitoring. -7/5: X-ray right knee shows no apparent fracture or arthritis; read pending.  Suspect more right hemisensory loss and paresthesias secondary to TBI.  Per patient request, we will schedule oxycodone 5 mg 3 times daily.  Increase gabapentin to 800 mg 3 times daily, and add Elavil 25 mg nightly for neuropathy.  Change Robaxin to as needed.  If pain improves over the weekend, would switch oxycodone back to as needed.  If further pain control needed, could uptitrate Elavil versus add Lyrica. -01/13/23 pain seems controlled, but somnolent this morning; will monitor for now, may be able to go  back to PRN oxycodone dosing tomorrow -01/14/23 R knee/hamstring pain and tightness, will order Kpad for now given multiple recent med adjustments-- leave scheduled oxy for now. Advised trying robaxin. Might need to consider stretches to see if hamstring tightness is part of it? If he will cooperate.  - 7/9: Improved reports of pain with scheduled baclofen, areas of greatest pain over right paraspinals, PSIS, and SI joints.  Lidocaine patches daily to right back, and increase baclofen to 15 mg 3 times daily - 7/10: Family endorses R knee getting caught with fall; ?MCL / medial meniscus pathology based on exam-- -7/13  . Main complaint is back with likely radiculopathy (acute on chronic sx). MRI from 12/22 performed with hx of numbness in the right leg--doesn't demontrate any foraminal stenosis on right  - lyrica 25mg  tid seems to have helped. Will increase to 50mg  tid  -reduce gabapentin to 400mg  tid with plan to wean off potentially  -reduced amitriptyline to 25mg  at bedtime  -needs kpad for back--he's on wait list  4. Mood/Behavior/Sleep: Klonopin 0.25 mg twice daily, Ritalin 10 mg twice daily, Inderal 20 mg 3 times daily             -antipsychotic agents: Seroquel 100 mg every morning and 200 mg nightly              - Sleep log 6/28              - veil bed for impulsivity, DC IV on admission d/t limiting  lines/drains -01/06/23 agitated at times, wants to leave, but remains in bed and didn't become aggressive; keep enclosure bed in place for now; has ativan 1mg  q6h PRN for agitation -01/07/23 less agitated but found in hallway unattended, advised nursing staff that he needs to be monitored closer, don't think he's ready to be released from vail bed yet.  -7-1: Continue veil bed.   -7-2: Add TeleSitter when available to allow patient with bed unzipped, in chair as able to prevent agitation.  See note 7-2 for details of discussion with family, will discontinue Ritalin today due to history of causing violent agitation and transition from Seroquel 100 mg every morning/200 mg every afternoon to 100 mg twice daily, add trazodone 100 mg nightly to promote sleep.  Will wean antipsychotics as able, as I do not feel they are helping at this time and patient's agitation is likely considerably attributable to chronic alcohol use. continue sleep log. -7-3: Sleep unchanged with transition from Seroquel 200 mg to 100 mg plus trazodone 100 mg nightly.  Increasing gabapentin as above, may consider resuming 200 mg Seroquel nightly if no improvement.  Continuing to use intermittent Ativan for agitation. -7/4: Per nursing report, slept throughout the night last night.  Much improved!  Continue current management and wean antipsychotics as able, then benzodiazepines. -7-5: Sleep and behaviors much improved, some mildly improving Insight but still remains fairly impaired.  Somewhat lethargic and dysarthric today, concern for oversedation with increasing pain regimen as above.  Switch Seroquel to nightly only, trazodone to as needed, and add Elavil nightly as above.  Monitor closely with sleep log -01/13/23 slept poorly per pt, but with recent changes, will monitor overnight tonight.  -01/14/23 sleep improving - 7/10: Change trazadone 100 mg at bedtime to scheduled, seroquel to PRN   -7/12 sleeping better 5. Neuropsych/cognition:  This patient is not capable of making decisions on his own behalf.              -  Rec neuropsych consult  - Patient easily redirectable for now, if attempts AMA he does not have capacity for decision-making.  Has Ativan as needed for anxiety, agitation (see above) -7-1: Reduce standing Clonazepam to 0.125 mg BID; add standing Topomax 25 mg BID for mood and headaches; reduce Ritalin to 5 mg BID - 7/2: Getting PRN ativan nightly @ 2100; sleep log getting 7 hours awake at 2 am.  See above; discontinue Ritalin, down titrate Seroquel, add trazodone nightly, add gabapentin 300 mg 3 times daily, and increase Klonopin to 0.25 mg 3 times daily to target agitation due to discontinuation of chronic alcohol use; continue as needed Ativan and propranolol. -7-3: Increase in gabapentin as above; may transition from Topamax to Depakote in a.m. if no improvement in behaviors with increase gabapentin -7-4: Does appear improved with increase gabapentin, monitor for now -7-5: Gradually improving, does appear somewhat oversedated, likely from polypharmacy for pain control and agitation.  As above, requiring increased pain medications for right-sided pain; change Seroquel from twice daily to nightly, change trazodone from nightly to as needed, change Robaxin to as needed, change topiramate to as needed.  If no further severe agitation, can start to wean off nightly Seroquel and come down on scheduled clonazepam this weekend. -01/13/23 will decrease clonazepam to 0.25mg  BID from the TID dosing.  -01/14/23 less somnolent this morning, much calmer than before, monitor - 7/8: Improving appropriateness, insight; remains fairly impaired; wean sedating medicaitons as tolerated -Seroquel to 50 mg nightly PRN.     6. Skin/Wound Care: Routine skin checks 7. Fluids/Electrolytes/Nutrition/Dysphagia: pt now on regular diet              - 7/13 eating well. No new issues   8.  Acute hypoxic respiratory failure.  Patient extubated  6/10. 9.  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  Maintained on low-dose aspirin  10.  Alcohol intoxication/history of alcohol abuse/tobacco dependence-Per family, was drinking 1 case per day of beer prior to admission.  Provide counseling.  Monitor for any signs of withdrawal.  - 7/1: Wean to Klonopin 0.125 mg BID -7-2: Significantly more agitation today, along with family discussion revealing significant alcohol dependence; Klonopin to 0.25 mg 3 times daily.  Also add nicotine patch to help prevent cigarette craving.  -7-3: Unchanged, monitor. - 7/8: No ativan since 7/6, reduce clonazepam to 0.125 mg BID - 7/9: Wean scheduled clonazepam over next 2 days. - 7/10: DC clonazepam; has PRN ativan  11.  MRSA pneumonia.  Resolved.  Contact precautions-d/c'd.  Antibiotic therapy completed.  12. Constipation: currently not on any bowel meds -01/07/23 no BM since 01/05/23, if no BM by tomorrow, may need to adjust regimen. Will add some PRNs (miralax, colace, sorbitol) - LBM  7/12   - scheduled colace BID and miralax in AM     LOS: 16 days A FACE TO FACE EVALUATION WAS PERFORMED  Ranelle Oyster 01/20/2023, 9:11 AM

## 2023-01-21 NOTE — Progress Notes (Signed)
Occupational Therapy TBI Note  Patient Details  Name: Shawn Mills MRN: [id number] Date of Birth: [birth year]  Today's Date: 01/21/2023 OT Individual Time: 1356-1430 OT Individual Time Calculation (min): 34 min  and Today's Date: 01/21/2023 OT Missed Time: 10 Minutes Missed Time Reason: Other (comment) (Missed minutes due to delay in care.)   Short Term Goals: Week 3:  OT Short Term Goal 1 (Week 3): STG= LTG d/t ELOS  Skilled Therapeutic Interventions/Progress Updates:  Pt received sitting in recliner for skilled OT session with focus on functional mobility and cognition, and activity tolerance. Pt agreeable to interventions, demonstrating overall pleasant mood. Pt with un-rated pain in RLE, stating "it hurts even more when I'm sitting." OT offering intermediate rest breaks and positioning suggestions throughout session to address pain/fatigue and maximize participation/safety in session.   Pt politely requesting to change from jeans into shorts, pt steps into bathroom to change with distant supervision. Pt performs all ambulation with close supervision + no AD. In day room, pt tasked with series of tasks targeting problem solving, short-term memory, frustration tolerance, and attention to task. Detailed below: Sliding colored peg-board. Pt completes one moderately complex pattern with minimal cuing to problem solve ways to move pegs. Pt initiates second design, but is unable to carryover cues provided from previous design, becoming increasingly frustrated, therefore activity terminated.  Dual task activity using playing cards and colored cones. Pt tasked with recalling order of 2-3 cards to retreive/match on playing mat, pt completes without cuing for recall. Pt then performs similar activity with colored cones, recalling order of 3 cones and scanning around day room to retrieve.   Pt is able to participates in above activities in moderately distracting environment as music was playing and  another patient/therapist were present. Pt and OT briefly discuss current functioning, pt with some insight into memory problems, sharing girlfriend would be able to assist with finances/medication management at discharge.   Pt then ambulates around 4W side of rehab unit to locate series of 2-3 room numbers, pt requires cuing to utilize signage in hallway.   Pt remained sitting in recliner with posey belt activated with all immediate needs met at end of session. Pt continues to be appropriate for skilled OT intervention to promote further functional independence.   Therapy Documentation Precautions:  Precautions Precautions: Fall Precaution Comments: enclosure bed Restrictions Weight Bearing Restrictions: No  Agitated Behavior Scale: TBI Observation Details Observation Environment: CIR Start of observation period - Date: 01/21/23 Start of observation period - Time: 1356 End of observation period - Date: 01/21/23 End of observation period - Time: 1430 Agitated Behavior Scale (DO NOT LEAVE BLANKS) Short attention span, easy distractibility, inability to concentrate: Present to a slight degree Impulsive, impatient, low tolerance for pain or frustration: Present to a slight degree Uncooperative, resistant to care, demanding: Absent Violent and/or threatening violence toward people or property: Absent Explosive and/or unpredictable anger: Absent Rocking, rubbing, moaning, or other self-stimulating behavior: Absent Pulling at tubes, restraints, etc.: Absent Wandering from treatment areas: Absent Restlessness, pacing, excessive movement: Absent Repetitive behaviors, motor, and/or verbal: Absent Rapid, loud, or excessive talking: Absent Sudden changes of mood: Absent Easily initiated or excessive crying and/or laughter: Absent Self-abusiveness, physical and/or verbal: Absent Agitated behavior scale total score: 16   Therapy/Group: Individual Therapy  Lou Cal, OTR/L,  MSOT  01/21/2023, 6:42 AM

## 2023-01-21 NOTE — Progress Notes (Addendum)
PROGRESS NOTE   Subjective/Complaints:  Significant other in room with him. No new problems this morning. Right leg pain better with lyrica it appears. S.O. noted that he didn't complain about pain nearly as much. Pt appears more comfortable today.   ROS: Patient denies fever, rash, sore throat, blurred vision, dizziness, nausea, vomiting, diarrhea, cough, shortness of breath or chest pain,   headache, or mood change.      Objective:   No results found. No results for input(s): "WBC", "HGB", "HCT", "PLT" in the last 72 hours. No results for input(s): "NA", "K", "CL", "CO2", "GLUCOSE", "BUN", "CREATININE", "CALCIUM" in the last 72 hours.  Intake/Output Summary (Last 24 hours) at 01/21/2023 0958 Last data filed at 01/21/2023 0827 Gross per 24 hour  Intake 720 ml  Output --  Net 720 ml        Physical Exam: Vital Signs Blood pressure (!) 112/95, pulse 93, temperature 98.9 F (37.2 C), temperature source Oral, resp. rate 16, height 5\' 10"  (1.778 m), weight 92.1 kg, SpO2 100%.  Constitutional: No distress . Vital signs reviewed. HEENT: NCAT, EOMI, oral membranes moist Neck: supple Cardiovascular: RRR without murmur. No JVD    Respiratory/Chest: CTA Bilaterally without wheezes or rales. Normal effort    GI/Abdomen: BS +, non-tender, non-distended Ext: no clubbing, cyanosis, or edema Psych: pleasant and cooperative, still distracted but redirectable and pleasant  MsK: moves all extremities antigravity, able to sit in bed unassisted.   TTP on right lumbar paraspinals as well as PSIS, SLR equivocal. Some tenderness at right knee.    Skin : + facial rash - unchanged Neuro: Oriented, distracted, very tangential, more redirectable. Less confabulation  Moves all 4's. Sensory deficit R lateral thigh and calf, into dorsal foot-hyperesthesia     Assessment/Plan: 1. Functional deficits which require 3+ hours per day of  interdisciplinary therapy in a comprehensive inpatient rehab setting. Physiatrist is providing close team supervision and 24 hour management of active medical problems listed below. Physiatrist and rehab team continue to assess barriers to discharge/monitor patient progress toward functional and medical goals  Care Tool:  Bathing    Body parts bathed by patient: Right arm, Left arm, Chest, Abdomen, Front perineal area, Buttocks, Right upper leg, Left upper leg, Face, Left lower leg, Right lower leg         Bathing assist Assist Level: Minimal Assistance - Patient > 75%     Upper Body Dressing/Undressing Upper body dressing   What is the patient wearing?: Pull over shirt    Upper body assist Assist Level: Minimal Assistance - Patient > 75%    Lower Body Dressing/Undressing Lower body dressing      What is the patient wearing?: Pants     Lower body assist Assist for lower body dressing: Minimal Assistance - Patient > 75%     Toileting Toileting    Toileting assist Assist for toileting: Supervision/Verbal cueing     Transfers Chair/bed transfer  Transfers assist     Chair/bed transfer assist level: Contact Guard/Touching assist     Locomotion Ambulation   Ambulation assist      Assist level: Supervision/Verbal cueing Assistive device: No Device Max distance: 200  Walk 10 feet activity   Assist     Assist level: Supervision/Verbal cueing Assistive device: No Device   Walk 50 feet activity   Assist    Assist level: Supervision/Verbal cueing Assistive device: No Device    Walk 150 feet activity   Assist    Assist level: Supervision/Verbal cueing Assistive device: No Device    Walk 10 feet on uneven surface  activity   Assist     Assist level: Minimal Assistance - Patient > 75%     Wheelchair     Assist Is the patient using a wheelchair?: No             Wheelchair 50 feet with 2 turns activity    Assist             Wheelchair 150 feet activity     Assist          Blood pressure (!) 112/95, pulse 93, temperature 98.9 F (37.2 C), temperature source Oral, resp. rate 16, height 5\' 10"  (1.778 m), weight 92.1 kg, SpO2 100%.   Medical Problem List and Plan:  1. 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 [place] but improving orientation. Does well with non-dynamic movements; lost balance in the shower this week. Can't tolerate frustration or attention for complex tasks. Perseverates on wanting to leave. Per SLP can redirect somewhat, working on external aides. - 7/14 sleeping better. RLAS VI  2.  Antithrombotics: -DVT/anticoagulation:  Pharmaceutical: Lovenox 40mg  BID             -antiplatelet therapy: Aspirin 81 mg daily  3. Pain Management: Oxycodone as needed,   - Robaxin 1000 mg 3 times daily as needed added for right calf pain, spasm -01/06/23 reported HA to nursing, CT head neg yesterday, will start topamax 25mg  every day PRN for now -01/07/23 no complaints today, cont regimen -7-1: Scheduled Topamax due to recurrent headaches, improved - 7/2: Start gabapentin 300 mg 3 times daily for chronic alcohol use, psychomotor agitation, and recurrent headaches; if does well, may consider changing Topamax to as needed -7/3: Increase gabapentin to 600 mg 3 times daily.  Has increased use of Robaxin and as needed oxycodone today. -7-4: Scheduled Tylenol 1000 mg 3 times daily, as well as Robaxin 1000 mg 3 times daily for ongoing right calf pain.  On chart review, appears to be chronic at least since 2022, MRI back at that time did not show significant nerve impingement or spinal stenosis.   Could consider duplex of right calf, however with ongoing agitation do not feel patient would tolerate and low index of suspicion for DVT, on prophylaxis, continue monitoring. -7/5: X-ray right knee shows no apparent fracture or arthritis; read pending.  Suspect more right hemisensory loss and paresthesias secondary to TBI.  Per patient request, we will schedule oxycodone 5 mg 3 times daily.  Increase gabapentin to 800 mg 3 times daily, and add Elavil 25 mg nightly for neuropathy.  Change Robaxin to as needed.  If pain improves over the weekend, would switch oxycodone back to as needed.  If further pain  control needed, could uptitrate Elavil versus add Lyrica. -01/13/23 pain seems controlled, but somnolent this morning; will monitor for now, may be able to go back to PRN oxycodone dosing tomorrow -01/14/23 R knee/hamstring pain and tightness, will order Kpad for now given multiple recent med adjustments-- leave scheduled oxy for now. Advised trying robaxin. Might need to consider stretches to see if hamstring tightness is part of it? If he will cooperate.  - 7/9: Improved reports of pain with scheduled baclofen, areas of greatest pain over right paraspinals, PSIS, and SI joints.  Lidocaine patches daily to right back, and increase baclofen to 15 mg 3 times daily - 7/10: Family endorses R knee getting caught with fall; ?MCL / medial meniscus pathology based on exam-- -7/13-14  . Main complaint is back with likely radiculopathy (acute on chronic sx). MRI from 12/22 performed with hx of numbness in the right leg--doesn't demontrate any foraminal stenosis on right  - lyrica 25mg  tid  to 50mg  tid--leave as is today  -continue gabapentin at 400mg  tid with plan to wean off potentially  -reduced amitriptyline to 25mg  at bedtime  -needs kpad for back--he's on wait list  -looked at pelvic CT and no sign of pelvic/hip trauma or DJD either  -EMG/NCS as outpt? 4. Mood/Behavior/Sleep: Klonopin 0.25 mg twice daily,  Ritalin 10 mg twice daily, Inderal 20 mg 3 times daily             -antipsychotic agents: Seroquel 100 mg every morning and 200 mg nightly              - Sleep log 6/28              - veil bed for impulsivity, DC IV on admission d/t limiting lines/drains -01/06/23 agitated at times, wants to leave, but remains in bed and didn't become aggressive; keep enclosure bed in place for now; has ativan 1mg  q6h PRN for agitation -01/07/23 less agitated but found in hallway unattended, advised nursing staff that he needs to be monitored closer, don't think he's ready to be released from vail bed yet.  -7-1: Continue veil bed.   -7-2: Add TeleSitter when available to allow patient with bed unzipped, in chair as able to prevent agitation.  See note 7-2 for details of discussion with family, will discontinue Ritalin today due to history of causing violent agitation and transition from Seroquel 100 mg every morning/200 mg every afternoon to 100 mg twice daily, add trazodone 100 mg nightly to promote sleep.  Will wean antipsychotics as able, as I do not feel they are helping at this time and patient's agitation is likely considerably attributable to chronic alcohol use. continue sleep log. -7-3: Sleep unchanged with transition from Seroquel 200 mg to 100 mg plus trazodone 100 mg nightly.  Increasing gabapentin as above, may consider resuming 200 mg Seroquel nightly if no improvement.  Continuing to use intermittent Ativan for agitation. -7/4: Per nursing report, slept throughout the night last night.  Much improved!  Continue current management and wean antipsychotics as able, then benzodiazepines. -7-5: Sleep and behaviors much improved, some mildly improving Insight but still remains fairly impaired.  Somewhat lethargic and dysarthric today, concern for oversedation with increasing pain regimen as above.  Switch Seroquel to nightly only, trazodone to as needed, and add Elavil nightly as above.  Monitor closely with sleep  log -01/13/23 slept poorly per pt, but with recent changes, will monitor overnight tonight.  - 7/10: Change trazadone 100 mg at bedtime to  scheduled, seroquel to PRN   -7/13-14 sleeping well nowr 5. Neuropsych/cognition: This patient is not capable of making decisions on his own behalf.              - Rec neuropsych consult  - Patient easily redirectable for now, if attempts AMA he does not have capacity for decision-making.  Has Ativan as needed for anxiety, agitation (see above) -7-1: Reduce standing Clonazepam to 0.125 mg BID; add standing Topomax 25 mg BID for mood and headaches; reduce Ritalin to 5 mg BID - 7/2: Getting PRN ativan nightly @ 2100; sleep log getting 7 hours awake at 2 am.  See above; discontinue Ritalin, down titrate Seroquel, add trazodone nightly, add gabapentin 300 mg 3 times daily, and increase Klonopin to 0.25 mg 3 times daily to target agitation due to discontinuation of chronic alcohol use; continue as needed Ativan and propranolol. -7-3: Increase in gabapentin as above; may transition from Topamax to Depakote in a.m. if no improvement in behaviors with increase gabapentin -7-4: Does appear improved with increase gabapentin, monitor for now -7-5: Gradually improving, does appear somewhat oversedated, likely from polypharmacy for pain control and agitation.  As above, requiring increased pain medications for right-sided pain; change Seroquel from twice daily to nightly, change trazodone from nightly to as needed, change Robaxin to as needed, change topiramate to as needed.  If no further severe agitation, can start to wean off nightly Seroquel and come down on scheduled clonazepam this weekend. -01/13/23 will decrease clonazepam to 0.25mg  BID from the TID dosing.  -01/14/23 less somnolent this morning, much calmer than before, monitor - 7/8: Improving appropriateness, insight; remains fairly impaired; wean sedating medicaitons as tolerated -Seroquel to 50 mg nightly PRN.     6.  Skin/Wound Care: Routine skin checks 7. Fluids/Electrolytes/Nutrition/Dysphagia: pt now on regular diet              - 7/13 eating well. No new issues   8.  Acute hypoxic respiratory failure.  Patient extubated 6/10. 9.  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  Maintained on low-dose aspirin  10.  Alcohol intoxication/history of alcohol abuse/tobacco dependence-Per family, was drinking 1 case per day of beer prior to admission.  Provide counseling.  Monitor for any signs of withdrawal.  - 7/1: Wean to Klonopin 0.125 mg BID -7-2: Significantly more agitation today, along with family discussion revealing significant alcohol dependence; Klonopin to 0.25 mg 3 times daily.  Also add nicotine patch to help prevent cigarette craving.  -7-3: Unchanged, monitor. - 7/8: No ativan since 7/6, reduce clonazepam to 0.125 mg BID - 7/9: Wean scheduled clonazepam over next 2 days. - 7/10: DC clonazepam; has PRN ativan  11.  MRSA pneumonia.  Resolved.  Contact precautions-d/c'd.  Antibiotic therapy completed.  12. Constipation: currently not on any bowel meds -01/07/23 no BM since 01/05/23, if no BM by tomorrow, may need to adjust regimen. Will add some PRNs (miralax, colace, sorbitol) - LBM  7/12   - scheduled colace BID and miralax in AM     LOS: 17 days A FACE TO FACE EVALUATION WAS PERFORMED  Ranelle Oyster 01/21/2023, 9:58 AM

## 2023-01-21 NOTE — Plan of Care (Signed)
  Problem: Consults Goal: RH BRAIN INJURY PATIENT EDUCATION Description: Description: See Patient Education module for eduction specifics Outcome: Progressing   Problem: RH BOWEL ELIMINATION Goal: RH STG MANAGE BOWEL WITH ASSISTANCE Description: STG Manage Bowel with toileting Assistance. Outcome: Progressing Goal: RH STG MANAGE BOWEL W/MEDICATION W/ASSISTANCE Description: STG Manage Bowel with Medication with mod I Assistance. Outcome: Progressing   Problem: RH SAFETY Goal: RH STG ADHERE TO SAFETY PRECAUTIONS W/ASSISTANCE/DEVICE Description: STG Adhere to Safety Precautions With cues Assistance/Device. Outcome: Progressing   Problem: RH COGNITION-NURSING Goal: RH STG USES MEMORY AIDS/STRATEGIES W/ASSIST TO PROBLEM SOLVE Description: STG Uses Memory Aids/Strategies With cues /reminders Assistance to Problem Solve. Outcome: Progressing   Problem: RH PAIN MANAGEMENT Goal: RH STG PAIN MANAGED AT OR BELOW PT'S PAIN GOAL Description: < 4 with prns Outcome: Progressing   Problem: RH KNOWLEDGE DEFICIT BRAIN INJURY Goal: RH STG INCREASE KNOWLEDGE OF SELF CARE AFTER BRAIN INJURY Description: Patient and family will be able to manage care at discharge using educational resources independently Outcome: Progressing   Problem: Safety: Goal: Non-violent Restraint(s) Outcome: Progressing   Problem: Education: Goal: Understanding of CV disease, CV risk reduction, and recovery process will improve Outcome: Progressing Goal: Individualized Educational Video(s) Outcome: Progressing   Problem: Activity: Goal: Ability to return to baseline activity level will improve Outcome: Progressing   Problem: Cardiovascular: Goal: Ability to achieve and maintain adequate cardiovascular perfusion will improve Outcome: Progressing Goal: Vascular access site(s) Level 0-1 will be maintained Outcome: Progressing   Problem: Health Behavior/Discharge Planning: Goal: Ability to safely manage  health-related needs after discharge will improve Outcome: Progressing   

## 2023-01-22 ENCOUNTER — Other Ambulatory Visit (HOSPITAL_COMMUNITY): Payer: Self-pay

## 2023-01-22 ENCOUNTER — Telehealth (HOSPITAL_COMMUNITY): Payer: Self-pay | Admitting: Pharmacy Technician

## 2023-01-22 DIAGNOSIS — S069X1S Unspecified intracranial injury with loss of consciousness of 30 minutes or less, sequela: Secondary | ICD-10-CM

## 2023-01-22 LAB — BASIC METABOLIC PANEL
Anion gap: 8 (ref 5–15)
BUN: 6 mg/dL (ref 6–20)
CO2: 26 mmol/L (ref 22–32)
Calcium: 8.9 mg/dL (ref 8.9–10.3)
Chloride: 104 mmol/L (ref 98–111)
Creatinine, Ser: 0.75 mg/dL (ref 0.61–1.24)
GFR, Estimated: >60 mL/min (ref >60)
Glucose, Bld: 88 mg/dL (ref 70–99)
Potassium: 3.7 mmol/L (ref 3.5–5.1)
Sodium: 138 mmol/L (ref 135–145)

## 2023-01-22 LAB — CBC
HCT: 39 % (ref 39.0–52.0)
Hemoglobin: 13.3 g/dL (ref 13.0–17.0)
MCH: 33.5 pg (ref 26.0–34.0)
MCHC: 34.1 g/dL (ref 30.0–36.0)
MCV: 98.2 fL (ref 80.0–100.0)
Platelets: 418 10*3/uL — ABNORMAL HIGH (ref 150–400)
RBC: 3.97 MIL/uL — ABNORMAL LOW (ref 4.22–5.81)
RDW: 12.6 % (ref 11.5–15.5)
WBC: 6 10*3/uL (ref 4.0–10.5)
nRBC: 0 % (ref 0.0–0.2)

## 2023-01-22 MED ORDER — OXYCODONE HCL 5 MG PO TABS
5.0000 mg | ORAL_TABLET | Freq: Three times a day (TID) | ORAL | 0 refills | Status: DC
  Filled 2023-01-22: qty 30, 10d supply, fill #0

## 2023-01-22 MED ORDER — POLYETHYLENE GLYCOL 3350 17 G PO PACK: 17.0000 g | PACK | Freq: Every day | ORAL | Status: DC

## 2023-01-22 MED ORDER — BACLOFEN 10 MG PO TABS
15.0000 mg | ORAL_TABLET | Freq: Three times a day (TID) | ORAL | 0 refills | Status: DC
  Filled 2023-01-22: qty 135, 30d supply, fill #0
  Filled 2023-01-22: qty 90, 20d supply, fill #0

## 2023-01-22 MED ORDER — QUETIAPINE FUMARATE 50 MG PO TABS
50.0000 mg | ORAL_TABLET | Freq: Every evening | ORAL | 0 refills | Status: DC | PRN
  Filled 2023-01-22: qty 10, 10d supply, fill #0

## 2023-01-22 MED ORDER — TRAZODONE HCL 100 MG PO TABS
100.0000 mg | ORAL_TABLET | Freq: Every day | ORAL | 0 refills | Status: DC
  Filled 2023-01-22: qty 30, 30d supply, fill #0

## 2023-01-22 MED ORDER — GABAPENTIN 400 MG PO CAPS
400.0000 mg | ORAL_CAPSULE | Freq: Three times a day (TID) | ORAL | 0 refills | Status: DC
  Filled 2023-01-22: qty 90, 30d supply, fill #0

## 2023-01-22 MED ORDER — NICOTINE 14 MG/24HR TD PT24
MEDICATED_PATCH | TRANSDERMAL | 0 refills | Status: DC
  Filled 2023-01-22: qty 14, 14d supply, fill #0

## 2023-01-22 MED ORDER — FOLIC ACID 1 MG PO TABS
1.0000 mg | ORAL_TABLET | Freq: Every day | ORAL | 0 refills | Status: AC
  Filled 2023-01-22: qty 30, 30d supply, fill #0

## 2023-01-22 MED ORDER — ADULT MULTIVITAMIN W/MINERALS CH: 1.0000 | ORAL_TABLET | Freq: Every day | ORAL | Status: AC

## 2023-01-22 MED ORDER — OXYCODONE HCL 5 MG PO TABS
5.0000 mg | ORAL_TABLET | Freq: Four times a day (QID) | ORAL | 0 refills | Status: DC | PRN
Start: 2023-01-22 — End: 2023-01-31
  Filled 2023-01-22: qty 30, 8d supply, fill #0

## 2023-01-22 MED ORDER — DICLOFENAC SODIUM 1 % EX GEL
4.0000 g | Freq: Two times a day (BID) | CUTANEOUS | 0 refills | Status: DC
  Filled 2023-01-22: qty 100, 30d supply, fill #0
  Filled 2023-01-22: qty 200, 25d supply, fill #0

## 2023-01-22 MED ORDER — TOPIRAMATE 25 MG PO TABS
25.0000 mg | ORAL_TABLET | Freq: Two times a day (BID) | ORAL | 0 refills | Status: DC | PRN
  Filled 2023-01-22: qty 30, 15d supply, fill #0

## 2023-01-22 MED ORDER — LIDOCAINE 5 % EX PTCH
2.0000 | MEDICATED_PATCH | CUTANEOUS | 0 refills | Status: DC
Start: 2023-01-22 — End: 2023-05-30
  Filled 2023-01-22 (×2): qty 30, 15d supply, fill #0

## 2023-01-22 MED ORDER — OXYCODONE HCL 5 MG PO TABS
5.0000 mg | ORAL_TABLET | Freq: Four times a day (QID) | ORAL | Status: DC | PRN
  Administered 2023-01-22: 5 mg via ORAL
  Filled 2023-01-22: qty 1

## 2023-01-22 MED ORDER — ASPIRIN 81 MG PO CHEW: 81.0000 mg | CHEWABLE_TABLET | Freq: Every day | ORAL | Status: AC

## 2023-01-22 MED ORDER — AMITRIPTYLINE HCL 25 MG PO TABS
25.0000 mg | ORAL_TABLET | Freq: Every day | ORAL | 0 refills | Status: DC
  Filled 2023-01-22: qty 30, 30d supply, fill #0

## 2023-01-22 MED ORDER — PREGABALIN 50 MG PO CAPS
50.0000 mg | ORAL_CAPSULE | Freq: Three times a day (TID) | ORAL | 0 refills | Status: DC
Start: 2023-01-22 — End: 2023-02-28
  Filled 2023-01-22: qty 70, 24d supply, fill #0
  Filled 2023-01-22: qty 20, 6d supply, fill #0
  Filled 2023-01-22: qty 70, 24d supply, fill #0

## 2023-01-22 MED ORDER — GABAPENTIN 300 MG PO CAPS
300.0000 mg | ORAL_CAPSULE | Freq: Three times a day (TID) | ORAL | Status: DC
  Administered 2023-01-22 – 2023-01-23 (×2): 300 mg via ORAL
  Filled 2023-01-22 (×2): qty 1

## 2023-01-22 NOTE — Plan of Care (Signed)
  Problem: RH Awareness Goal: LTG: Patient will demonstrate awareness during functional activites type of (SLP) Description: LTG: Patient will demonstrate awareness during functional activites type of (SLP) 01/22/2023 1532 by Pati Gallo, CCC-SLP Outcome: Not Met (add Reason) 01/22/2023 1529 by Pati Gallo, CCC-SLP Outcome: Not Met (add Reason)   Problem: RH Cognition - SLP Goal: RH LTG Patient will demonstrate orientation with cues Description:  LTG:  Patient will demonstrate orientation to person/place/time/situation with cues (SLP)   Outcome: Completed/Met Flowsheets (Taken 01/05/2023 1208 by Ellery Plunk, CCC-SLP) LTG: Patient will demonstrate orientation using cueing (SLP): Minimal Assistance - Patient > 75%   Problem: RH Comprehension Communication Goal: LTG Patient will comprehend basic/complex auditory (SLP) Description: LTG: Patient will comprehend basic/complex auditory information with cues (SLP). Outcome: Completed/Met Flowsheets (Taken 01/05/2023 1208 by Ellery Plunk, CCC-SLP) LTG: Patient will comprehend auditory information with cueing (SLP): Minimal Assistance - Patient > 75%   Problem: RH Expression Communication Goal: LTG Patient will increase word finding of common (SLP) Description: LTG:  Patient will increase word finding of common objects/daily info/abstract thoughts with cues using compensatory strategies (SLP). Outcome: Completed/Met   Problem: RH Memory Goal: LTG Patient will demonstrate ability for day to day (SLP) Description: LTG:   Patient will demonstrate ability for day to day recall/carryover during cognitive/linguistic activities with assist  (SLP) Outcome: Completed/Met Goal: LTG Patient will use memory compensatory aids to (SLP) Description: LTG:  Patient will use memory compensatory aids to recall biographical/new, daily complex information with cues (SLP) Outcome: Completed/Met   Problem: RH Attention Goal: LTG Patient will  demonstrate this level of attention during functional activites (SLP) Description: LTG:  Patient will will demonstrate this level of attention during functional activites (SLP) Outcome: Completed/Met

## 2023-01-22 NOTE — Progress Notes (Signed)
Inpatient Rehabilitation Discharge Medication Review by a Pharmacist  A complete drug regimen review was completed for this patient to identify any potential clinically significant medication issues.  High Risk Drug Classes Is patient taking? Indication by Medication  Antipsychotic Yes Quetiapine - agitation  Anticoagulant No   Antibiotic No   Opioid Yes Oxycodone - prn pain  Antiplatelet No   Hypoglycemics/insulin No   Vasoactive Medication No   Chemotherapy No   Other Yes Amitriptyline - sleep Baclofen - muscle spasms Gabapentin - pain Lidocaine patch, pregablin - pain Topiramate - prn headaches Trazodone - sleep     Type of Medication Issue Identified Description of Issue Recommendation(s)  Drug Interaction(s) (clinically significant)     Duplicate Therapy     Allergy     No Medication Administration End Date     Incorrect Dose     Additional Drug Therapy Needed     Significant med changes from prior encounter (inform family/care partners about these prior to discharge).    Other       Clinically significant medication issues were identified that warrant physician communication and completion of prescribed/recommended actions by midnight of the next day:  No  Pharmacist comments:   Time spent performing this drug regimen review (minutes):  20 minutes  Thank you Okey Regal, PharmD

## 2023-01-22 NOTE — Progress Notes (Signed)
PROGRESS NOTE   Subjective/Complaints:  Still with radiating pain R buttocks to ankle , "medications are better"  ROS: Patient denies CP, SOB, N/V/D     Objective:   No results found. Recent Labs    01/22/23 0641  WBC 6.0  HGB 13.3  HCT 39.0  PLT 418*   Recent Labs    01/22/23 0641  NA 138  K 3.7  CL 104  CO2 26  GLUCOSE 88  BUN 6  CREATININE 0.75  CALCIUM 8.9    Intake/Output Summary (Last 24 hours) at 01/22/2023 1538 Last data filed at 01/22/2023 1304 Gross per 24 hour  Intake 1540 ml  Output --  Net 1540 ml        Physical Exam: Vital Signs Blood pressure 109/74, pulse 79, temperature 98.2 F (36.8 C), temperature source Oral, resp. rate 18, height 5\' 10"  (1.778 m), weight 92.1 kg, SpO2 97%.  Gen NAD Mood/affect appropriate  Cor RRR no murmur or ES Lungs CTA no wheezes Abd + BS soft NT TTP on right lumbar paraspinals as well as PSIS, SLR equivocal. Skin : + facial rash - unchanged Neuro: Oriented, distracted, very tangential, more redirectable. Less confabulation  Moves all 4's. Sensory deficit R lateral thigh and calf, into dorsal foot-hyperesthesia     Assessment/Plan: 1. Functional deficits which require 3+ hours per day of interdisciplinary therapy in a comprehensive inpatient rehab setting. Physiatrist is providing close team supervision and 24 hour management of active medical problems listed below. Physiatrist and rehab team continue to assess barriers to discharge/monitor patient progress toward functional and medical goals  Care Tool:  Bathing    Body parts bathed by patient: Right arm, Left arm, Chest, Abdomen, Front perineal area, Buttocks, Right upper leg, Left upper leg, Face, Left lower leg, Right lower leg         Bathing assist Assist Level: Supervision/Verbal cueing     Upper Body Dressing/Undressing Upper body dressing   What is the patient wearing?: Pull over  shirt    Upper body assist Assist Level: Independent    Lower Body Dressing/Undressing Lower body dressing      What is the patient wearing?: Pants     Lower body assist Assist for lower body dressing: Supervision/Verbal cueing     Toileting Toileting    Toileting assist Assist for toileting: Supervision/Verbal cueing     Transfers Chair/bed transfer  Transfers assist     Chair/bed transfer assist level: Independent     Locomotion Ambulation   Ambulation assist      Assist level: Supervision/Verbal cueing Assistive device: No Device Max distance: 200   Walk 10 feet activity   Assist     Assist level: Supervision/Verbal cueing Assistive device: No Device   Walk 50 feet activity   Assist    Assist level: Supervision/Verbal cueing Assistive device: No Device    Walk 150 feet activity   Assist    Assist level: Supervision/Verbal cueing Assistive device: No Device    Walk 10 feet on uneven surface  activity   Assist     Assist level: Minimal Assistance - Patient > 75%     Wheelchair  Assist Is the patient using a wheelchair?: No             Wheelchair 50 feet with 2 turns activity    Assist            Wheelchair 150 feet activity     Assist          Blood pressure 109/74, pulse 79, temperature 98.2 F (36.8 C), temperature source Oral, resp. rate 18, height 5\' 10"  (1.778 m), weight 92.1 kg, SpO2 97%.   Medical Problem List and Plan:  1. 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 Rancho V-VI but improving orientation. Does well with non-dynamic movements; lost balance in the shower this week.  Can't tolerate frustration or attention for complex tasks. Perseverates on wanting to leave. Per SLP can redirect somewhat, working on external aides. - 7/14 sleeping better. RLAS VI  2.  Antithrombotics: -DVT/anticoagulation:  Pharmaceutical: Lovenox 40mg  BID             -antiplatelet therapy: Aspirin 81 mg daily  3. Pain Management: Oxycodone as needed,   - Robaxin 1000 mg 3 times daily as needed added for right calf pain, spasm -01/06/23 reported HA to nursing, CT head neg yesterday, will start topamax 25mg  every day PRN for now -01/07/23 no complaints today, cont regimen -7-1: Scheduled Topamax due to recurrent headaches, improved - 7/2: Start gabapentin 300 mg 3 times daily for chronic alcohol use, psychomotor agitation, and recurrent headaches; if does well, may consider changing Topamax to as needed -7/3: Increase gabapentin to 600 mg 3 times daily.  Has increased use of Robaxin and as needed oxycodone today. -7-4: Scheduled Tylenol 1000 mg 3 times daily, as well as Robaxin 1000 mg 3 times daily for ongoing right calf pain.  On chart review, appears to be chronic at least since 2022, MRI back at that time did not show significant nerve impingement or spinal stenosis.  Could consider duplex of right calf, however with ongoing agitation do not feel patient would tolerate and low index of suspicion for DVT, on prophylaxis, continue monitoring. -7/5: X-ray right knee shows no apparent fracture or arthritis; read pending.  Suspect more right hemisensory loss and paresthesias secondary to TBI.  Per patient request, we will schedule oxycodone 5 mg 3 times daily.  Increase gabapentin to 800 mg 3 times daily, and add Elavil 25 mg nightly for neuropathy.  Change Robaxin to as needed.  If pain improves over the weekend, would switch oxycodone back to as needed.  If further pain control needed, could uptitrate Elavil versus add Lyrica. -01/13/23 pain seems controlled, but somnolent this morning; will monitor  for now, may be able to go back to PRN oxycodone dosing tomorrow -01/14/23 R knee/hamstring pain and tightness, will order Kpad for now given multiple recent med adjustments-- leave scheduled oxy for now. Advised trying robaxin. Might need to consider stretches to see if hamstring tightness is part of it? If he will cooperate.  - 7/9: Improved reports of pain with scheduled baclofen, areas of greatest pain over right paraspinals, PSIS, and SI joints.  Lidocaine  patches daily to right back, and increase baclofen to 15 mg 3 times daily - 7/10: Family endorses R knee getting caught with fall; ?MCL / medial meniscus pathology based on exam-- -7/13-14  . Main complaint is back with likely radiculopathy (acute on chronic sx). MRI from 12/22 performed with hx of numbness in the right leg--doesn't demontrate any foraminal stenosis on right  - lyrica 25mg  tid  to 50mg  tid--leave as is today  -continue gabapentin at 400mg  tid may reduce to 300mg  since starting increased dose Lyrica  -reduced amitriptyline to 25mg  at bedtime  -needs kpad for back--he's on wait list  -looked at pelvic CT and no sign of pelvic/hip trauma or DJD either  -EMG/NCS as outpt? 4. Mood/Behavior/Sleep: Klonopin 0.25 mg twice daily, Ritalin 10 mg twice daily, Inderal 20 mg 3 times daily             -antipsychotic agents: Seroquel 100 mg every morning and 200 mg nightly              - Sleep log 6/28              - veil bed for impulsivity, DC IV on admission d/t limiting lines/drains -01/06/23 agitated at times, wants to leave, but remains in bed and didn't become aggressive; keep enclosure bed in place for now; has ativan 1mg  q6h PRN for agitation -01/07/23 less agitated but found in hallway unattended, advised nursing staff that he needs to be monitored closer, don't think he's ready to be released from vail bed yet.  -7-1: Continue veil bed.   -7-2: Add TeleSitter when available to allow patient with bed unzipped, in chair as able to  prevent agitation.  See note 7-2 for details of discussion with family, will discontinue Ritalin today due to history of causing violent agitation and transition from Seroquel 100 mg every morning/200 mg every afternoon to 100 mg twice daily, add trazodone 100 mg nightly to promote sleep.  Will wean antipsychotics as able, as I do not feel they are helping at this time and patient's agitation is likely considerably attributable to chronic alcohol use. continue sleep log. -7-3: Sleep unchanged with transition from Seroquel 200 mg to 100 mg plus trazodone 100 mg nightly.  Increasing gabapentin as above, may consider resuming 200 mg Seroquel nightly if no improvement.  Continuing to use intermittent Ativan for agitation. -7/4: Per nursing report, slept throughout the night last night.  Much improved!  Continue current management and wean antipsychotics as able, then benzodiazepines. -7-5: Sleep and behaviors much improved, some mildly improving Insight but still remains fairly impaired.  Somewhat lethargic and dysarthric today, concern for oversedation with increasing pain regimen as above.  Switch Seroquel to nightly only, trazodone to as needed, and add Elavil nightly as above.  Monitor closely with sleep log -01/13/23 slept poorly per pt, but with recent changes, will monitor overnight tonight.  - 7/10: Change trazadone 100 mg at bedtime to scheduled, seroquel to PRN   -7/13-14 sleeping well nowr 5. Neuropsych/cognition: This patient is not capable of making decisions on his own behalf.              - Rec neuropsych consult  - Patient easily redirectable for now, if attempts AMA he does not have capacity for decision-making.  Has Ativan as needed for anxiety, agitation (see above) -7-1: Reduce standing Clonazepam to 0.125 mg BID; add standing Topomax 25 mg BID for mood and headaches; reduce Ritalin to 5 mg BID - 7/2:  Getting PRN ativan nightly @ 2100; sleep log getting 7 hours awake at 2 am.  See above;  discontinue Ritalin, down titrate Seroquel, add trazodone nightly, add gabapentin 300 mg 3 times daily, and increase Klonopin to 0.25 mg 3 times daily to target agitation due to discontinuation of chronic alcohol use; continue as needed Ativan and propranolol. -7-3: Increase in gabapentin as above; may transition from Topamax to Depakote in a.m. if no improvement in behaviors with increase gabapentin -7-4: Does appear improved with increase gabapentin, monitor for now -7-5: Gradually improving, does appear somewhat oversedated, likely from polypharmacy for pain control and agitation.  As above, requiring increased pain medications for right-sided pain; change Seroquel from twice daily to nightly, change trazodone from nightly to as needed, change Robaxin to as needed, change topiramate to as needed.  If no further severe agitation, can start to wean off nightly Seroquel and come down on scheduled clonazepam this weekend. -01/13/23 will decrease clonazepam to 0.25mg  BID from the TID dosing.  -01/14/23 less somnolent this morning, much calmer than before, monitor - 7/8: Improving appropriateness, insight; remains fairly impaired; wean sedating medicaitons as tolerated -Seroquel to 50 mg nightly PRN.     6. Skin/Wound Care: Routine skin checks 7. Fluids/Electrolytes/Nutrition/Dysphagia: pt now on regular diet              - 7/13 eating well. No new issues   8.  Acute hypoxic respiratory failure.  Patient extubated 6/10. 9.  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  Maintained on low-dose aspirin  10.  Alcohol intoxication/history of alcohol abuse/tobacco dependence-Per family, was drinking 1 case per day of beer prior to admission.  Provide counseling.  Monitor for any signs of withdrawal.  - 7/1: Wean to Klonopin 0.125 mg BID -7-2: Significantly more agitation today, along with family discussion revealing significant alcohol dependence; Klonopin to 0.25 mg 3 times daily.  Also add nicotine patch to help prevent  cigarette craving.  -7-3: Unchanged, monitor. - 7/8: No ativan since 7/6, reduce clonazepam to 0.125 mg BID - 7/9: Wean scheduled clonazepam over next 2 days. - 7/10: DC clonazepam; has PRN ativan  11.  MRSA pneumonia.  Resolved.  Contact precautions-d/c'd.  Antibiotic therapy completed.  12. Constipation: currently not on any bowel meds -01/07/23 no BM since 01/05/23, if no BM by tomorrow, may need to adjust regimen. Will add some PRNs (miralax, colace, sorbitol) - LBM  7/12   - scheduled colace BID and miralax in AM     LOS: 18 days A FACE TO FACE EVALUATION WAS PERFORMED  Erick Colace 01/22/2023, 3:38 PM

## 2023-01-22 NOTE — Progress Notes (Signed)
Speech Language Pathology Discharge Summary  Patient Details  Name: Shawn Mills MRN: [id number] Date of Birth: 05-02-1981  Date of Discharge from SLP service:January 22, 2023  Today's Date: 01/22/2023 SLP Individual Time: 1445-1530 SLP Individual Time Calculation (min): 45 min   Skilled Therapeutic Interventions:  Pt greeted at bedside. He was awake/alert in his recliner. SLP facilitated a variety of tx tasks targeting cognitive-linguistic skills. He was Aox4 and required only spv cues to utilize his daily schedule to recall today's events. He then completed a structured recall task via 4 word word recall. Initially and after a 10 min delay x2, he was able to recall 4/4 words independently. He benefited from supervision verbal cues (category) to recall 4/4 words after final 10 min delay. He also benefited from minA cues for verbal organization, thought formulation, and specific word finding during verbal abstract reasoning task. Pt was able to identify initial memory deficits after TBI, but continues to require errorless learning to ID current cognitive deficits and their potential negative impact on his return to prev roles/responsibilities. However, pt was responsive to continued education from SLP re TBI and prognosis and verbalized understanding. Pt left in his recliner w/ call light within reach. Recommend cont ST upon d/c.     Patient has met 6 of 7 long term goals.  Patient to discharge at [facility] level.  Reasons goals not met: Suspect baseline pt personality negatively impacted success, though unable to meet awareness goal   Clinical Impression/Discharge Summary:   Overall, pt demonstrated good progress this stay as evidenced by improved short term memory, orientation, attention, problem solving, and verbal expression. However, despite progress, moderate cognitive-linguistic deficits remain overall and continued ST is recommended. Pt/family education completed and he is set to d/c home  with 24/7 support. He would benefit from continued ST services upon d/c to further target cognition and language to acilitate return to previous roles/responsibilities.    Care Partner:  Caregiver Able to Provide Assistance: Yes  Type of Caregiver Assistance: Cognitive  Recommendation:  Outpatient SLP;24 hour supervision/assistance  Rationale for SLP Follow Up: Maximize cognitive function and independence;Reduce caregiver burden   Equipment:   n/a  Reasons for discharge: Discharged from hospital   Patient/Family Agrees with Progress Made and Goals Achieved: Yes    Pati Gallo 01/22/2023, 4:01 PM

## 2023-01-22 NOTE — Plan of Care (Signed)
  Problem: Consults Goal: RH BRAIN INJURY PATIENT EDUCATION Description: Description: See Patient Education module for eduction specifics Outcome: Progressing   Problem: RH BOWEL ELIMINATION Goal: RH STG MANAGE BOWEL WITH ASSISTANCE Description: STG Manage Bowel with toileting Assistance. Outcome: Progressing Goal: RH STG MANAGE BOWEL W/MEDICATION W/ASSISTANCE Description: STG Manage Bowel with Medication with mod I Assistance. Outcome: Progressing   Problem: RH SAFETY Goal: RH STG ADHERE TO SAFETY PRECAUTIONS W/ASSISTANCE/DEVICE Description: STG Adhere to Safety Precautions With cues Assistance/Device. Outcome: Progressing   Problem: RH COGNITION-NURSING Goal: RH STG USES MEMORY AIDS/STRATEGIES W/ASSIST TO PROBLEM SOLVE Description: STG Uses Memory Aids/Strategies With cues /reminders Assistance to Problem Solve. Outcome: Progressing   Problem: RH PAIN MANAGEMENT Goal: RH STG PAIN MANAGED AT OR BELOW PT'S PAIN GOAL Description: < 4 with prns Outcome: Progressing   Problem: RH KNOWLEDGE DEFICIT BRAIN INJURY Goal: RH STG INCREASE KNOWLEDGE OF SELF CARE AFTER BRAIN INJURY Description: Patient and family will be able to manage care at discharge using educational resources independently Outcome: Progressing   Problem: Safety: Goal: Non-violent Restraint(s) Outcome: Progressing   Problem: Education: Goal: Understanding of CV disease, CV risk reduction, and recovery process will improve Outcome: Progressing Goal: Individualized Educational Video(s) Outcome: Progressing   Problem: Activity: Goal: Ability to return to baseline activity level will improve Outcome: Progressing   Problem: Cardiovascular: Goal: Ability to achieve and maintain adequate cardiovascular perfusion will improve Outcome: Progressing Goal: Vascular access site(s) Level 0-1 will be maintained Outcome: Progressing   Problem: Health Behavior/Discharge Planning: Goal: Ability to safely manage  health-related needs after discharge will improve Outcome: Progressing   

## 2023-01-22 NOTE — Progress Notes (Signed)
Occupational Therapy Discharge Summary  Patient Details  Name: Shawn Mills MRN: [id number] Date of Birth: 1981-06-21  Date of Discharge from OT service:March 25, 2023  Today's Date: 01/22/2023 OT Individual Time: 3086-5784 OT Individual Time Calculation (min): 40 min    Patient has met 14 of 14 long term goals due to improved activity tolerance, improved balance, postural control, ability to compensate for deficits, functional use of  RIGHT upper extremity, improved attention, improved awareness, and improved coordination.  Patient to discharge at overall Supervision level.  Patient's care partner is independent to provide the necessary cognitive assistance at discharge. Stephanie has improved significantly in his physical and cognitive independence with ADLs. His behaviors currently present with RLAS VII. He continues to demonstrate reduced awareness into deficits, stating he will return to work in a week. Emphasized deficits in judgement and awareness to his SO Efraim Kaufmann and his mother Gillis Ends.   Recommendation:  Patient will benefit from ongoing skilled OT services in outpatient setting to continue to advance functional skills in the area of BADL and iADL.  Equipment: No equipment provided  Reasons for discharge: treatment goals met and discharge from hospital  Patient/family agrees with progress made and goals achieved: Yes   Skilled OT Intervention: Pt received sitting with 8/10 pain, chronic, in his back, agreeable to OT session. Shower used as pain intervention. Pt requested to finish shaving head and had his own electric razor to do so. He completed the majority standing and required min A to get the back. He was able to maintain crouched down posture with (S). He completed shower at (S) level for safety. He continues to decline following cueing to sit down for LB dressing. Mod I sign posted in room following consult with PT. He dressed with mod I following. He completed 500+ ft of  functional mobility to address functional activity tolerance as well as divided attention. He completed 2x4 stair navigation per his request to see "how it would go"- (S)-  mod I. He returned to his room and was left sitting in the recliner, all needs met.     OT Discharge Precautions/Restrictions  Precautions Precautions: Fall Restrictions Weight Bearing Restrictions: No  ADL ADL Eating: Modified independent Where Assessed-Eating: Chair Grooming: Modified independent Where Assessed-Grooming: Sitting at sink Upper Body Bathing: Supervision/safety Where Assessed-Upper Body Bathing: Shower Lower Body Bathing: Supervision/safety Where Assessed-Lower Body Bathing: Shower Upper Body Dressing: Supervision/safety Lower Body Dressing: Supervision/safety Where Assessed-Lower Body Dressing: Sitting at sink Toileting: Supervision/safety Where Assessed-Toileting: Teacher, adult education: Distant supervision Statistician Method: Ambulating Tub/Shower Transfer: Distant supervision Tub/Shower Transfer Method: Event organiser: Close supervision Film/video editor Method: Ambulating Vision Baseline Vision/History: 0 No visual deficits Patient Visual Report: No change from baseline Vision Assessment?: No apparent visual deficits Perception  Perception: Within Functional Limits Praxis Praxis: Intact Cognition Cognition Overall Cognitive Status: Impaired/Different from baseline Arousal/Alertness: Awake/alert Orientation Level: Person;Place;Situation Person: Oriented Place: Oriented Situation: Oriented Memory: Impaired Memory Impairment: Retrieval deficit;Decreased recall of new information Attention: Selective Selective Attention: Impaired Selective Attention Impairment: Verbal complex;Functional complex Awareness: Impaired Awareness Impairment: Anticipatory impairment Problem Solving: Impaired Problem Solving Impairment: Verbal complex;Functional  complex Behaviors: Poor frustration tolerance Safety/Judgment: Impaired Comments: Decreased awareness and judgement Ranch[place] Mirant Scales of Cognitive Functioning: Automatic, Appropriate Brief Interview for Mental Status (BIMS) Repetition of Three Words (First Attempt): 3 Temporal Orientation: Year: Correct Temporal Orientation: Month: Accurate within 5 days Temporal Orientation: Day: Correct Recall: "Sock": Yes, no cue required Recall: "Blue": Yes, no cue required Recall: "Bed":  No, could not recall BIMS Summary Score: 13 Sensation Sensation Light Touch: Appears Intact Hot/Cold: Appears Intact Proprioception: Appears Intact Stereognosis: Appears Intact Additional Comments: Chronic pain down R leg, ?nerve Coordination Gross Motor Movements are Fluid and Coordinated: No Fine Motor Movements are Fluid and Coordinated: Yes Coordination and Movement Description: Antalgic gait 2/2 knee and hip pain, chronic Motor  Motor Motor: Within Functional Limits Mobility  Bed Mobility Supine to Sit: Independent Sit to Supine: Independent Transfers Sit to Stand: Independent with assistive device Stand to Sit: Independent with assistive device  Trunk/Postural Assessment  Cervical Assessment Cervical Assessment: Within Functional Limits Thoracic Assessment Thoracic Assessment: Within Functional Limits Lumbar Assessment Lumbar Assessment: Within Functional Limits Postural Control Postural Control: Deficits on evaluation Righting Reactions: delayed but greatly improved  Balance Balance Balance Assessed: Yes Static Sitting Balance Static Sitting - Balance Support: Feet supported Static Sitting - Level of Assistance: 7: Independent Dynamic Sitting Balance Dynamic Sitting - Level of Assistance: 7: Independent Static Standing Balance Static Standing - Level of Assistance: 7: Independent Dynamic Standing Balance Dynamic Standing - Level of Assistance: 6: Modified independent  (Device/Increase time) Extremity/Trunk Assessment RUE Assessment RUE Assessment: Within Functional Limits LUE Assessment LUE Assessment: Within Functional Limits   Crissie Reese 01/22/2023, 8:15 AM

## 2023-01-22 NOTE — Telephone Encounter (Addendum)
Pharmacy Patient Advocate Encounter  Received notification from [facility] that Prior Authorization for oxyCODONE HCl 5MG  tablets has been APPROVED from 01/22/2023 to 07/21/2023.Marland Kitchen  PA #/Case ID/Reference #: [id number]

## 2023-01-22 NOTE — Progress Notes (Signed)
Physical Therapy Discharge Summary  Patient Details  Name: Shawn Mills MRN: [id number] Date of Birth: 11-19-1980  Date of Discharge from PT service:January 22, 2023  Today's Date: 01/22/2023 PT Individual Time: 0932-1030 PT Individual Time Calculation (min): 58 min    Patient has met 9 of 9 long term goals due to improved activity tolerance, improved balance, improved postural control, increased strength, improved attention, and improved awareness.  Patient to discharge at an ambulatory level Supervision.   Patient's care partner is independent to provide the necessary physical and cognitive assistance at discharge.  Reasons goals not met: NA  Recommendation:  Patient will benefit from ongoing skilled PT services in outpatient setting to continue to advance safe functional mobility, address ongoing impairments in balance, safety awareness, ambulation, endurance, and minimize fall risk.  Equipment: No equipment provided  Reasons for discharge: treatment goals met and discharge from hospital  Patient/family agrees with progress made and goals achieved: Yes  Skilled therapeutic Interventions: Pt received seated in recliner and agrees to therapy. Reports pain in "butt crack going down right leg". PT provides rest breaks, repositioning, and mobility to manage pain. Pt performs sit to stand independently. Pt ambulates x300' without AD, with cues for upright gaze to improve posture and balance. Pt completes x12 6" steps with Right hand rail and cues for step sequencing for comfort and safety. Following rest break, pt completes Functional Gait Assessment, as detailed below, indicating pt has improved more than MCID. Pt the participates in Wii bowling game to challenge attention to task, fine and gross motor skills, and dynamic standing balance. Pt then completes Nustep task for endurance training. Pt completes x10:00 at workload of 7 with average steps per minute ~60. PT provides cues for hand and  foot placement and completing full available ROM. Pt ambulates back to room. Left seated in recliner with alarm intact and all needs within reach.   PT Discharge Precautions/Restrictions Precautions Precautions: Fall Restrictions Weight Bearing Restrictions: No Pain Interference Pain Interference Pain Effect on Sleep: 3. Frequently Pain Interference with Therapy Activities: 3. Frequently Pain Interference with Day-to-Day Activities: 1. Rarely or not at all Vision/Perception  Vision - History Ability to See in Adequate Light: 0 Adequate Perception Perception: Within Functional Limits Praxis Praxis: Intact  Cognition Overall Cognitive Status: Impaired/Different from baseline Arousal/Alertness: Awake/alert Attention: Selective Selective Attention: Impaired Selective Attention Impairment: Verbal complex;Functional complex Memory: Impaired Memory Impairment: Retrieval deficit;Decreased recall of new information Awareness: Impaired Awareness Impairment: Anticipatory impairment Problem Solving: Impaired Problem Solving Impairment: Verbal complex;Functional complex Behaviors: Poor frustration tolerance Safety/Judgment: Impaired Comments: Decreased awareness and judgement Rancho [place]ant Scales of Cognitive Functioning: Automatic, Appropriate Sensation Sensation Light Touch: Appears Intact Hot/Cold: Appears Intact Proprioception: Appears Intact Stereognosis: Appears Intact Additional Comments: Chronic pain down R leg, ?nerve Coordination Gross Motor Movements are Fluid and Coordinated: No Fine Motor Movements are Fluid and Coordinated: Yes Coordination and Movement Description: Antalgic gait 2/2 knee and hip pain, chronic Motor  Motor Motor: Within Functional Limits  Mobility Bed Mobility Supine to Sit: Independent Sit to Supine: Independent Transfers Transfers: Sit to Stand;Stand to Sit;Stand Pivot Transfers Sit to Stand: Independent with assistive device Stand to  Sit: Independent with assistive device Stand Pivot Transfers: Independent with assistive device Transfer (Assistive device): None Locomotion  Gait Ambulation: Yes Gait Assistance: Supervision/Verbal cueing Gait Distance (Feet): 300 Feet Assistive device: None Gait Assistance Details: Verbal cues for gait pattern;Verbal cues for technique Gait Gait: Yes Gait Pattern: Impaired Gait Pattern:  (wide base of support and  antalgic gait pattern) Stairs / Additional Locomotion Stairs: Yes Stairs Assistance: Supervision/Verbal cueing Stair Management Technique: One rail Right Number of Stairs: 12 Height of Stairs: 6 Ramp: Supervision/Verbal cueing Curb: Supervision/Verbal cueing  Trunk/Postural Assessment  Cervical Assessment Cervical Assessment: Within Functional Limits Thoracic Assessment Thoracic Assessment: Within Functional Limits Lumbar Assessment Lumbar Assessment: Within Functional Limits Postural Control Postural Control: Deficits on evaluation Righting Reactions: delayed but greatly improved  Balance Balance Balance Assessed: Yes Standardized Balance Assessment Standardized Balance Assessment: Functional Gait Assessment Static Sitting Balance Static Sitting - Balance Support: Feet supported Static Sitting - Level of Assistance: 7: Independent Dynamic Sitting Balance Dynamic Sitting - Balance Support: Feet supported Dynamic Sitting - Level of Assistance: 7: Independent Static Standing Balance Static Standing - Balance Support: During functional activity Static Standing - Level of Assistance: 7: Independent Dynamic Standing Balance Dynamic Standing - Balance Support: During functional activity Dynamic Standing - Level of Assistance: 6: Modified independent (Device/Increase time) Functional Gait  Assessment Gait assessed : Yes Gait Level Surface: Walks 20 ft in less than 7 sec but greater than 5.5 sec, uses assistive device, slower speed, mild gait deviations, or  deviates 6-10 in outside of the 12 in walkway width. Change in Gait Speed: Able to change speed, demonstrates mild gait deviations, deviates 6-10 in outside of the 12 in walkway width, or no gait deviations, unable to achieve a major change in velocity, or uses a change in velocity, or uses an assistive device. Gait with Horizontal Head Turns: Performs head turns smoothly with no change in gait. Deviates no more than 6 in outside 12 in walkway width Gait with Vertical Head Turns: Performs head turns with no change in gait. Deviates no more than 6 in outside 12 in walkway width. Gait and Pivot Turn: Pivot turns safely within 3 sec and stops quickly with no loss of balance. Step Over Obstacle: Is able to step over one shoe box (4.5 in total height) without changing gait speed. No evidence of imbalance. Gait with Narrow Base of Support: Is able to ambulate for 10 steps heel to toe with no staggering. Gait with Eyes Closed: Walks 20 ft, uses assistive device, slower speed, mild gait deviations, deviates 6-10 in outside 12 in walkway width. Ambulates 20 ft in less than 9 sec but greater than 7 sec. Ambulating Backwards: Walks 20 ft, no assistive devices, good speed, no evidence for imbalance, normal gait Steps: Alternating feet, must use rail. Total Score: 25 Extremity Assessment  RUE Assessment RUE Assessment: Within Functional Limits LUE Assessment LUE Assessment: Within Functional Limits RLE Assessment General Strength Comments: Grossly 4+/5 LLE Assessment LLE Assessment: Within Functional Limits   Beau Fanny, PT, DPT 01/22/2023, 4:57 PM

## 2023-01-22 NOTE — Telephone Encounter (Addendum)
Pharmacy Patient Advocate Encounter  Received notification from [facility] that Prior Authorization for Lidocaine 5% patches has been APPROVED from 01/22/2023 to 04/22/2023.Marland Kitchen  PA #/Case ID/Reference #: [id number] N Key: [id number]

## 2023-01-22 NOTE — Progress Notes (Signed)
Occupational Therapy TBI Note  Patient Details  Name: Shawn Mills MRN: [id number] Date of Birth: 04-May-1981  Today's Date: 01/22/2023 OT Individual Time: 4332-9518 OT Individual Time Calculation (min): 58 min    Short Term Goals: Week 3:  OT Short Term Goal 1 (Week 3): STG= LTG d/t ELOS  Skilled Therapeutic Interventions/Progress Updates:    Pt received supine with no c/o pain, agreeable to OT session. He was groggy initially but joking with OT and OTS appropriately. He completed toileting tasks with mod I overall. He sat in the recliner and ate breakfast, appropriately dividing attention. Pt completed functional mobility to the therapy gym with (S)-mod I overall. He sat EOM and completed 3x10 lat pull downs, B rows, and scapular retractions for posterior chain strengthening for back pain relief, using a level 3 red theraband with OT grading resistance to provide challenge. TBI education provided throughout session. Discussed discharge planning and completed final d/c cognitive testing. He returned to his room following. Pt was left sitting up in the recliner with all needs met, chair alarm set, and call bell within reach.    Therapy Documentation Precautions:  Precautions Precautions: Fall Precaution Comments: enclosure bed Restrictions Weight Bearing Restrictions: No    Agitated Behavior Scale: TBI Observation Details Observation Environment: CIR Start of observation period - Date: 01/22/23 Start of observation period - Time: 0730 End of observation period - Date: 01/22/23 End of observation period - Time: 0830 Agitated Behavior Scale (DO NOT LEAVE BLANKS) Short attention span, easy distractibility, inability to concentrate: Present to a slight degree Impulsive, impatient, low tolerance for pain or frustration: Present to a slight degree Uncooperative, resistant to care, demanding: Absent Violent and/or threatening violence toward people or property: Absent Explosive and/or  unpredictable anger: Absent Rocking, rubbing, moaning, or other self-stimulating behavior: Absent Pulling at tubes, restraints, etc.: Absent Wandering from treatment areas: Absent Restlessness, pacing, excessive movement: Absent Repetitive behaviors, motor, and/or verbal: Absent Rapid, loud, or excessive talking: Absent Sudden changes of mood: Absent Easily initiated or excessive crying and/or laughter: Absent Self-abusiveness, physical and/or verbal: Absent Agitated behavior scale total score: 16    Therapy/Group: Individual Therapy  Crissie Reese 01/22/2023, 8:16 AM

## 2023-01-23 ENCOUNTER — Other Ambulatory Visit (HOSPITAL_COMMUNITY): Payer: Self-pay

## 2023-01-23 DIAGNOSIS — S069X9S Unspecified intracranial injury with loss of consciousness of unspecified duration, sequela: Secondary | ICD-10-CM

## 2023-01-23 DIAGNOSIS — R41844 Frontal lobe and executive function deficit: Secondary | ICD-10-CM

## 2023-01-23 DIAGNOSIS — S065XAS Traumatic subdural hemorrhage with loss of consciousness status unknown, sequela: Secondary | ICD-10-CM

## 2023-01-23 MED ORDER — GABAPENTIN 300 MG PO CAPS
300.0000 mg | ORAL_CAPSULE | Freq: Three times a day (TID) | ORAL | 0 refills | Status: DC
  Filled 2023-01-23: qty 90, 30d supply, fill #0

## 2023-01-23 NOTE — Progress Notes (Signed)
Patient ID: Shawn Mills, male   DOB: 16-May-1981, 42 y.o.   MRN: [id number]  SW spoke with s/o Melissa to discuss outpatient preference. SW will send PT/OT/SLP referral to [facility].  SW faxed referral to [facility] (p:[phone]/f:[phone]).  Cecile Sheerer, MSW, LCSWA Office: [phone] Cell: [phone] Fax: [phone]

## 2023-01-23 NOTE — Progress Notes (Signed)
Inpatient Rehabilitation Care Coordinator Discharge Note   Patient Details  Name: Shawn Mills MRN: [id number] Date of Birth: Dec 22, 1980   Discharge location: D/c to home with s/o  Length of Stay: 18 days  Discharge activity level: Supervision  Home/community participation: Limited  Patient response OZ:HYQMVH Literacy - How often do you need to have someone help you when you read instructions, pamphlets, or other written material from your doctor or pharmacy?: Never  Patient response QI:ONGEXB Isolation - How often do you feel lonely or isolated from those around you?: Never  Services provided included: RD, MD, PT, OT, SLP, RN, TR, Pharmacy, Neuropsych, SW, CM  Financial Services:  Field seismologist Utilized: Physiological scientist [facility]  Choices offered to/list presented to: patient s/o  Follow-up services arranged:  Outpatient    Outpatient Servicies: [facility] for PT/OT/SLP      Patient response to transportation need: Is the patient able to respond to transportation needs?: Yes In the past 12 months, has lack of transportation kept you from medical appointments or from getting medications?: No In the past 12 months, has lack of transportation kept you from meetings, work, or from getting things needed for daily living?: No   Patient/Family verbalized understanding of follow-up arrangements:  Yes  Individual responsible for coordination of the follow-up plan: contact s/o Shawn Mills [phone]  Confirmed correct DME delivered: Gretchen Short 01/23/2023    Comments (or additional information):fam edu completed.   Summary of Stay    Date/Time Discharge Planning CSW  01/16/23 1126 Plan remains to discharge to home with his fiance and support from her dtr, and his mother. Fam edu oending Wed (7/10) with fiance and mother. SW will confirm there are no barriers to discharge. AAC  01/09/23 1115 Pt will discharge to home ewith his fiance and  support from her dtr, and his mother. SW will confirm there are no barriers to discharge. AAC       [name] A Lula Olszewski

## 2023-01-23 NOTE — Progress Notes (Signed)
PROGRESS NOTE   Subjective/Complaints:  No events ovenright., used PRN oxycodone yesterday AM. Continues with RLE pain, now saying it comes from puttochs and wraps to medial thugh and then entire RLE beyond the knee. He is planning on asking a friend/surgeon to look through his medical record Friday to consider surgery.   Also plans to return to work next week, discussed avoidance of lifting/moving/balance/high places with fall risk (ex. Roofs) and absolute abstinence from alcohol, which he is agreeable to.   LBM 7/15; continent Vitals stable  ROS: Denies fevers, chills, N/V, abdominal pain, constipation, diarrhea, SOB, cough, chest pain, new weakness or paraesthesias.   + RLE pain    Objective:   No results found. Recent Labs    01/22/23 0641  WBC 6.0  HGB 13.3  HCT 39.0  PLT 418*   Recent Labs    01/22/23 0641  NA 138  K 3.7  CL 104  CO2 26  GLUCOSE 88  BUN 6  CREATININE 0.75  CALCIUM 8.9    Intake/Output Summary (Last 24 hours) at 01/23/2023 0909 Last data filed at 01/23/2023 0841 Gross per 24 hour  Intake 1560 ml  Output --  Net 1560 ml        Physical Exam: Vital Signs Blood pressure 118/76, pulse 89, temperature 98 F (36.7 C), resp. rate 18, height 5\' 10"  (1.778 m), weight 92.1 kg, SpO2 96%.  PE: Constitution: Appropriate appearance for age. No apparent distress. Sitting up in room.  Resp: No respiratory distress. No accessory muscle usage. on RA and CTAB Cardio: Well perfused appearance. No peripheral edema. Abdomen: Nondistended. Nontender.   Psych: Appropriate mood and affect. Neuro: AAOx4. No apparent cognitive deficits   Neurologic Exam:   DTRs: Reflexes were 2+ in bilateral achilles, patella, biceps, BR and triceps. Babinsky: flexor responses b/l.   Hoffmans: negative b/l Sensory exam: revealed normal sensation in all dermatomal regions in bilateral upper extremities, bilateral lower  extremities, and with reduced sensation to light touch in right MEDIAL knee and calf -  was LATERAL knee prior Motor exam: strength 5/5 throughout bilateral upper extremities and bilateral lower extremities Coordination: Fine motor coordination was normal.   Gait: normal       Assessment/Plan: 1. Functional deficits which require 3+ hours per day of interdisciplinary therapy in a comprehensive inpatient rehab setting. Physiatrist is providing close team supervision and 24 hour management of active medical problems listed below. Physiatrist and rehab team continue to assess barriers to discharge/monitor patient progress toward functional and medical goals  Care Tool:  Bathing    Body parts bathed by patient: Right arm, Left arm, Chest, Abdomen, Front perineal area, Buttocks, Right upper leg, Left upper leg, Face, Left lower leg, Right lower leg         Bathing assist Assist Level: Supervision/Verbal cueing     Upper Body Dressing/Undressing Upper body dressing   What is the patient wearing?: Pull over shirt    Upper body assist Assist Level: Independent    Lower Body Dressing/Undressing Lower body dressing      What is the patient wearing?: Pants     Lower body assist Assist for lower body dressing: Supervision/Verbal  cueing     Toileting Toileting    Toileting assist Assist for toileting: Supervision/Verbal cueing     Transfers Chair/bed transfer  Transfers assist     Chair/bed transfer assist level: Independent     Locomotion Ambulation   Ambulation assist      Assist level: Supervision/Verbal cueing Assistive device: No Device Max distance: >500'   Walk 10 feet activity   Assist     Assist level: Supervision/Verbal cueing Assistive device: No Device   Walk 50 feet activity   Assist    Assist level: Supervision/Verbal cueing Assistive device: No Device    Walk 150 feet activity   Assist    Assist level: Supervision/Verbal  cueing Assistive device: No Device    Walk 10 feet on uneven surface  activity   Assist     Assist level: Supervision/Verbal cueing     Wheelchair     Assist Is the patient using a wheelchair?: No             Wheelchair 50 feet with 2 turns activity    Assist            Wheelchair 150 feet activity     Assist          Blood pressure 118/76, pulse 89, temperature 98 F (36.7 C), resp. rate 18, height 5\' 10"  (1.778 m), weight 92.1 kg, SpO2 96%.   Medical Problem List and Plan:  1. 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 [place] but improving orientation. Does well with non-dynamic movements; lost balance in the shower this week. Can't tolerate frustration or attention for complex tasks. Perseverates on wanting to leave. Per SLP can redirect somewhat, working on external aides. - 7/14 sleeping better. RLAS VII for DC   2.  Antithrombotics: -DVT/anticoagulation:  Pharmaceutical: Lovenox 40mg  BID             -antiplatelet therapy: Aspirin 81 mg daily  3. Pain Management: Oxycodone as needed,   - Robaxin 1000 mg 3 times daily as needed added for right calf pain, spasm -01/06/23 reported HA to nursing, CT head neg yesterday, will start topamax 25mg  every day PRN for now -01/07/23 no complaints today, cont regimen -7-1: Scheduled Topamax due to recurrent headaches, improved - 7/2: Start gabapentin 300 mg 3 times daily for chronic alcohol use, psychomotor agitation, and recurrent headaches; if does well, may consider changing Topamax to as needed -7/3: Increase gabapentin to 600 mg 3 times daily.  Has increased use of Robaxin and  as needed oxycodone today. -7-4: Scheduled Tylenol 1000 mg 3 times daily, as well as Robaxin 1000 mg 3 times daily for ongoing right calf pain.  On chart review, appears to be chronic at least since 2022, MRI back at that time did not show significant nerve impingement or spinal stenosis.  Could consider duplex of right calf, however with ongoing agitation do not feel patient would tolerate and low index of suspicion for DVT, on prophylaxis, continue monitoring. -7/5: X-ray right knee shows no apparent fracture or arthritis; read pending.  Suspect  more right hemisensory loss and paresthesias secondary to TBI.  Per patient request, we will schedule oxycodone 5 mg 3 times daily.  Increase gabapentin to 800 mg 3 times daily, and add Elavil 25 mg nightly for neuropathy.  Change Robaxin to as needed.  If pain improves over the weekend, would switch oxycodone back to as needed.  If further pain control needed, could uptitrate Elavil versus add Lyrica. -01/13/23 pain seems controlled, but somnolent this morning; will monitor for now, may be able to go back to PRN oxycodone dosing tomorrow -01/14/23 R knee/hamstring pain and tightness, will order Kpad for now given multiple recent med adjustments-- leave scheduled oxy for now. Advised trying robaxin. Might need to consider stretches to see if hamstring tightness is part of it? If he will cooperate.  - 7/9: Improved reports of pain with scheduled baclofen, areas of greatest pain over right paraspinals, PSIS, and SI joints.  Lidocaine patches daily to right back, and increase baclofen to 15 mg 3 times daily - 7/10: Family endorses R knee getting caught with fall; ?MCL / medial meniscus pathology based on exam-- -7/13-14  . Main complaint is back with likely radiculopathy (acute on chronic sx). MRI from 12/22 performed with hx of numbness in the right leg--doesn't demontrate any foraminal stenosis on right  - lyrica 25mg  tid to 50mg  tid--leave as is today  -continue  gabapentin at 400mg  tid may reduce to 300mg  since starting increased dose Lyrica  -reduced amitriptyline to 25mg  at bedtime  -needs kpad for back--he's on wait list  -looked at pelvic CT and no sign of pelvic/hip trauma or DJD either  -EMG/NCS as outpt?  4. Mood/Behavior/Sleep: Klonopin 0.25 mg twice daily, Ritalin 10 mg twice daily, Inderal 20 mg 3 times daily             -antipsychotic agents: Seroquel 100 mg every morning and 200 mg nightly              - Sleep log 6/28              - veil bed for impulsivity, DC IV on admission d/t limiting lines/drains -01/06/23 agitated at times, wants to leave, but remains in bed and didn't become aggressive; keep enclosure bed in place for now; has ativan 1mg  q6h PRN for agitation -01/07/23 less agitated but found in hallway unattended, advised nursing staff that he needs to be monitored closer, don't think he's ready to be released from vail bed yet.  -7-1: Continue veil bed.   -7-2: Add TeleSitter when available to allow patient with bed unzipped, in chair as able to prevent agitation.  See note 7-2 for details of discussion with family, will discontinue Ritalin today due to history of causing violent agitation and transition from Seroquel 100 mg every morning/200 mg every afternoon to 100 mg twice daily, add trazodone 100 mg nightly to promote sleep.  Will wean antipsychotics as able, as I do not feel they are helping at this time and patient's agitation is likely considerably attributable to chronic alcohol use. continue sleep log. -7-3: Sleep unchanged with transition from Seroquel 200 mg to 100 mg plus trazodone 100 mg nightly.  Increasing gabapentin as above, may consider resuming 200 mg Seroquel nightly if no improvement.  Continuing to use intermittent Ativan for agitation. -7/4: Per nursing report, slept throughout the night last night.  Much improved!  Continue current management and wean antipsychotics as able, then benzodiazepines. -7-5: Sleep and  behaviors much improved, some mildly improving  Insight but still remains fairly impaired.  Somewhat lethargic and dysarthric today, concern for oversedation with increasing pain regimen as above.  Switch Seroquel to nightly only, trazodone to as needed, and add Elavil nightly as above.  Monitor closely with sleep log -01/13/23 slept poorly per pt, but with recent changes, will monitor overnight tonight.  - 7/10: Change trazadone 100 mg at bedtime to scheduled, seroquel to PRN   -7/13-14 sleeping well now  5. Neuropsych/cognition: This patient is not capable of making decisions on his own behalf.              - Rec neuropsych consult  - Patient easily redirectable for now, if attempts AMA he does not have capacity for decision-making.  Has Ativan as needed for anxiety, agitation (see above) -7-1: Reduce standing Clonazepam to 0.125 mg BID; add standing Topomax 25 mg BID for mood and headaches; reduce Ritalin to 5 mg BID - 7/2: Getting PRN ativan nightly @ 2100; sleep log getting 7 hours awake at 2 am.  See above; discontinue Ritalin, down titrate Seroquel, add trazodone nightly, add gabapentin 300 mg 3 times daily, and increase Klonopin to 0.25 mg 3 times daily to target agitation due to discontinuation of chronic alcohol use; continue as needed Ativan and propranolol. -7-3: Increase in gabapentin as above; may transition from Topamax to Depakote in a.m. if no improvement in behaviors with increase gabapentin -7-4: Does appear improved with increase gabapentin, monitor for now -7-5: Gradually improving, does appear somewhat oversedated, likely from polypharmacy for pain control and agitation.  As above, requiring increased pain medications for right-sided pain; change Seroquel from twice daily to nightly, change trazodone from nightly to as needed, change Robaxin to as needed, change topiramate to as needed.  If no further severe agitation, can start to wean off nightly Seroquel and come down on scheduled  clonazepam this weekend. -01/13/23 will decrease clonazepam to 0.25mg  BID from the TID dosing.  -01/14/23 less somnolent this morning, much calmer than before, monitor - 7/8: Improving appropriateness, insight; remains fairly impaired; wean sedating medicaitons as tolerated -Seroquel to 50 mg nightly PRN.     6. Skin/Wound Care: Routine skin checks 7. Fluids/Electrolytes/Nutrition/Dysphagia: pt now on regular diet              - 7/13 eating well. No new issues   8.  Acute hypoxic respiratory failure.  Patient extubated 6/10. 9.  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  Maintained on low-dose aspirin  10.  Alcohol intoxication/history of alcohol abuse/tobacco dependence-Per family, was drinking 1 case per day of beer prior to admission.  Provide counseling.  Monitor for any signs of withdrawal.  - 7/1: Wean to Klonopin 0.125 mg BID -7-2: Significantly more agitation today, along with family discussion revealing significant alcohol dependence; Klonopin to 0.25 mg 3 times daily.  Also add nicotine patch to help prevent cigarette craving.  -7-3: Unchanged, monitor. - 7/8: No ativan since 7/6, reduce clonazepam to 0.125 mg BID - 7/9: Wean scheduled clonazepam over next 2 days. - 7/10: DC clonazepam; has PRN ativan - not needing - 7/16: Emphasized need for absolute abstinence form alcohol with current TBI and medications due to risk of intoxication, injury, and death; patient agrees with abstaining from substances  11.  MRSA pneumonia.  Resolved.  Contact precautions-d/c'd.  Antibiotic therapy completed.  12. Constipation: currently not on any bowel meds -01/07/23 no BM since 01/05/23, if no BM by tomorrow, may need to adjust regimen. Will add some PRNs (miralax, colace, sorbitol) -  LBM  7/12   - scheduled colace BID and miralax in AM     LOS: 19 days A FACE TO FACE EVALUATION WAS PERFORMED  Angelina Sheriff 01/23/2023, 9:09 AM

## 2023-01-24 ENCOUNTER — Telehealth: Payer: Self-pay | Admitting: *Deleted

## 2023-01-24 NOTE — Telephone Encounter (Addendum)
Transitional Care call--who you talked with    Are you/is patient experiencing any problems since coming home? No Are there any questions regarding any aspect of care? No Are there any questions regarding medications administration/dosing? No Are meds being taken as prescribed?Yes  Have there been any falls? No Has Home Health been to the house and/or have they contacted you? Yes If not, have you tried to contact them? S/O is off tomorrow and will get appts scheduled. Are bowels and bladder emptying properly? Yes Are there any unexpected incontinence issues? No If applicable, is patient following bowel/bladder programs? N/a Any fevers, problems with breathing, unexpected pain? No Are there any skin problems or new areas of breakdown? No Has the patient/family member arranged specialty MD follow up (ie cardiology/neurology/renal/surgical/etc)?  S/O will schedule Neuro Rehab 02/13/23 Does the patient need any other services or support that we can help arrange? No  Are caregivers following through as expected in assisting the patient? Yes S/O and her daughter and pts father Has the patient quit smoking, drinking alcohol, or using drugs as recommended? Patient is still smoking but has not resumed alcohol.  Appointment time, arrive time and who it is with here 10:40 arrival 10:20 Jacalyn Lefevre [address]  Transitional Care call--Melissa S/O

## 2023-01-31 ENCOUNTER — Encounter: Payer: 59 | Attending: Registered Nurse | Admitting: Registered Nurse

## 2023-01-31 ENCOUNTER — Encounter: Payer: Self-pay | Admitting: Registered Nurse

## 2023-01-31 ENCOUNTER — Ambulatory Visit
Admission: RE | Admit: 2023-01-31 | Discharge: 2023-01-31 | Disposition: A | Discharge status: Home / Self Care | Payer: 59 | Source: Ambulatory Visit | Attending: Registered Nurse | Admitting: Registered Nurse

## 2023-01-31 VITALS — BP 132/82 | HR 81 | Ht 70.0 in | Wt 208.0 lb

## 2023-01-31 DIAGNOSIS — S069X1D Unspecified intracranial injury with loss of consciousness of 30 minutes or less, subsequent encounter: Secondary | ICD-10-CM

## 2023-01-31 DIAGNOSIS — Z5181 Encounter for therapeutic drug level monitoring: Secondary | ICD-10-CM

## 2023-01-31 DIAGNOSIS — M5416 Radiculopathy, lumbar region: Secondary | ICD-10-CM | POA: Diagnosis not present

## 2023-01-31 DIAGNOSIS — Z79891 Long term (current) use of opiate analgesic: Secondary | ICD-10-CM | POA: Diagnosis not present

## 2023-01-31 DIAGNOSIS — G894 Chronic pain syndrome: Secondary | ICD-10-CM | POA: Diagnosis not present

## 2023-01-31 DIAGNOSIS — M549 Dorsalgia, unspecified: Secondary | ICD-10-CM | POA: Diagnosis not present

## 2023-01-31 MED ORDER — OXYCODONE HCL 5 MG PO TABS: 5.0000 mg | ORAL_TABLET | Freq: Four times a day (QID) | ORAL | 0 refills | Status: DC | PRN

## 2023-01-31 NOTE — Progress Notes (Signed)
Subjective:    Patient ID: Shawn Mills, male    DOB: 1981-03-21, 42 y.o.   MRN: [id number]  HPI: Shawn Mills is a 42 y.o. male who is schedueld for Transitional care visit for follow up of his  TBI and Right Lumbar Radiculitis. He presented to [facility] on 12/08/2022 via EMS after falling backwards on his front porch striking the back of his head.   Dr. Andrey Campanile H&P: on 12/08/2022 HPI: Shawn Mills is an 42 y.o. male who is here for evaluation as a level 1 trauma alert.  History is obtained entirely from EMS.  Patient was reportedly drinking on his back porch when he fell backwards and struck his head.  On arrival EMS noted the patient was bleeding from his right ear.  He was unresponsive.  He was breathing spontaneously.  He did not follow commands.  He had spontaneous movements more on his left side.  He was brought directly from the scene.  Otherwise no additional history is obtainable.  CT Head WO Contrast: CT Cervical Spine  IMPRESSION: 1. Subdural and subarachnoid hemorrhage along the left frontal lobe and temporal lobe, without significant mass effect or midline shift. 2. Nondisplaced right parietal fracture, which extends inferiorly to the right lambdoid suture, with diastasis of the right lambdoid suture. This extends into the right occipitomastoid suture and likely extends to the right temporal bone, with air noted in the right jugular foramen and carotid canal. 3. Fractures extending through the bilateral sphenoid sinuses and into the bilateral carotid canals. Fracture also extends superiorly through the clivus into the posterior fossa and through the floor of the sphenoid sinuses into the nasopharynx. 4. Vertically oriented fracture through the right mastoid, which extends into the middle ear, adjacent to the ossicles, with likely hemorrhage around the ossicles and in the middle ear. This likely also extends into the middle cranial fossa. 5. Air is noted in  the parapharyngeal fat bilaterally. 6. No acute fracture or static subluxation in the cervical spine. 7. For findings in the thorax, please see same day CT chest.   CT Angio: Head and Neck:  IMPRESSION: 1. Irregularity and linear filling defects in the left distal petrous segment and possibly in the bilateral cavernous segments, concerning for dissection. These locations are adjacent to fractures noted on the prior CT head. 2. Nonopacification of the right sigmoid sinus, right jugular bulb, and proximal right internal jugular vein, with a filling defect in the proximal to mid right IJ, concerning for thrombus. 3. No hemodynamically significant stenosis in the neck. 4. Please see same-day CT head and cervical spine for osseous findings.  Neurosurgery following, he was cleared for low- dose aspirin.   [name] was admitted to inpatient rehabilitation on 01/04/2023 and discharged home on 01/23/2023. He is scheduled for outpatient therapy with Neuro- Rehabilitation. He states he has lower back pain radiating into his right lower extremity. He rates his pain 10. Also reports he has a good appetite.   [name] Morphine equivalent is 30. .   Oral Swab was Performed today.    Pain Inventory Average Pain 10 Pain Right Now 10 My pain is sharp and aching  LOCATION OF PAIN  head buttocks groin hip thigh knee leg  BOWEL Number of stools per week: 7  BLADDER Normal  Mobility walk without assistance how many minutes can you walk? 5 ability to climb steps?  yes do you drive?  no Do you have any goals in this area?  yes  Function not employed: date last employed 11/07/2021 I need assistance with the following:  household duties and shopping  Neuro/Psych weakness tremor trouble walking spasms confusion depression  Prior Studies Any changes since last visit?  yes  pain medication is not strong enough  Physicians involved in your care Any changes since last visit?   no   No family history on file. Social History   Socioeconomic History   Marital status: Unknown    Spouse name: Not on file   Number of children: Not on file   Years of education: Not on file   Highest education level: Not on file  Occupational History   Not on file  Tobacco Use   Smoking status: Former    Types: Cigarettes   Smokeless tobacco: Not on file  Substance and Sexual Activity   Alcohol use: Yes    Comment: daiily   Drug use: No   Sexual activity: Yes    Birth control/protection: None  Other Topics Concern   Not on file  Social History Narrative   ** Merged History Encounter **       Social Determinants of Health   Financial Resource Strain: Not on file  Food Insecurity: Not on file  Transportation Needs: Not on file  Physical Activity: Not on file  Stress: Not on file  Social Connections: Not on file   Past Surgical History:  Procedure Laterality Date   IR ANGIO INTRA EXTRACRAN SEL COM CAROTID INNOMINATE BILAT MOD SED  12/09/2022   IR ANGIO VERTEBRAL SEL VERTEBRAL BILAT MOD SED  12/09/2022   LITHOTRIPSY     Past Medical History:  Diagnosis Date   Carpal tunnel syndrome    Kidney stones    BP 132/82   Pulse 81   Ht 5\' 10"  (1.778 m)   Wt 208 lb (94.3 kg)   SpO2 99%   BMI 29.84 kg/m   Opioid Risk Score:   Fall Risk Score:  `1  Depression screen [place] 2/9     01/31/2023    9:59 AM  Depression screen [facility] 2/9  Decreased Interest 0  Down, Depressed, Hopeless 0  PHQ - 2 Score 0  Altered sleeping 1  Tired, decreased energy 2  Change in appetite 0  Feeling bad or failure about yourself  2  Trouble concentrating 0  Moving slowly or fidgety/restless 1  Suicidal thoughts 0  PHQ-9 Score 6     Review of Systems  Constitutional:  Positive for chills, diaphoresis and fever.  HENT: Negative.    Eyes: Negative.   Respiratory: Negative.    Cardiovascular: Negative.   Gastrointestinal: Negative.   Endocrine: Negative.   Genitourinary: Negative.    Musculoskeletal:  Positive for back pain and gait problem.       Spasms  Skin: Negative.   Allergic/Immunologic: Negative.   Neurological:  Positive for tremors, weakness and headaches.  Hematological: Negative.   Psychiatric/Behavioral:  Positive for confusion and dysphoric mood.   All other systems reviewed and are negative.      Objective:   Physical Exam Vitals and nursing note reviewed.  Constitutional:      Appearance: Normal appearance.  Cardiovascular:     Rate and Rhythm: Normal rate and regular rhythm.     Pulses: Normal pulses.     Heart sounds: Normal heart sounds.  Pulmonary:     Effort: Pulmonary effort is normal.     Breath sounds: Normal breath sounds.  Musculoskeletal:     Cervical back: Normal  range of motion and neck supple.     Comments: Normal Muscle Bulk and Muscle Testing Reveals:  Upper Extremities: Right: Decreased ROM 90 Degrees and Muscle Strength 4/5 Left Upper Extremity: Full ROM and Muscle Strength 5/5 Lumbar Paraspinal Tenderness: L-4-L-5 Lower Extremities : Full ROM and Muscle Strength 5/5 Arises from Table with ease Narrow Based Gait     Skin:    General: Skin is warm and dry.  Neurological:     Mental Status: He is alert and oriented to person, place, and time.  Psychiatric:        Mood and Affect: Mood normal.        Behavior: Behavior normal.         Assessment & Plan:  TBI : He is scheduled for Outpatient Therapy. He has a scheduled appointment with Neurosurgery. Continue to Monitor.  Right Lumbar Radiculitis: RX: Lumbar X-ray: Continue HEP as tolerated. Continue current medication regimen. Continue to Monitor.  Chronic Pain Syndrome: Refilled: Oxycodone 5 mg one tablet every 6 hours as needed for pain: #120. We will continue the opioid monitoring program, this consists of regular clinic visits, examinations, urine drug screen, pill counts as well as use of [place] Controlled Substance Reporting system. A 12 month History  has been reviewed on the [place] Controlled Substance Reporting System Today.    F/U with Dr Shearon Stalls in 4-6 weeks

## 2023-02-05 ENCOUNTER — Encounter: Payer: 59 | Admitting: Registered Nurse

## 2023-02-06 DIAGNOSIS — M5416 Radiculopathy, lumbar region: Secondary | ICD-10-CM | POA: Diagnosis not present

## 2023-02-06 DIAGNOSIS — Z6828 Body mass index (BMI) 28.0-28.9, adult: Secondary | ICD-10-CM | POA: Diagnosis not present

## 2023-02-06 DIAGNOSIS — S020XXD Fracture of vault of skull, subsequent encounter for fracture with routine healing: Secondary | ICD-10-CM | POA: Diagnosis not present

## 2023-02-12 ENCOUNTER — Other Ambulatory Visit: Payer: Self-pay

## 2023-02-13 ENCOUNTER — Ambulatory Visit: Payer: 59 | Admitting: Occupational Therapy

## 2023-02-13 ENCOUNTER — Ambulatory Visit: Payer: 59 | Admitting: Speech Pathology

## 2023-02-13 ENCOUNTER — Ambulatory Visit: Payer: 59 | Attending: Physician Assistant | Admitting: Physical Therapy

## 2023-02-13 NOTE — Therapy (Deleted)
OUTPATIENT SPEECH LANGUAGE PATHOLOGY EVALUATION   Patient Name: Shawn Mills MRN: [id number] DOB:03-25-1981, 42 y.o., male 70 Date: 02/13/2023  PCP: none listed REFERRING PROVIDER: Charlton Amor, PA-C  END OF SESSION:   Past Medical History:  Diagnosis Date   Carpal tunnel syndrome    Kidney stones    Past Surgical History:  Procedure Laterality Date   IR ANGIO INTRA EXTRACRAN SEL COM CAROTID INNOMINATE BILAT MOD SED  12/09/2022   IR ANGIO VERTEBRAL SEL VERTEBRAL BILAT MOD SED  12/09/2022   LITHOTRIPSY     Patient Active Problem List   Diagnosis Date Noted   TBI (traumatic brain injury) (HCC) 12/08/2022    ONSET DATE: ***   REFERRING DIAG: ***  THERAPY DIAG:  No diagnosis found.  Rationale for Evaluation and Treatment: {HABREHAB:27488}  SUBJECTIVE:   SUBJECTIVE STATEMENT: *** Pt accompanied by: {accompnied:27141}  PERTINENT HISTORY: 42 y.o. right-handed male with history of tobacco alcohol use.  Per chart review lives with significant other independent prior to admission.  Presented 12/07/2022 after falling backwards on his front porch striking the back of his head. admitted to IP rehab 01/04/2023 for inpatient therapies to consist of PT, ST and OT at least three hours five days a week., d/c 01/23/23  PAIN:  Are you having pain? {OPRCPAIN:27236}  FALLS: Has patient fallen in last 6 months?  {GEXBMWUX:32440}  LIVING ENVIRONMENT: Lives with: {OPRC lives with:25569::"lives with their family"} Lives in: {Lives in:25570}  PLOF:  Level of assistance: {NUUVOZD:66440} Employment: {SLPemployment:25674}  PATIENT GOALS: ***  OBJECTIVE:   DIAGNOSTIC FINDINGS: Stage manager (IP rehab) Pt presents with cognitive linguistic impairments in the areas of attention, short term recall, awareness, orientation, problem solving, executive functions, verbal expression, and auditory comprehension. Pt unable to maintain attention for formalized assessment at  this time. Pt challenged with describing pictured scene and coherently providing biographical details beyond his name. Pt offers generalized responses to direct questions and is very concerned about being able to go home. He is disoriented to place, situation, and time. He is able to correctly verbalize days of the week in forwards order (independently) and backwards order (min cues for attention). Pt needed frequent redirection to structured and unstructured tasks throughout session. He impulsively stood from bed multiple times, despite redirection. RN present at end of session to help redirect pt back to Posey bed due to increased pt agitation. Pt's behaviors align with [place] at this time.   Clinical Impression/Discharge Summary:   Overall, pt demonstrated good progress this stay as evidenced by improved short term memory, orientation, attention, problem solving, and verbal expression. However, despite progress, moderate cognitive-linguistic deficits remain overall and continued ST is recommended. Pt/family education completed and he is set to d/c home with 24/7 support. He would benefit from continued ST services upon d/c to further target cognition and language to acilitate return to previous roles/responsibilities.   COGNITION: Overall cognitive status: {cognition:24006} Areas of impairment:  {cognitiveimpairmentslp:27409} Functional deficits: ***  COGNITIVE COMMUNICATION: Following directions: {commands:24018}  Auditory comprehension: {WFL-Impaired:25365} Verbal expression: {WFL-Impaired:25365} Functional communication: {WFL-Impaired:25365}  ORAL MOTOR EXAMINATION: Overall status: {OMESLP2:27645} Comments: ***  STANDARDIZED ASSESSMENTS: {SLPstandardizedassessment:27092}  PATIENT REPORTED OUTCOME MEASURES (PROM): {SLPPROM:27095}   TODAY'S TREATMENT:  DATE: ***   PATIENT EDUCATION: Education details: *** Person educated: {Person educated:25204} Education method: {Education Method:25205} Education comprehension: {Education Comprehension:25206}   GOALS: Goals reviewed with patient? {yes/no:20286}  SHORT TERM GOALS: Target date: ***  *** Baseline: Goal status: INITIAL  2.  *** Baseline:  Goal status: INITIAL  3.  *** Baseline:  Goal status: INITIAL  4.  *** Baseline:  Goal status: INITIAL  5.  *** Baseline:  Goal status: INITIAL  6.  *** Baseline:  Goal status: INITIAL  LONG TERM GOALS: Target date: ***  *** Baseline:  Goal status: INITIAL  2.  *** Baseline:  Goal status: INITIAL  3.  *** Baseline:  Goal status: INITIAL  4.  *** Baseline:  Goal status: INITIAL  5.  *** Baseline:  Goal status: INITIAL  6.  *** Baseline:  Goal status: INITIAL  ASSESSMENT:  CLINICAL IMPRESSION: Patient is a *** y.o. *** who was seen today for ***.   OBJECTIVE IMPAIRMENTS: include {SLPOBJIMP:27107}. These impairments are limiting patient from {SLPLIMIT:27108}. Factors affecting potential to achieve goals and functional outcome are {SLP factors:25450}.. Patient will benefit from skilled SLP services to address above impairments and improve overall function.  REHAB POTENTIAL: {rehabpotential:25112}  PLAN:  SLP FREQUENCY: {rehab frequency:25116}  SLP DURATION: {rehab duration:25117}  PLANNED INTERVENTIONS: {SLP treatment/interventions:25449}    Maia Breslow, CCC-SLP 02/13/2023, 8:01 AM

## 2023-02-19 ENCOUNTER — Other Ambulatory Visit: Payer: Self-pay

## 2023-02-19 MED ORDER — TOPIRAMATE 25 MG PO TABS: 25.0000 mg | ORAL_TABLET | Freq: Two times a day (BID) | ORAL | 0 refills | Status: DC | PRN

## 2023-02-19 MED ORDER — QUETIAPINE FUMARATE 50 MG PO TABS: 50.0000 mg | ORAL_TABLET | Freq: Every evening | ORAL | 0 refills | Status: DC | PRN

## 2023-02-28 ENCOUNTER — Encounter: Payer: 59 | Attending: Registered Nurse | Admitting: Physical Medicine and Rehabilitation

## 2023-02-28 ENCOUNTER — Encounter: Payer: Self-pay | Admitting: Physical Medicine and Rehabilitation

## 2023-02-28 ENCOUNTER — Telehealth: Payer: Self-pay | Admitting: *Deleted

## 2023-02-28 VITALS — BP 130/78 | HR 105 | Ht 70.0 in | Wt 196.0 lb

## 2023-02-28 DIAGNOSIS — M5416 Radiculopathy, lumbar region: Secondary | ICD-10-CM | POA: Diagnosis not present

## 2023-02-28 DIAGNOSIS — Z79891 Long term (current) use of opiate analgesic: Secondary | ICD-10-CM | POA: Insufficient documentation

## 2023-02-28 DIAGNOSIS — E875 Hyperkalemia: Secondary | ICD-10-CM | POA: Insufficient documentation

## 2023-02-28 DIAGNOSIS — S069X1D Unspecified intracranial injury with loss of consciousness of 30 minutes or less, subsequent encounter: Secondary | ICD-10-CM | POA: Diagnosis not present

## 2023-02-28 DIAGNOSIS — Z5181 Encounter for therapeutic drug level monitoring: Secondary | ICD-10-CM | POA: Insufficient documentation

## 2023-02-28 DIAGNOSIS — G894 Chronic pain syndrome: Secondary | ICD-10-CM | POA: Insufficient documentation

## 2023-02-28 DIAGNOSIS — R7989 Other specified abnormal findings of blood chemistry: Secondary | ICD-10-CM | POA: Diagnosis not present

## 2023-02-28 MED ORDER — HYDROCODONE-ACETAMINOPHEN 5-325 MG PO TABS: 2.0000 | ORAL_TABLET | Freq: Three times a day (TID) | ORAL | 0 refills | Status: DC | PRN

## 2023-02-28 MED ORDER — TOPIRAMATE 25 MG PO TABS: 25.0000 mg | ORAL_TABLET | Freq: Two times a day (BID) | ORAL | 3 refills | Status: DC | PRN

## 2023-02-28 MED ORDER — GABAPENTIN 300 MG PO CAPS: 600.0000 mg | ORAL_CAPSULE | Freq: Three times a day (TID) | ORAL | 5 refills | Status: DC

## 2023-02-28 MED ORDER — BACLOFEN 10 MG PO TABS: 20.0000 mg | ORAL_TABLET | Freq: Two times a day (BID) | ORAL | 5 refills | Status: DC | PRN

## 2023-02-28 NOTE — Patient Instructions (Signed)
Today, we renewed some of your medications and stopped others.  You already have a neurosurgical referral for your leg right leg, and an MRI pending; I recommend going to their office to discuss any delays in getting this MRI, and let me know if you need [place] to order through our office.  I do think this is the best thing long-term for your leg pain.  For pain control, switch from oxycodone 5 mg 3 times daily to Norco 5 mg 2 tablets up to 3 times daily.  I have refilled this medication for 3 months.  If you have any issues with the medication, let me know and we can switch back.  Plan would be to wean this medication after surgery.   Stop Tylenol, as there is already Tylenol in the Norco.  Stop Lyrica.  Increase gabapentin to 600 mg 3 times daily.  Increase baclofen to 20 mg up to twice daily.  I refilled Topamax today for intermittent headaches.  Get blood work done today to monitor for kidney and liver function with your current medications.  I will contact you if any medications need adjusted.  Continue to abstain from alcohol.  Also, discussed abstaining from [facility] products as part of your pain contract today.  Follow-up with me in 3 months, or call the clinic if a sooner appointment is needed.  I can generally respond to MyChart questions within 48 hours.

## 2023-02-28 NOTE — Telephone Encounter (Signed)
Substitution for 10 mg tablets TID PRN is fine. Thank you!

## 2023-02-28 NOTE — Progress Notes (Unsigned)
Subjective:    Patient ID: Shawn Mills, male    DOB: 1981-05-07, 42 y.o.   MRN: [id number]  HPI   Shawn Mills is a 42 y.o. year old male  who  has a past medical history of Carpal tunnel syndrome and Kidney stones.   They are presenting to PM&R clinic for follow up related to TBI s/p IPR admission .  Plan from last visit: TBI : He is scheduled for Outpatient Therapy. He has a scheduled appointment with Neurosurgery. Continue to Monitor.   Right Lumbar Radiculitis: RX: Lumbar X-ray: Continue HEP as tolerated. Continue current medication regimen. Continue to Monitor.   Chronic Pain Syndrome: Refilled: Oxycodone 5 mg one tablet every 6 hours as needed for pain: #120. We will continue the opioid monitoring program, this consists of regular clinic visits, examinations, urine drug screen, pill counts as well as use of [place] Controlled Substance Reporting system. A 12 month History has been reviewed on the [place] Controlled Substance Reporting System Today.     F/U with Dr Shearon Stalls in 4-6 weeks   -7/13-14  . Main complaint is back with likely radiculopathy (acute on chronic sx). MRI from 12/22 performed with hx of numbness in the right leg--doesn't demontrate any foraminal stenosis on right             - lyrica 25mg  tid to 50mg  tid--leave as is today             -continue gabapentin at 400mg  tid may reduce to 300mg  since starting increased dose Lyrica             -reduced amitriptyline to 25mg  at bedtime             -needs kpad for back--he's on wait list             -looked at pelvic CT and no sign of pelvic/hip trauma or DJD either             -EMG/NCS as outpt?  LUMBAR SPINE - 2-3 VIEW 01/31/23   COMPARISON:  None Available.   FINDINGS: No fracture or bone lesion.  Normal vertebral body alignment.   Mild loss of disc height at L3-L4 and L4-L5. Remaining lumbar disc spaces are well preserved.   Soft tissues are unremarkable.   IMPRESSION: 1. No fracture or  acute finding. 2. Mild disc degenerative changes at L3-L4 L4-L5.  Interval Hx:  - Therapies: Missed last appointment of PT but otherwise has been going. Has been doing HEP and stretching as instructed.    - Follow ups: See above.    - Falls:***   - DME:***   - Medications: On Oxycodone 5 mg one tablet every 6 hours as needed for pain: #120. Last filled /24/24. No benefit, they do not last and "they mess with my attitude and make me ill".   Gabapentin 300 mg TID filled 7/16; works well, no side effects.   Lyrica 50 mg TID filled 7/16; unsure of benefit, has not worked as well as the gabapentin.   Taking baclofen with benefit, has been taking 2 instead of 1.5, more in the afternoon.   Was using topomax, now out, he's been getting them 2-3 times per week worsened by his back pain.   "I have been eating 6 tyle ol a day extra"   No longer on seroquel or trazodone, has not noticed any mood changes.    - Other concerns: Feels his back is getting worse; feels his back "  kills" him when he is sleeping. He says laying on any hard surface causes severe back pain.   Feels like lightning going down his right leg from his butt, into his medial knee and stops. Also reports calf cramping. Much worse than with his prior mri 2022.   Has been not eating because of pain, throwing up a lot.   Has been drinking non-acloholic beer mostly; no other substance abuse. Took one THC gummy, which was helpful.   Pain Inventory Average Pain 10 Pain Right Now 10 My pain is constant, sharp, burning, stabbing, and aching  LOCATION OF PAIN  head knee leg back buttock hip  BOWEL Number of stools per week: 7 Oral laxative use No  Type of laxative n/a Enema or suppository use No  History of colostomy No  Incontinent No   BLADDER Normal In and out cath, frequency na Able to self cath  na Bladder incontinence No  Frequent urination No  Leakage with coughing No  Difficulty starting stream No   Incomplete bladder emptying No    Mobility walk without assistance how many minutes can you walk? 3 ability to climb steps?  yes do you drive?  yes Do you have any goals in this area?  yes  Function not employed: date last employed    Neuro/Psych dizziness  Prior Studies x-rays  Physicians involved in your care F/u   History reviewed. No pertinent family history. Social History   Socioeconomic History   Marital status: Unknown    Spouse name: Not on file   Number of children: Not on file   Years of education: Not on file   Highest education level: Not on file  Occupational History   Not on file  Tobacco Use   Smoking status: Former    Types: Cigarettes   Smokeless tobacco: Not on file  Substance and Sexual Activity   Alcohol use: Yes    Comment: daiily   Drug use: No   Sexual activity: Yes    Birth control/protection: None  Other Topics Concern   Not on file  Social History Narrative   ** Merged History Encounter **       Social Determinants of Health   Financial Resource Strain: Not on file  Food Insecurity: Not on file  Transportation Needs: Not on file  Physical Activity: Not on file  Stress: Not on file  Social Connections: Not on file   Past Surgical History:  Procedure Laterality Date   IR ANGIO INTRA EXTRACRAN SEL COM CAROTID INNOMINATE BILAT MOD SED  12/09/2022   IR ANGIO VERTEBRAL SEL VERTEBRAL BILAT MOD SED  12/09/2022   LITHOTRIPSY     Past Medical History:  Diagnosis Date   Carpal tunnel syndrome    Kidney stones    BP 130/78   Pulse (!) 105   Ht 5\' 10"  (1.778 m)   Wt 196 lb (88.9 kg)   SpO2 95%   BMI 28.12 kg/m   Opioid Risk Score:   Fall Risk Score:  `1  Depression screen [facility] 2/9     01/31/2023    9:59 AM  Depression screen PHQ 2/9  Decreased Interest 0  Down, Depressed, Hopeless 0  PHQ - 2 Score 0  Altered sleeping 1  Tired, decreased energy 2  Change in appetite 0  Feeling bad or failure about yourself  2   Trouble concentrating 0  Moving slowly or fidgety/restless 1  Suicidal thoughts 0  PHQ-9 Score 6  Review of Systems  Musculoskeletal:  Positive for back pain.       RT knee hip leg buttock head pa in  All other systems reviewed and are negative.      Objective:   Physical Exam   Rle 3+ patellar reflex. Negative ankle jerk, babinski Sensory loss medial right knee and thigh 3+/5 HF, otherwise 4/5 RLE      Assessment & Plan:

## 2023-02-28 NOTE — Telephone Encounter (Signed)
Per pharmacist need new Hydrocodone RX 10-325 and he will can order for 5-325.

## 2023-02-28 NOTE — Telephone Encounter (Signed)
Per fax from [facility] Hydrocodone/apap 5-235 on back order. They do have 10-325. Please advise.

## 2023-03-01 LAB — COMPREHENSIVE METABOLIC PANEL
ALT: 28 IU/L (ref 0–44)
AST: 28 IU/L (ref 0–40)
Albumin: 4.7 g/dL (ref 4.1–5.1)
Alkaline Phosphatase: 186 IU/L — ABNORMAL HIGH (ref 44–121)
BUN/Creatinine Ratio: 5 — ABNORMAL LOW (ref 9–20)
BUN: 5 mg/dL — ABNORMAL LOW (ref 6–24)
Bilirubin Total: 0.7 mg/dL (ref 0.0–1.2)
CO2: 24 mmol/L (ref 20–29)
Calcium: 10.3 mg/dL — ABNORMAL HIGH (ref 8.7–10.2)
Chloride: 97 mmol/L (ref 96–106)
Creatinine, Ser: 0.91 mg/dL (ref 0.76–1.27)
Globulin, Total: 3.6 g/dL (ref 1.5–4.5)
Glucose: 98 mg/dL (ref 70–99)
Potassium: 5.9 mmol/L — ABNORMAL HIGH (ref 3.5–5.2)
Sodium: 135 mmol/L (ref 134–144)
Total Protein: 8.3 g/dL (ref 6.0–8.5)
eGFR: 109 mL/min/{1.73_m2} (ref >59)

## 2023-03-01 NOTE — Telephone Encounter (Signed)
Dr. Shearon Stalls patient significant other Melissa lvm stating [facility] has the 5-325 Hydrocodone/APAP. She would like medication sent there.  Per your message on yesterday you had changed it to the 10 mg. The med list does not reflect this.

## 2023-03-02 ENCOUNTER — Encounter: Payer: Self-pay | Admitting: Physical Medicine and Rehabilitation

## 2023-03-02 DIAGNOSIS — E875 Hyperkalemia: Secondary | ICD-10-CM | POA: Insufficient documentation

## 2023-03-02 DIAGNOSIS — R7989 Other specified abnormal findings of blood chemistry: Secondary | ICD-10-CM | POA: Insufficient documentation

## 2023-03-02 MED ORDER — HYDROCODONE-ACETAMINOPHEN 5-325 MG PO TABS: 1.0000 | ORAL_TABLET | Freq: Three times a day (TID) | ORAL | 0 refills | Status: DC | PRN

## 2023-03-02 MED ORDER — HYDROCODONE-ACETAMINOPHEN 5-325 MG PO TABS: 1.0000 | ORAL_TABLET | Freq: Three times a day (TID) | ORAL | 0 refills | Status: AC | PRN

## 2023-03-02 NOTE — Telephone Encounter (Signed)
[facility] (Key: [id number]) - [id number] HYDROcodone-Acetaminophen 5-325MG  tablets Status: PA RequestCreated: August 23rd, 2024 [id number] Sent: August 23rd, 2024

## 2023-03-02 NOTE — Telephone Encounter (Signed)
Dalessandro [facility] (Key: [id number]) - [id number] HYDROcodone-Acetaminophen 5-325MG  tablets Status: PA Response - ApprovedCreated: August 23rd, 2024 [id number] Sent: August 23rd, 2024

## 2023-03-09 ENCOUNTER — Other Ambulatory Visit: Payer: Self-pay

## 2023-03-09 ENCOUNTER — Encounter (HOSPITAL_COMMUNITY): Payer: Self-pay | Admitting: *Deleted

## 2023-03-09 ENCOUNTER — Emergency Department (HOSPITAL_COMMUNITY)
Admission: EM | Admit: 2023-03-09 | Discharge: 2023-03-09 | Disposition: A | Discharge status: Home / Self Care | Payer: 59 | Attending: Emergency Medicine | Admitting: Emergency Medicine

## 2023-03-09 DIAGNOSIS — M5441 Lumbago with sciatica, right side: Secondary | ICD-10-CM | POA: Diagnosis not present

## 2023-03-09 DIAGNOSIS — Z7982 Long term (current) use of aspirin: Secondary | ICD-10-CM | POA: Diagnosis not present

## 2023-03-09 DIAGNOSIS — M545 Low back pain, unspecified: Secondary | ICD-10-CM | POA: Diagnosis present

## 2023-03-09 DIAGNOSIS — M5431 Sciatica, right side: Secondary | ICD-10-CM

## 2023-03-09 LAB — COMPREHENSIVE METABOLIC PANEL
ALT: 25 U/L (ref 0–44)
AST: 27 U/L (ref 15–41)
Albumin: 4.1 g/dL (ref 3.5–5.0)
Alkaline Phosphatase: 108 U/L (ref 38–126)
Anion gap: 14 (ref 5–15)
BUN: <5 mg/dL — ABNORMAL LOW (ref 6–20)
CO2: 24 mmol/L (ref 22–32)
Calcium: 9 mg/dL (ref 8.9–10.3)
Chloride: 94 mmol/L — ABNORMAL LOW (ref 98–111)
Creatinine, Ser: 0.77 mg/dL (ref 0.61–1.24)
GFR, Estimated: >60 mL/min (ref >60)
Glucose, Bld: 93 mg/dL (ref 70–99)
Potassium: 3.7 mmol/L (ref 3.5–5.1)
Sodium: 132 mmol/L — ABNORMAL LOW (ref 135–145)
Total Bilirubin: 0.2 mg/dL — ABNORMAL LOW (ref 0.3–1.2)
Total Protein: 7.5 g/dL (ref 6.5–8.1)

## 2023-03-09 LAB — CBC
HCT: 47.4 % (ref 39.0–52.0)
Hemoglobin: 16.5 g/dL (ref 13.0–17.0)
MCH: 32.7 pg (ref 26.0–34.0)
MCHC: 34.8 g/dL (ref 30.0–36.0)
MCV: 93.9 fL (ref 80.0–100.0)
Platelets: 438 10*3/uL — ABNORMAL HIGH (ref 150–400)
RBC: 5.05 MIL/uL (ref 4.22–5.81)
RDW: 13.6 % (ref 11.5–15.5)
WBC: 9.3 10*3/uL (ref 4.0–10.5)
nRBC: 0 % (ref 0.0–0.2)

## 2023-03-09 MED ORDER — OXYCODONE-ACETAMINOPHEN 5-325 MG PO TABS
1.0000 | ORAL_TABLET | Freq: Once | ORAL | Status: AC
  Administered 2023-03-09: 1 via ORAL
  Filled 2023-03-09: qty 1

## 2023-03-09 MED ORDER — HYDROMORPHONE HCL 1 MG/ML IJ SOLN
2.0000 mg | Freq: Once | INTRAMUSCULAR | Status: AC
  Administered 2023-03-09: 2 mg via INTRAMUSCULAR
  Filled 2023-03-09: qty 2

## 2023-03-09 MED ORDER — LIDOCAINE 5 % EX PTCH
1.0000 | MEDICATED_PATCH | CUTANEOUS | Status: DC
  Administered 2023-03-09: 1 via TRANSDERMAL
  Filled 2023-03-09 (×2): qty 1
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
## 2023-03-09 NOTE — ED Provider Notes (Signed)
[place] EMERGENCY DEPARTMENT AT [facility] Provider Note   CSN: [id number] Arrival date & time: 03/09/23  1914     History {Add pertinent medical, surgical, social history, OB history to HPI:1} Chief Complaint  Patient presents with   Back Pain    Shawn Mills is a 42 y.o. male.   Back Pain      Home Medications Prior to Admission medications   Medication Sig Start Date End Date Taking? Authorizing Provider  aspirin 81 MG chewable tablet Chew 1 tablet (81 mg total) by mouth daily. 01/22/23   Angiulli, Mcarthur Rossetti, PA-C  baclofen (LIORESAL) 10 MG tablet Take 2 tablets (20 mg total) by mouth 2 (two) times daily as needed for muscle spasms. 02/28/23   Elijah Birk C, DO  diclofenac Sodium (VOLTAREN) 1 % GEL Apply 4 g topically 2 (two) times daily. 01/22/23   Angiulli, Mcarthur Rossetti, PA-C  folic acid (FOLVITE) 1 MG tablet Take 1 tablet (1 mg total) by mouth daily. 01/22/23   Angiulli, Mcarthur Rossetti, PA-C  gabapentin (NEURONTIN) 300 MG capsule Take 2 capsules (600 mg total) by mouth 3 (three) times daily. 02/28/23   Angelina Sheriff, DO  HYDROcodone-acetaminophen (NORCO) 5-325 MG tablet Take 1-2 tablets by mouth 3 (three) times daily as needed for moderate pain. 03/31/23 04/30/23  Angelina Sheriff, DO  HYDROcodone-acetaminophen (NORCO) 5-325 MG tablet Take 1-2 tablets by mouth 3 (three) times daily as needed for moderate pain. 04/30/23 05/30/23  Angelina Sheriff, DO  HYDROcodone-acetaminophen (NORCO) 5-325 MG tablet Take 1-2 tablets by mouth 3 (three) times daily as needed for moderate pain. 03/02/23 04/01/23  Elijah Birk C, DO  lidocaine (LIDODERM) 5 % Place 2 patches onto the skin daily. Remove & Discard patch within 12 hours or as directed by MD 01/22/23   Angiulli, Mcarthur Rossetti, PA-C  Multiple Vitamin (MULTIVITAMIN WITH MINERALS) TABS tablet Take 1 tablet by mouth daily. 01/22/23   Angiulli, Mcarthur Rossetti, PA-C  nicotine (NICODERM CQ - DOSED IN MG/24 HOURS) 14 mg/24hr patch Place 1 patch (14  mg patch) onto the skin daily for 2 weeks then 7 mg patch daily x 3 weeks and stop Patient not taking: Reported on 02/28/2023 01/22/23   Angiulli, Mcarthur Rossetti, PA-C  topiramate (TOPAMAX) 25 MG tablet Take 1 tablet (25 mg total) by mouth 2 (two) times daily as needed (headaches). 02/28/23   Angelina Sheriff, DO      Allergies    Patient has no known allergies.    Review of Systems   Review of Systems  Musculoskeletal:  Positive for back pain.    Physical Exam Updated Vital Signs BP 123/64   Pulse 94   Temp 97.6 F (36.4 C)   Resp 18   Ht 5\' 10"  (1.778 m)   Wt 88.9 kg   SpO2 98%   BMI 28.12 kg/m  Physical Exam  ED Results / Procedures / Treatments   Labs (all labs ordered are listed, but only abnormal results are displayed) Labs Reviewed  COMPREHENSIVE METABOLIC PANEL - Abnormal; Notable for the following components:      Result Value   Sodium 132 (*)    Chloride 94 (*)    BUN <5 (*)    Total Bilirubin 0.2 (*)    All other components within normal limits  CBC - Abnormal; Notable for the following components:   Platelets 438 (*)    All other components within normal limits    EKG None  Radiology No  results found.  Procedures Procedures  {Document cardiac monitor, telemetry assessment procedure when appropriate:1}  Medications Ordered in ED Medications  oxyCODONE-acetaminophen (PERCOCET/ROXICET) 5-325 MG per tablet 1 tablet (1 tablet Oral Given 03/09/23 1936)    ED Course/ Medical Decision Making/ A&P   {   Click here for ABCD2, HEART and other calculatorsREFRESH Note before signing :1}                              Medical Decision Making  ***  {Document critical care time when appropriate:1} {Document review of labs and clinical decision tools ie heart score, Chads2Vasc2 etc:1}  {Document your independent review of radiology images, and any outside records:1} {Document your discussion with family members, caretakers, and with consultants:1} {Document  social determinants of health affecting pt's care:1} {Document your decision making why or why not admission, treatments were needed:1} Final Clinical Impression(s) / ED Diagnoses Final diagnoses:  None    Rx / DC Orders ED Discharge Orders     None

## 2023-03-09 NOTE — ED Notes (Signed)
Patient verbalizes understanding of discharge instructions. Opportunity for questioning and answers were provided. Armband removed by staff, pt discharged from ED. Pt ambulatory to ED waiting room with steady gait.  

## 2023-03-09 NOTE — ED Triage Notes (Signed)
The pt is under the care of doctors and he is on many meds but he reports that he is in extreme pain and that nothing helps his pain

## 2023-03-09 NOTE — Discharge Instructions (Addendum)
You are seen today for back pain.  This is likely related to sciatica.  Please make an appointment to be seen by your spine team as well as your pain management doctor to be reevaluated.  Please return to the emergency department if you are having new or worsening pain, numbness, tingling, incontinence, difficulty with bowel or bladder movements.  If the lidocaine patch is helpful you can find these available over-the-counter.

## 2023-03-13 ENCOUNTER — Encounter: Payer: Self-pay | Admitting: Physical Medicine and Rehabilitation

## 2023-03-18 ENCOUNTER — Encounter (HOSPITAL_COMMUNITY): Payer: Self-pay | Admitting: *Deleted

## 2023-03-18 ENCOUNTER — Other Ambulatory Visit: Payer: Self-pay

## 2023-03-18 ENCOUNTER — Emergency Department (HOSPITAL_COMMUNITY)
Admission: EM | Admit: 2023-03-18 | Discharge: 2023-03-19 | Discharge status: Against Medical Advice | Payer: 59 | Attending: Emergency Medicine | Admitting: Emergency Medicine

## 2023-03-18 DIAGNOSIS — Y92009 Unspecified place in unspecified non-institutional (private) residence as the place of occurrence of the external cause: Secondary | ICD-10-CM | POA: Insufficient documentation

## 2023-03-18 DIAGNOSIS — M545 Low back pain, unspecified: Secondary | ICD-10-CM | POA: Insufficient documentation

## 2023-03-18 DIAGNOSIS — R519 Headache, unspecified: Secondary | ICD-10-CM | POA: Insufficient documentation

## 2023-03-18 DIAGNOSIS — Z5321 Procedure and treatment not carried out due to patient leaving prior to being seen by health care provider: Secondary | ICD-10-CM | POA: Diagnosis not present

## 2023-03-18 DIAGNOSIS — W0110XA Fall on same level from slipping, tripping and stumbling with subsequent striking against unspecified object, initial encounter: Secondary | ICD-10-CM | POA: Insufficient documentation

## 2023-03-18 HISTORY — DX: Unspecified intracranial injury with loss of consciousness status unknown, initial encounter: S06.9XAA

## 2023-03-18 NOTE — ED Triage Notes (Signed)
The pt fell in his house last night he stumbled and struck the   lt side of his head and the pt had back pain  and his legs gave way  he's been yawning a lot all day which is new   hx tbi

## 2023-03-18 NOTE — ED Triage Notes (Signed)
The pt pain is severein his lower back and rt side of his head

## 2023-03-19 NOTE — ED Notes (Signed)
Pt called multiple times no answer 

## 2023-03-29 ENCOUNTER — Other Ambulatory Visit (INDEPENDENT_AMBULATORY_CARE_PROVIDER_SITE_OTHER): Payer: Self-pay | Admitting: Physician Assistant

## 2023-03-29 ENCOUNTER — Other Ambulatory Visit: Payer: Self-pay | Admitting: Physical Medicine and Rehabilitation

## 2023-03-29 NOTE — Telephone Encounter (Signed)
Family notified

## 2023-03-29 NOTE — Telephone Encounter (Signed)
Please review and advise. Dr. Shearon Stalls is out of the office.  Thank you.

## 2023-04-09 ENCOUNTER — Other Ambulatory Visit: Payer: Self-pay

## 2023-04-09 ENCOUNTER — Emergency Department (HOSPITAL_COMMUNITY)
Admission: EM | Admit: 2023-04-09 | Discharge: 2023-04-09 | Discharge status: Against Medical Advice | Payer: 59 | Attending: Emergency Medicine | Admitting: Emergency Medicine

## 2023-04-09 ENCOUNTER — Encounter (HOSPITAL_COMMUNITY): Payer: Self-pay

## 2023-04-09 DIAGNOSIS — R064 Hyperventilation: Secondary | ICD-10-CM | POA: Diagnosis not present

## 2023-04-09 DIAGNOSIS — Z5321 Procedure and treatment not carried out due to patient leaving prior to being seen by health care provider: Secondary | ICD-10-CM | POA: Insufficient documentation

## 2023-04-09 DIAGNOSIS — R1084 Generalized abdominal pain: Secondary | ICD-10-CM | POA: Diagnosis not present

## 2023-04-09 DIAGNOSIS — R0602 Shortness of breath: Secondary | ICD-10-CM | POA: Diagnosis not present

## 2023-04-09 DIAGNOSIS — R197 Diarrhea, unspecified: Secondary | ICD-10-CM | POA: Diagnosis not present

## 2023-04-09 DIAGNOSIS — R062 Wheezing: Secondary | ICD-10-CM | POA: Diagnosis not present

## 2023-04-09 DIAGNOSIS — I10 Essential (primary) hypertension: Secondary | ICD-10-CM | POA: Diagnosis not present

## 2023-04-09 DIAGNOSIS — R630 Anorexia: Secondary | ICD-10-CM | POA: Insufficient documentation

## 2023-04-09 DIAGNOSIS — R112 Nausea with vomiting, unspecified: Secondary | ICD-10-CM | POA: Insufficient documentation

## 2023-04-09 DIAGNOSIS — Z743 Need for continuous supervision: Secondary | ICD-10-CM | POA: Diagnosis not present

## 2023-04-09 LAB — COMPREHENSIVE METABOLIC PANEL
ALT: 67 U/L — ABNORMAL HIGH (ref 0–44)
AST: 72 U/L — ABNORMAL HIGH (ref 15–41)
Albumin: 3.4 g/dL — ABNORMAL LOW (ref 3.5–5.0)
Alkaline Phosphatase: 156 U/L — ABNORMAL HIGH (ref 38–126)
Anion gap: 12 (ref 5–15)
BUN: <5 mg/dL — ABNORMAL LOW (ref 6–20)
CO2: 26 mmol/L (ref 22–32)
Calcium: 9.2 mg/dL (ref 8.9–10.3)
Chloride: 97 mmol/L — ABNORMAL LOW (ref 98–111)
Creatinine, Ser: 0.91 mg/dL (ref 0.61–1.24)
GFR, Estimated: >60 mL/min (ref >60)
Glucose, Bld: 124 mg/dL — ABNORMAL HIGH (ref 70–99)
Potassium: 3.7 mmol/L (ref 3.5–5.1)
Sodium: 135 mmol/L (ref 135–145)
Total Bilirubin: 0.7 mg/dL (ref 0.3–1.2)
Total Protein: 6.8 g/dL (ref 6.5–8.1)

## 2023-04-09 LAB — CBC
HCT: 51 % (ref 39.0–52.0)
Hemoglobin: 18 g/dL — ABNORMAL HIGH (ref 13.0–17.0)
MCH: 32.5 pg (ref 26.0–34.0)
MCHC: 35.3 g/dL (ref 30.0–36.0)
MCV: 92.1 fL (ref 80.0–100.0)
Platelets: 451 10*3/uL — ABNORMAL HIGH (ref 150–400)
RBC: 5.54 MIL/uL (ref 4.22–5.81)
RDW: 13.7 % (ref 11.5–15.5)
WBC: 9.1 10*3/uL (ref 4.0–10.5)
nRBC: 0 % (ref 0.0–0.2)

## 2023-04-09 LAB — URINALYSIS, ROUTINE W REFLEX MICROSCOPIC
Bilirubin Urine: NEGATIVE
Glucose, UA: NEGATIVE mg/dL
Hgb urine dipstick: NEGATIVE
Ketones, ur: NEGATIVE mg/dL
Leukocytes,Ua: NEGATIVE
Nitrite: NEGATIVE
Protein, ur: NEGATIVE mg/dL
Specific Gravity, Urine: 1.009 (ref 1.005–1.030)
pH: 9 — ABNORMAL HIGH (ref 5.0–8.0)

## 2023-04-09 LAB — LIPASE, BLOOD: Lipase: 26 U/L (ref 11–51)

## 2023-04-09 LAB — MAGNESIUM: Magnesium: 2 mg/dL (ref 1.7–2.4)

## 2023-04-09 MED ORDER — ONDANSETRON 4 MG PO TBDP
8.0000 mg | ORAL_TABLET | Freq: Once | ORAL | Status: AC
  Administered 2023-04-09: 8 mg via ORAL
  Filled 2023-04-09: qty 2
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
## 2023-04-09 NOTE — ED Triage Notes (Signed)
Pt arrived via [facility] from home for c/o N/V/Dx3d, lack of appetitex2wks. Pt c/o SOB started today

## 2023-04-21 ENCOUNTER — Emergency Department (HOSPITAL_COMMUNITY): Payer: 59

## 2023-04-21 ENCOUNTER — Emergency Department (HOSPITAL_COMMUNITY)
Admission: EM | Admit: 2023-04-21 | Discharge: 2023-04-22 | Disposition: A | Discharge status: Home / Self Care | Payer: 59 | Attending: Emergency Medicine | Admitting: Emergency Medicine

## 2023-04-21 ENCOUNTER — Encounter (HOSPITAL_COMMUNITY): Payer: Self-pay | Admitting: *Deleted

## 2023-04-21 ENCOUNTER — Other Ambulatory Visit: Payer: Self-pay

## 2023-04-21 DIAGNOSIS — S39012A Strain of muscle, fascia and tendon of lower back, initial encounter: Secondary | ICD-10-CM | POA: Diagnosis not present

## 2023-04-21 DIAGNOSIS — M47816 Spondylosis without myelopathy or radiculopathy, lumbar region: Secondary | ICD-10-CM | POA: Diagnosis not present

## 2023-04-21 DIAGNOSIS — M48061 Spinal stenosis, lumbar region without neurogenic claudication: Secondary | ICD-10-CM | POA: Diagnosis not present

## 2023-04-21 DIAGNOSIS — S3992XA Unspecified injury of lower back, initial encounter: Secondary | ICD-10-CM | POA: Diagnosis not present

## 2023-04-21 DIAGNOSIS — M51369 Other intervertebral disc degeneration, lumbar region without mention of lumbar back pain or lower extremity pain: Secondary | ICD-10-CM | POA: Diagnosis not present

## 2023-04-21 MED ORDER — KETOROLAC TROMETHAMINE 15 MG/ML IJ SOLN
15.0000 mg | Freq: Once | INTRAMUSCULAR | Status: AC
  Administered 2023-04-21: 15 mg via INTRAMUSCULAR
  Filled 2023-04-21: qty 1

## 2023-04-21 MED ORDER — OXYCODONE HCL 5 MG PO TABS
5.0000 mg | ORAL_TABLET | Freq: Once | ORAL | Status: AC
  Administered 2023-04-21: 5 mg via ORAL
  Filled 2023-04-21: qty 1
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
## 2023-04-21 NOTE — ED Triage Notes (Cosign Needed)
He took a vicodin approx 30 minutes ago  that has not helped his pain

## 2023-04-22 DIAGNOSIS — S39012A Strain of muscle, fascia and tendon of lower back, initial encounter: Secondary | ICD-10-CM | POA: Diagnosis not present

## 2023-04-22 MED ORDER — HYDROMORPHONE HCL 1 MG/ML IJ SOLN
0.5000 mg | Freq: Once | INTRAMUSCULAR | Status: AC
  Administered 2023-04-22: 0.5 mg via INTRAMUSCULAR
  Filled 2023-04-22: qty 1

## 2023-04-22 MED ORDER — METHOCARBAMOL 500 MG PO TABS: 500.0000 mg | ORAL_TABLET | Freq: Three times a day (TID) | ORAL | 0 refills | Status: DC | PRN

## 2023-04-22 NOTE — Discharge Instructions (Signed)
You were seen in the emergency department today for your back pain.  Your physical exam and vital signs are very reassuring.  The muscles in your back are in what is called spasm, meaning they are inappropriately tightened up.  This can be quite painful.  To help with your pain you may take Tylenol and / or NSAID medication (such as ibuprofen or naproxen) to help with your pain.  Additionally, you have been prescribed a muscle relaxer called Robaxin to help relieve some of the muscle spasm.  Please be advised that this medication may make you very sleepy, so you should not drive or operate heavy machinery while you are taking it.  You may also utilize topical pain relief such as Biofreeze, IcyHot, or topical lidocaine patches.  I also recommend that you apply heat to the area, such as a hot shower or heating pad, and follow heat application with massage of the muscles that are most tight.  Please return to the emergency department if you develop any numbness/tingling/weakness in your arms or legs, any difficulty urinating, or urinary incontinence chest pain, shortness of breath, abdominal pain, nausea or vomiting that does not stop, or any other new severe symptoms.

## 2023-05-30 ENCOUNTER — Encounter: Payer: 59 | Attending: Registered Nurse | Admitting: Physical Medicine and Rehabilitation

## 2023-05-30 ENCOUNTER — Encounter: Payer: Self-pay | Admitting: Physical Medicine and Rehabilitation

## 2023-05-30 VITALS — BP 138/82 | HR 104 | Ht 70.0 in | Wt 190.8 lb

## 2023-05-30 DIAGNOSIS — G894 Chronic pain syndrome: Secondary | ICD-10-CM | POA: Insufficient documentation

## 2023-05-30 DIAGNOSIS — M5416 Radiculopathy, lumbar region: Secondary | ICD-10-CM | POA: Insufficient documentation

## 2023-05-30 DIAGNOSIS — Z79891 Long term (current) use of opiate analgesic: Secondary | ICD-10-CM | POA: Diagnosis not present

## 2023-05-30 DIAGNOSIS — R519 Headache, unspecified: Secondary | ICD-10-CM | POA: Diagnosis not present

## 2023-05-30 DIAGNOSIS — R413 Other amnesia: Secondary | ICD-10-CM | POA: Insufficient documentation

## 2023-05-30 DIAGNOSIS — Z5181 Encounter for therapeutic drug level monitoring: Secondary | ICD-10-CM | POA: Diagnosis not present

## 2023-05-30 DIAGNOSIS — M7918 Myalgia, other site: Secondary | ICD-10-CM | POA: Diagnosis not present

## 2023-05-30 MED ORDER — LIDOCAINE 5 % EX PTCH: 2.0000 | MEDICATED_PATCH | CUTANEOUS | 4 refills | Status: DC

## 2023-05-30 MED ORDER — METHOCARBAMOL 750 MG PO TABS: 750.0000 mg | ORAL_TABLET | Freq: Three times a day (TID) | ORAL | 5 refills | Status: DC | PRN

## 2023-05-30 NOTE — Patient Instructions (Signed)
Call Local [facility] PT clinics to start PT for your low back and Right leg. Call if you need a referral:  1) [facility] [email or web address] Clinic #: [phone]   2) [facility] PT clinic  [email or web address] Clinic #: [phone]  3) [facility] PT [facility] [email or web address] Clinic #: [phone]   Wean gabapentin down to 1 capsule (300 mg) three times daily for 2 weeks, then just 300 mg at night for one week, then stop.  Continue Norco 5 mg as prescribed; since you are using it rarely, you can use up to 10 mg or 2 tabs at once, but must stay at prescribed maximum 3 tabs daily.   Today I prescribed robaxin 750 mg three times daily for muscle spasms and re-prescribed lidocaine patches  You will see Dr. Wynn Banker for an evaluation to determine if back injections are a good option  I referred you to Dr. Kieth Brightly for memory issues  Follow up in 2 months
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
## 2023-06-04 NOTE — Progress Notes (Incomplete)
Subjective:    Patient ID: Shawn Mills, male    DOB: 03-May-1981, 42 y.o.   MRN: [id number]  HPI  Shawn Mills is a 42 y.o. year old male  who  has a past medical history of Carpal tunnel syndrome, Kidney stones, and TBI (traumatic brain injury) (HCC).  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   Plan from last visit:  Chronic pain syndrome Encounter for therapeutic drug monitoring Encounter for long-term opiate analgesic use For pain control, switch from oxycodone 5 mg 3 times daily to Norco 5 mg 2 tablets up to 3 times daily.  I have refilled this medication for 3 months.  If you have any issues with the medication, let me know and we can switch back.  Plan would be to wean this medication after surgery.    Stop Tylenol, as there is already Tylenol in the Norco.   Continue to abstain from alcohol.  Also, discussed abstaining from [facility] products as part of your pain contract today.   Traumatic brain injury, with loss of consciousness of 30 minutes or less, subsequent encounter -     Comprehensive metabolic panel   I refilled Topamax today for intermittent headaches.     Get blood work done today to monitor for kidney and liver function with your current medications.  I will contact you if any medications need adjusted - renal and liver function OK as below to contieu current medications.    Continue to abstain from alcohol.  Also, discussed abstaining from [facility] products as part of your pain contract today.   Follow-up with me in 3 months, or call the clinic if a sooner appointment is needed.  I can generally respond to MyChart questions within 48 hours.   Right lumbar radiculitis   Today, we renewed some of your medications and stopped others.  You already have a neurosurgical referral for your leg right leg, and an  MRI pending; I recommend going to their office to discuss any delays in getting this MRI, and let me know if you need [place] to order through our office.  I do think this is the best thing long-term for your leg pain.   Stop Lyrica.  Increase gabapentin to 600 mg 3 times daily.   Increase baclofen to 20 mg up to twice daily.   Hyperkalemia -     Comprehensive metabolic panel; Future Results of above labs show K 5.9; messaged patient regarding need to increase fluids and repeat labs in the next week to ensure downtrend.    Elevated LFTs -     Comprehensive metabolic panel; Future ALP mildly elevated, likely related to polytrauma, AST and ALT WNL. Trend with follow up.    Interval Hx:   - Therapies: Has not been doing any therapy; does stretches at home for his back but no formal program. Heat in the shower is helpful.    - Follow ups: Multiple ER visits since last evaluation for pain; first given Dilaudid IV and oxycodone, most recently Robaxin.     - Falls: "I fell 3 times, I fell off my parents porch". Says he had a shooting pain down his leg and it gave out entirely; was 2 weeks ago. No head strike, no Loc. Has not recurred.    - DME: none.    - Medications:  Gabapentin and hydrocodone 5mg  refilled 11/8.   Gabapentin has no effect - 600 mg TID. No side effects. Is concerned about his memory and his R hearing.   Hydrocodone he uses rarely; only took one today because he could not get up.   He is finished the robaxin; "it helped out a lot - it brought the pain way down." He says he is still on Baclofen 20 mg tabs; takes with the Norco.   Headaches are well controlled with Topomax; they haven't been bad lately but last month were every other day. Always over his forehead.   Ran out of lidoderm patches; they worked VERY well for him and he wants another script.    - Other concerns: "the people that were going to do the MRI (the bus)  - it never got done"... wasn't covered by  insurance, did not hear from surgeon's office regarding an appeal.   Mom is his only contact currently; he does not have a phone because his GF took it when she left him. He has a phone with wifi only with email.   Has quit smoking entirely.   Pain Inventory Average Pain 7 Pain Right Now 7 My pain is aching  In the last 24 hours, has pain interfered with the following? General activity 7 Relation with others 0 Enjoyment of life 0 What TIME of day is your pain at its worst? night Sleep (in general) Poor  Pain is worse with: bending Pain improves with: medication Relief from Meds:  not specified  No family history on file. Social History   Socioeconomic History  . Marital status: Married    Spouse name: Not on file  . Number of children: Not on file  . Years of education: Not on file  . Highest education level: Not on file  Occupational History  . Not on file  Tobacco Use  . Smoking status: Every Day    Types: Cigarettes  . Smokeless tobacco: Not on file  Vaping Use  . Vaping status: Never Used  Substance and Sexual Activity  . Alcohol use: Not Currently  . Drug use: No  . Sexual activity: Yes    Birth control/protection: None  Other Topics Concern  . Not on file  Social History Narrative   ** Merged History Encounter **       Social Determinants of Health   Financial Resource Strain: Not on file  Food Insecurity: Not on file  Transportation Needs: Not on file  Physical Activity: Not on file  Stress: Not on file  Social Connections: Not on file   Past Surgical History:  Procedure Laterality Date  . IR ANGIO INTRA EXTRACRAN SEL COM CAROTID INNOMINATE BILAT MOD SED  12/09/2022  . IR ANGIO VERTEBRAL SEL VERTEBRAL BILAT MOD SED  12/09/2022  . LITHOTRIPSY     Past Surgical History:  Procedure Laterality Date  . IR ANGIO INTRA EXTRACRAN SEL COM CAROTID INNOMINATE BILAT MOD SED  12/09/2022  . IR ANGIO VERTEBRAL SEL VERTEBRAL BILAT MOD SED  12/09/2022  . LITHOTRIPSY      Past Medical History:  Diagnosis Date  . Carpal tunnel syndrome   . Kidney stones   . TBI (traumatic brain injury) (HCC)    BP 138/82   Pulse (!) 104   Ht 5\' 10"  (1.778 m)   Wt 190 lb 12.8 oz (86.5 kg)   SpO2 96%   BMI 27.38 kg/m   Opioid Risk Score:   Fall Risk Score:  `1  Depression screen [facility] 2/9     05/30/2023   11:32 AM 02/28/2023   10:01 AM 01/31/2023    9:59 AM  Depression screen PHQ 2/9  Decreased Interest 1 1 0  Down, Depressed, Hopeless 1 1 0  PHQ - 2 Score 2 2 0  Altered sleeping   1  Tired, decreased energy   2  Change in appetite   0  Feeling bad or failure about yourself    2  Trouble concentrating   0  Moving slowly or fidgety/restless   1  Suicidal thoughts   0  PHQ-9 Score   6     Review of Systems  Constitutional:  Positive for appetite change.       Appetite has been poor  HENT: Negative.    Eyes: Negative.   Respiratory: Negative.    Cardiovascular: Negative.   Gastrointestinal: Negative.   Endocrine: Negative.   Genitourinary: Negative.   Musculoskeletal: Negative.   Skin: Negative.   Allergic/Immunologic: Negative.   Neurological:  Positive for headaches.  Hematological: Negative.   Psychiatric/Behavioral: Negative.    All other systems reviewed and are negative.      Objective:   Physical Exam   PE: Constitution: Appropriate appearance for age. No apparent distress  Resp: No respiratory distress. No accessory muscle usage. on RA and CTAB Cardio: Well perfused appearance. No peripheral edema. Abdomen: Nondistended. Nontender.   Psych: Anxious but appropriate Neuro: AAOx4. + Memory deficits - both short and long term, including attention difficulty. Remembers 0/3 objects at 5 minutes.   Neurologic Exam:   DTRs: Reflexes were 2+ in bilateral achilles, patella, biceps, BR and triceps. Babinsky: flexor responses b/l.   Hoffmans: negative b/l Sensory exam: revealed normal sensation in all dermatomal regions in bilateral  upper extremities, bilateral lower extremities, and with reduced sensation to light touch in right 5th digit and right lateral knee and extending to the low back Motor exam: strength 5/5 throughout bilateral upper extremities and bilateral lower extremities Coordination: Fine motor coordination was normal.   Gait: +antalgic gait favoring left foot; no trendelenburg. No assistive device.   MSK: + TTP throughout R thoracic and lumbar paraspinals, R flank. + Pain with facet loading > slump, without radicular symptoms.      Assessment & Plan:   Shawn Mills is a 42 y.o. year old male  who  has a past medical history of Carpal tunnel syndrome, Kidney stones, and TBI (traumatic brain injury) (HCC).   They are presenting to PM&R clinic for follow up related to traumatic SDH/SAH left frontal lobe and temporal lobe s/p fall off deck, with IPR 6/27-7/15/24, c/b pre-existing now severe RLE neuropathic pain and cognitive deficits.    Chronic pain syndrome Encounter for therapeutic drug monitoring Encounter for long-term opiate analgesic use  Continue Norco 5 mg as prescribed; since you are using it rarely, you can use up to 10 mg or 2 tabs at once, but must stay at prescribed maximum 3 tabs daily.   Follow up in 2 months  Right lumbar radiculitis   You will see Dr. Wynn Banker for an evaluation to determine if back injections are a good option  Wean gabapentin down to 1 capsule (300 mg) three times daily for 2 weeks, then just 300 mg at night for one week, then stop.  Myofascial pain on right side  Today I prescribed robaxin 750 mg three times daily for muscle spasms and re-prescribed lidocaine patches  Call Local [facility] PT clinics to start  PT for your low back and right leg. Call if you need a referral:  1) [facility] [email or web address] Clinic #: [phone]   2) [place] [facility]   [email or web address] Clinic #: [phone]  3) [facility] [email or web address] Clinic #: [phone]    Memory deficits -     Ambulatory referral to Neuropsychology  I referred you to Dr. Kieth Brightly for memory issues Educated patient on  role of chronic pain fixation with memory consolidation difficulties Encouraged use of phone alarms and reminders, since his GF is no longer present to coordinate his appointments/needs  Other orders -     Methocarbamol; Take 1 tablet (750 mg total) by mouth every 8 (eight) hours as needed for muscle spasms.  Dispense: 90 tablet; Refill: 5 -     Lidocaine; Place 2 patches onto the skin daily. Remove & Discard patch within 12 hours or as directed by MD  Dispense: 30 patch; Refill: 4

## 2023-06-05 DIAGNOSIS — R519 Headache, unspecified: Secondary | ICD-10-CM | POA: Insufficient documentation

## 2023-06-05 MED ORDER — HYDROCODONE-ACETAMINOPHEN 5-325 MG PO TABS: 1.0000 | ORAL_TABLET | Freq: Three times a day (TID) | ORAL | 0 refills | Status: DC | PRN

## 2023-06-19 ENCOUNTER — Encounter: Payer: 59 | Attending: Registered Nurse | Admitting: Physical Medicine & Rehabilitation

## 2023-06-19 DIAGNOSIS — M5416 Radiculopathy, lumbar region: Secondary | ICD-10-CM | POA: Insufficient documentation

## 2023-06-19 DIAGNOSIS — R519 Headache, unspecified: Secondary | ICD-10-CM | POA: Insufficient documentation

## 2023-06-19 DIAGNOSIS — Z79891 Long term (current) use of opiate analgesic: Secondary | ICD-10-CM | POA: Insufficient documentation

## 2023-06-19 DIAGNOSIS — M7918 Myalgia, other site: Secondary | ICD-10-CM | POA: Insufficient documentation

## 2023-06-19 DIAGNOSIS — G894 Chronic pain syndrome: Secondary | ICD-10-CM | POA: Insufficient documentation

## 2023-06-19 DIAGNOSIS — R413 Other amnesia: Secondary | ICD-10-CM | POA: Insufficient documentation

## 2023-06-19 DIAGNOSIS — Z5181 Encounter for therapeutic drug level monitoring: Secondary | ICD-10-CM | POA: Insufficient documentation

## 2023-07-13 ENCOUNTER — Telehealth: Payer: Self-pay | Admitting: *Deleted

## 2023-07-13 MED ORDER — HYDROCODONE-ACETAMINOPHEN 5-325 MG PO TABS: 1.0000 | ORAL_TABLET | Freq: Three times a day (TID) | ORAL | 0 refills | Status: DC | PRN

## 2023-07-13 NOTE — Telephone Encounter (Signed)
 Shawn Mills says that [facility] does not take his insurance and his hydrocodone needs to be sent to [facility] on [address]  (I have put that pharmacy on his profile).  He is also asking about his appointment with Dr Wynn Banker?

## 2023-07-30 ENCOUNTER — Other Ambulatory Visit: Payer: Self-pay | Admitting: Physical Medicine and Rehabilitation

## 2023-07-30 ENCOUNTER — Encounter: Payer: 59 | Attending: Registered Nurse | Admitting: Physical Medicine and Rehabilitation

## 2023-07-30 VITALS — BP 118/77 | HR 94 | Ht 70.0 in | Wt 193.0 lb

## 2023-07-30 DIAGNOSIS — S069X1D Unspecified intracranial injury with loss of consciousness of 30 minutes or less, subsequent encounter: Secondary | ICD-10-CM

## 2023-07-30 DIAGNOSIS — M25551 Pain in right hip: Secondary | ICD-10-CM

## 2023-07-30 DIAGNOSIS — G8929 Other chronic pain: Secondary | ICD-10-CM | POA: Diagnosis not present

## 2023-07-30 DIAGNOSIS — R413 Other amnesia: Secondary | ICD-10-CM | POA: Diagnosis not present

## 2023-07-30 DIAGNOSIS — G894 Chronic pain syndrome: Secondary | ICD-10-CM

## 2023-07-30 DIAGNOSIS — M5442 Lumbago with sciatica, left side: Secondary | ICD-10-CM | POA: Insufficient documentation

## 2023-07-30 DIAGNOSIS — Z5181 Encounter for therapeutic drug level monitoring: Secondary | ICD-10-CM | POA: Diagnosis not present

## 2023-07-30 DIAGNOSIS — Z79891 Long term (current) use of opiate analgesic: Secondary | ICD-10-CM

## 2023-07-30 DIAGNOSIS — M5441 Lumbago with sciatica, right side: Secondary | ICD-10-CM | POA: Diagnosis not present

## 2023-07-30 MED ORDER — TRAZODONE HCL 50 MG PO TABS: 50.0000 mg | ORAL_TABLET | Freq: Every evening | ORAL | 3 refills | Status: DC | PRN

## 2023-07-30 MED ORDER — METHOCARBAMOL 750 MG PO TABS: 750.0000 mg | ORAL_TABLET | Freq: Three times a day (TID) | ORAL | 5 refills | Status: DC | PRN

## 2023-07-30 MED ORDER — MELOXICAM 15 MG PO TABS: 15.0000 mg | ORAL_TABLET | Freq: Every day | ORAL | 1 refills | Status: DC

## 2023-07-30 NOTE — Patient Instructions (Addendum)
Please follow up with Dr. Wynn Banker for flouroscopic guided R hip injection as soon as available. Please get xray done before this appointment.  Please get labs done and reschedule with Dr. Kieth Brightly for ongoing memory issues. Also, stop topomax.   For sleep, I am prescribing trazodone 50 mg nightly as needed. We can evaluate increasing this further next visit.  For your pain, continue Norco and get Meloxicam 15 mg daily to take for 2 weeks. At the end of 2 weeks, call or message me through mychart to let me know if this has had an effect; if not, we will transition your Norco over to [facility] patch with a gradual wean.  Please look into pro bono PT clinics; without attending these it is unlikely anyone will offer you a back injection. I am sending a referral to neurosurgery for your ongoing back pain and leg numbness/giving out.  Local [facility] PT clinics:  1) [facility] [email or web address] Clinic #: [phone]   2) [place] [facility]  [email or web address] Clinic #: [phone]  3) [facility] [email or web address] Clinic #: [phone]   You will not receive early refills or more medication if you do not work with our office on adjustments. This is a warning; any further noncompliance with our pain contract or recommendations, including things like PT and injections, may result in termination from the practice.   Follow up in 2 months

## 2023-07-30 NOTE — Progress Notes (Signed)
Subjective:    Patient ID: Shawn Mills, male    DOB: [birth year], 43 y.o.   MRN: [id number]  HPI   Shawn Mills is a 43 y.o. year old male  who  has a past medical history of Carpal tunnel syndrome, Kidney stones, and TBI (traumatic brain injury) (HCC).    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   Plan from last visit:  Chronic pain syndrome Encounter for therapeutic drug monitoring Encounter for long-term opiate analgesic use Continue Norco 5 mg as prescribed, last filled 11/8; since you are using it rarely, you can use up to 10 mg or 2 tabs at once, but must stay at prescribed maximum 4 tabs daily.    Follow up in 2 months   Right lumbar radiculitis - L5/S1 on exam Describing weakness and unremitting, burning and cramping pain in RLE. Had a neurosurgical referral for evaluation, unfortunately patient with significant anxiety/memory deficits and does not remember who or the details of this visit other than ordering MRI.    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 MRI unfortunately denied by insurance.    You will see Dr. Wynn Banker for an evaluation to determine if back injections (ESI) are a good option   Wean gabapentin down to 1 capsule (300 mg) three times daily for 2 weeks, then just 300 mg at night for one week, then stop.   Myofascial pain on right side Today I prescribed robaxin 750 mg three times daily for muscle spasms and re-prescribed lidocaine patches   Also continues to take baclofen 20 mg TID PRN; has not needed refilled   Call Local [facility] PT clinics to start PT for your low back and right leg. Call if you need a referral:   1) [facility] [email or web address] Clinic #: [phone]    2) [place] [facility]  [email or web address] Clinic #: [phone]   3) [facility] [email or web address] Clinic #: [phone]      Memory deficits -     Ambulatory referral to Neuropsychology   I referred you to Dr. Kieth Brightly for memory issues   Educated patient on  role of chronic pain fixation with memory consolidation difficulties   Encouraged use of phone alarms and reminders, since his GF is no longer present to coordinate his appointments/needs   Headaches Bilateral, stable on current dose of Topomax   Interval Hx:  - Therapies: Did not look into [facility] PT clinic; "after I left, it took me an hour to leave the parking lot because my back is so bad."    - Follow ups: Missed appointment for [facility] with Dr. Wynn Banker - had the wrong phone #.   "The one nurse said he was pretty backed up with appointments." This was actually probably Dr. Kieth Brightly with neuropsych - he says he had a family emergency that day.    - Falls: Had a fall a month ago - got out of bed and went to go use the restroom, turned right and his legs "gave out", he got lightheaded and couldn't get off the ground. He says his legs "quit" and he gets 20-30 minutes before they get strong enough for him  to stand again.     - DME: Occasional use of cane when his pain gets bad.    - Medications: Norco 5 mg QID PRN last filled 07/14/23. "I've been having to eat extra of them just to get up." He's taking 6-8 per day at this point.   Robaxin has helped a little bit. Off of gabapentin - did not make a difference. Started sertraline recently 25 mg - has been helpful for anxiety, GF notices he doesn't get nearly as aggitated. "He can slow down an  calm down and think about something." Topomax for headaches rarely.  Tylenol OTC when he runs out of norco; uses 6 a day when he needs to.  Smokes tobacco; denies alcohol or drug use.     - Other concerns: "My memory is shot"; GF with him today says "he doesn't recognize places we've been thousands of times". Trouble forming new memories and trouble recalling old memories.   Sometimes he loses his appetite due to pain.   He states he just got a verizon card for his phone; prior to this he had no access to internet, no way to make or receive calls.     Pain Inventory Average Pain 7 Pain Right Now 10 My pain is constant, sharp, stabbing, and aching  In the last 24 hours, has pain interfered with the following? General activity 5 Relation with others 0 Enjoyment of life 5 What TIME of day is your pain at its worst? morning , daytime, evening, and night Sleep (in general) Poor  Pain is worse with: walking, bending, sitting, standing, and some activites Pain improves with: rest and medication Relief from Meds: 3  No family history on file. Social History   Socioeconomic History   Marital status: Married    Spouse name: Not on file   Number of children: Not on file   Years of education: Not on file   Highest education level: Not on file  Occupational History   Not on file  Tobacco Use   Smoking status: Every Day    Types: Cigarettes   Smokeless tobacco: Not on file  Vaping Use   Vaping status: Never Used  Substance and Sexual Activity   Alcohol use: Not Currently   Drug use: No   Sexual activity: Yes    Birth control/protection: None  Other Topics Concern   Not on file  Social History Narrative   ** Merged History Encounter **       Social Drivers of Health   Financial Resource Strain: Not on file  Food Insecurity: Not on file  Transportation Needs: Not on file  Physical Activity: Not on file  Stress: Not on file  Social Connections: Not on file   Past  Surgical History:  Procedure Laterality Date   IR ANGIO INTRA EXTRACRAN SEL COM CAROTID INNOMINATE BILAT MOD SED  12/09/2022   IR ANGIO VERTEBRAL SEL VERTEBRAL BILAT MOD SED  12/09/2022   LITHOTRIPSY     Past Surgical History:  Procedure Laterality Date   IR ANGIO INTRA EXTRACRAN SEL COM CAROTID INNOMINATE BILAT MOD SED  12/09/2022   IR ANGIO VERTEBRAL SEL VERTEBRAL BILAT MOD SED  12/09/2022   LITHOTRIPSY     Past Medical History:  Diagnosis Date   Carpal tunnel syndrome    Kidney stones    TBI (traumatic brain injury) (HCC)    BP 118/77   Pulse 94   Ht 5\' 10"  (1.778 m)   Wt 193 lb (87.5 kg)  SpO2 96%   BMI 27.69 kg/m   Opioid Risk Score:   Fall Risk Score:  `1  Depression screen [facility] 2/9     07/30/2023   11:42 AM 05/30/2023   11:32 AM 02/28/2023   10:01 AM 01/31/2023    9:59 AM  Depression screen PHQ 2/9  Decreased Interest 0 1 1 0  Down, Depressed, Hopeless 0 1 1 0  PHQ - 2 Score 0 2 2 0  Altered sleeping    1  Tired, decreased energy    2  Change in appetite    0  Feeling bad or failure about yourself     2  Trouble concentrating    0  Moving slowly or fidgety/restless    1  Suicidal thoughts    0  PHQ-9 Score    6      Review of Systems  Musculoskeletal:  Positive for back pain and gait problem.  All other systems reviewed and are negative.     Objective:   Physical Exam   PE: Constitution: Appropriate appearance for age. No apparent distress  Resp: No respiratory distress. No accessory muscle usage. on RA and CTAB Cardio: Well perfused appearance. No peripheral edema. Abdomen: Nondistended. Nontender.   Psych: Anxious but appropriate Neuro: AAOx4. + Memory deficits - both short and long term, including attention difficulty. Remembers 0/3 objects at 5 minutes.    Neurologic Exam:   DTRs: Reflexes were 2+ in bilateral achilles, patella, biceps, BR and triceps. Babinsky: flexor responses b/l.   Hoffmans: negative b/l Sensory exam: revealed normal  sensation in all dermatomal regions in bilateral upper extremities, left lower extremity, and with reduced sensation to light touch in right 5th digit and right lateral knee and extending to the low back Motor exam: strength 5/5 throughout bilateral upper extremities and bilateral lower extremities Coordination: Fine motor coordination was normal.   Gait: +antalgic gait favoring left foot; no trendelenburg. No assistive device.    MSK: + TTP throughout R thoracic and lumbar paraspinals, R flank. + Pain with facet loading, with pain down left posterior thigh  + GROIN aching/stabbing with external hip rotation, FABER, FADIR, and compression tests.         Assessment & Plan:   Shawn Mills is a 43 y.o. year old male  who  has a past medical history of Carpal tunnel syndrome, Kidney stones, and TBI (traumatic brain injury) (HCC).   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  Chronic pain syndrome Encounter for therapeutic drug monitoring Encounter for long-term opiate analgesic use Chronic right-sided low back pain with bilateral sciatica -     Ambulatory referral to Neurosurgery  Pain from low back with sensory deficits in RLE consistent with L5-S1 radiculopathy, along with recent falls from sudden RLE weakness. CT lumbar spine 04/2023 showed mild degenerative changes at L4/5 and L5/S1. Attempted to get ESI with Dr. Wynn Banker but missed appointment due to not having a phone to return calls; at this point wishes for neurosurgical referral to get ESI and coordinate surgical intervention if needed given ongoing severity of symptoms.   For your pain, continue Norco and get Meloxicam 15 mg daily to take for 2 weeks. At the end of 2 weeks, call or message me through mychart to let me know if this has had an effect; if not, we will  transition your Norco over to [employer] patch with a gradual wean.  Please look into pro bono PT clinics; without attending these it is unlikely anyone will offer you a back injection.  Local [facility] PT clinics:  1) [facility] [email or web address] Clinic #: [phone]   2) [place] [facility]  [email or web address] Clinic #: [phone]  3) [facility] [email or web address] Clinic #: [phone]   You will not receive early refills or more medication if you do not work with our office on adjustments. This is a warning; any further noncompliance with our pain contract or recommendations, including things like PT and injections, may result in termination from the practice.   Follow up in 2 months  Traumatic brain injury, with loss of consciousness of 30 minutes or less, subsequent encounter Memory deficits -     VITAMIN D 25 Hydroxy (Vit-D Deficiency, Fractures); Future -     B12 and Folate Panel -     TSH + free T4; Future  Please get labs done and reschedule with Dr. Kieth Brightly for ongoing memory issues. Also, stop topomax.   For sleep, I am prescribing trazodone 50 mg nightly as needed. We can evaluate increasing this further next visit.  Continue Sertraline   Right hip pain -     DG HIP UNILAT W OR W/O PELVIS 2-3 VIEWS RIGHT; Future  Notable R groin pain with SI joint maneuvers on exam today; ? R hip impingement syndrome.   Please follow up with Dr. Wynn Banker for flouroscopic guided R hip injection as soon as available. Please get xray done before this appointment.   Other orders -     Methocarbamol; Take 1 tablet (750 mg total) by mouth every 8 (eight) hours as needed for muscle spasms.  Dispense: 90 tablet; Refill: 5 -     traZODone  HCl; Take 1 tablet (50 mg total) by mouth at bedtime as needed for sleep.  Dispense: 30 tablet; Refill: 3 -     Meloxicam; Take 1 tablet (15 mg total) by mouth daily.  Dispense: 14 tablet; Refill: 1

## 2023-07-31 MED ORDER — HYDROCODONE-ACETAMINOPHEN 5-325 MG PO TABS: 1.0000 | ORAL_TABLET | Freq: Three times a day (TID) | ORAL | 0 refills | Status: AC | PRN

## 2023-08-01 ENCOUNTER — Encounter: Payer: Self-pay | Admitting: Physical Medicine and Rehabilitation

## 2023-08-30 ENCOUNTER — Encounter: Payer: 59 | Admitting: Physical Medicine & Rehabilitation

## 2023-09-05 ENCOUNTER — Encounter: Payer: Self-pay | Admitting: Physical Medicine and Rehabilitation

## 2023-09-26 ENCOUNTER — Encounter: Payer: 59 | Attending: Registered Nurse | Admitting: Physical Medicine and Rehabilitation

## 2023-09-26 DIAGNOSIS — M25551 Pain in right hip: Secondary | ICD-10-CM | POA: Insufficient documentation

## 2023-09-26 DIAGNOSIS — M5442 Lumbago with sciatica, left side: Secondary | ICD-10-CM | POA: Insufficient documentation

## 2023-09-26 DIAGNOSIS — S069X1D Unspecified intracranial injury with loss of consciousness of 30 minutes or less, subsequent encounter: Secondary | ICD-10-CM | POA: Insufficient documentation

## 2023-09-26 DIAGNOSIS — G894 Chronic pain syndrome: Secondary | ICD-10-CM | POA: Insufficient documentation

## 2023-09-26 DIAGNOSIS — Z5181 Encounter for therapeutic drug level monitoring: Secondary | ICD-10-CM | POA: Insufficient documentation

## 2023-09-26 DIAGNOSIS — R413 Other amnesia: Secondary | ICD-10-CM | POA: Insufficient documentation

## 2023-09-26 DIAGNOSIS — G8929 Other chronic pain: Secondary | ICD-10-CM | POA: Insufficient documentation

## 2023-09-26 DIAGNOSIS — M5441 Lumbago with sciatica, right side: Secondary | ICD-10-CM | POA: Insufficient documentation

## 2023-09-26 DIAGNOSIS — Z79891 Long term (current) use of opiate analgesic: Secondary | ICD-10-CM | POA: Insufficient documentation

## 2023-09-28 ENCOUNTER — Encounter: Payer: 59 | Admitting: Physical Medicine & Rehabilitation

## 2023-10-18 ENCOUNTER — Other Ambulatory Visit: Payer: Self-pay

## 2023-10-18 ENCOUNTER — Emergency Department (HOSPITAL_COMMUNITY)

## 2023-10-18 ENCOUNTER — Emergency Department (HOSPITAL_COMMUNITY)
Admission: EM | Admit: 2023-10-18 | Discharge: 2023-10-18 | Disposition: A | Discharge status: Home / Self Care | Attending: Emergency Medicine | Admitting: Emergency Medicine

## 2023-10-18 ENCOUNTER — Encounter (HOSPITAL_COMMUNITY): Payer: Self-pay

## 2023-10-18 DIAGNOSIS — Z7982 Long term (current) use of aspirin: Secondary | ICD-10-CM | POA: Insufficient documentation

## 2023-10-18 DIAGNOSIS — R569 Unspecified convulsions: Secondary | ICD-10-CM | POA: Insufficient documentation

## 2023-10-18 DIAGNOSIS — R519 Headache, unspecified: Secondary | ICD-10-CM | POA: Diagnosis not present

## 2023-10-18 DIAGNOSIS — M542 Cervicalgia: Secondary | ICD-10-CM | POA: Diagnosis not present

## 2023-10-18 DIAGNOSIS — M549 Dorsalgia, unspecified: Secondary | ICD-10-CM | POA: Diagnosis not present

## 2023-10-18 LAB — CBC WITH DIFFERENTIAL/PLATELET
Abs Immature Granulocytes: 0.11 10*3/uL — ABNORMAL HIGH (ref 0.00–0.07)
Basophils Absolute: 0 10*3/uL (ref 0.0–0.1)
Basophils Relative: 0 %
Eosinophils Absolute: 0.2 10*3/uL (ref 0.0–0.5)
Eosinophils Relative: 1 %
HCT: 42.9 % (ref 39.0–52.0)
Hemoglobin: 15.1 g/dL (ref 13.0–17.0)
Immature Granulocytes: 1 %
Lymphocytes Relative: 19 %
Lymphs Abs: 3 10*3/uL (ref 0.7–4.0)
MCH: 34.3 pg — ABNORMAL HIGH (ref 26.0–34.0)
MCHC: 35.2 g/dL (ref 30.0–36.0)
MCV: 97.5 fL (ref 80.0–100.0)
Monocytes Absolute: 0.9 10*3/uL (ref 0.1–1.0)
Monocytes Relative: 6 %
Neutro Abs: 11.1 10*3/uL — ABNORMAL HIGH (ref 1.7–7.7)
Neutrophils Relative %: 73 %
Platelets: 421 10*3/uL — ABNORMAL HIGH (ref 150–400)
RBC: 4.4 MIL/uL (ref 4.22–5.81)
RDW: 13.9 % (ref 11.5–15.5)
WBC: 15.2 10*3/uL — ABNORMAL HIGH (ref 4.0–10.5)
nRBC: 0 % (ref 0.0–0.2)

## 2023-10-18 LAB — RAPID URINE DRUG SCREEN, HOSP PERFORMED
Amphetamines: NOT DETECTED
Barbiturates: NOT DETECTED
Benzodiazepines: NOT DETECTED
Cocaine: NOT DETECTED
Opiates: NOT DETECTED
Tetrahydrocannabinol: NOT DETECTED

## 2023-10-18 LAB — URINALYSIS, ROUTINE W REFLEX MICROSCOPIC
Bilirubin Urine: NEGATIVE
Glucose, UA: NEGATIVE mg/dL
Hgb urine dipstick: NEGATIVE
Ketones, ur: NEGATIVE mg/dL
Leukocytes,Ua: NEGATIVE
Nitrite: NEGATIVE
Protein, ur: NEGATIVE mg/dL
Specific Gravity, Urine: 1.001 — ABNORMAL LOW (ref 1.005–1.030)
pH: 6 (ref 5.0–8.0)

## 2023-10-18 LAB — COMPREHENSIVE METABOLIC PANEL WITH GFR
ALT: 22 U/L (ref 0–44)
AST: 30 U/L (ref 15–41)
Albumin: 3.4 g/dL — ABNORMAL LOW (ref 3.5–5.0)
Alkaline Phosphatase: 106 U/L (ref 38–126)
Anion gap: 13 (ref 5–15)
BUN: <5 mg/dL — ABNORMAL LOW (ref 6–20)
CO2: 23 mmol/L (ref 22–32)
Calcium: 9.1 mg/dL (ref 8.9–10.3)
Chloride: 99 mmol/L (ref 98–111)
Creatinine, Ser: 0.9 mg/dL (ref 0.61–1.24)
GFR, Estimated: >60 mL/min (ref >60)
Glucose, Bld: 123 mg/dL — ABNORMAL HIGH (ref 70–99)
Potassium: 3.3 mmol/L — ABNORMAL LOW (ref 3.5–5.1)
Sodium: 135 mmol/L (ref 135–145)
Total Bilirubin: 0.7 mg/dL (ref 0.0–1.2)
Total Protein: 6.7 g/dL (ref 6.5–8.1)

## 2023-10-18 LAB — ETHANOL: Alcohol, Ethyl (B): <10 mg/dL (ref <10)

## 2023-10-18 MED ORDER — SODIUM CHLORIDE 0.9 % IV SOLN: INTRAVENOUS | Status: DC

## 2023-10-18 MED ORDER — LORAZEPAM 2 MG/ML IJ SOLN
1.0000 mg | Freq: Once | INTRAMUSCULAR | Status: AC
  Administered 2023-10-18: 1 mg via INTRAVENOUS
  Filled 2023-10-18: qty 1

## 2023-10-18 MED ORDER — SODIUM CHLORIDE 0.9 % IV BOLUS
1000.0000 mL | Freq: Once | INTRAVENOUS | Status: AC
  Administered 2023-10-18: 1000 mL via INTRAVENOUS

## 2023-10-18 MED ORDER — FENTANYL CITRATE PF 50 MCG/ML IJ SOSY
PREFILLED_SYRINGE | INTRAMUSCULAR | Status: AC
  Administered 2023-10-18: 50 ug via INTRAVENOUS
  Filled 2023-10-18: qty 1

## 2023-10-18 MED ORDER — FENTANYL CITRATE PF 50 MCG/ML IJ SOSY: 50.0000 ug | PREFILLED_SYRINGE | Freq: Once | INTRAMUSCULAR | Status: AC

## 2023-10-18 MED ORDER — LEVETIRACETAM 500 MG PO TABS: 500.0000 mg | ORAL_TABLET | Freq: Two times a day (BID) | ORAL | 0 refills | Status: DC

## 2023-10-18 MED ORDER — LEVETIRACETAM IN NACL 1000 MG/100ML IV SOLN
1000.0000 mg | Freq: Two times a day (BID) | INTRAVENOUS | Status: DC
  Administered 2023-10-18: 1000 mg via INTRAVENOUS
  Filled 2023-10-18: qty 100

## 2023-10-18 NOTE — ED Triage Notes (Signed)
 BIB EMS new onset seizure that last a min.  Reports frequent increased falls x 1 month.  No oral injury but was incontinent.  Patient has hx of TBI did falll hit head during seizure complaining of headache and  back pain.  C collar in place.  BP140/73 HR 114 95%RA CBG 125  18gRAC 4mg  zofran given

## 2023-10-18 NOTE — ED Provider Notes (Signed)
 [place] EMERGENCY DEPARTMENT AT [facility] Provider Note   CSN: [id number] Arrival date & time: 10/18/23  2001     History  Chief Complaint  Patient presents with   Seizures    Shawn Mills is a 43 y.o. male.  HPI Patient presents after his seizure.  He arrives via EMS.  History is obtained by those individuals, and the patient who is now awake and alert.  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  Patient states that he takes no medication regularly though he was supposed to see his physician and discussed this.  That visit was interrupted by a trip to incarceration.  Today patient had an episode of witnessed seizure-like activity lasting about 1 minute with postictal phase lasting some time until just prior to ED arrival.  Patient describes head pain, neck pain, back pain though he notes that back pain is chronic.  No new weakness in any extremity.  Patient drinks sometimes.    Home Medications Prior to Admission medications   Medication Sig Start Date End Date Taking? Authorizing Provider  levETIRAcetam (KEPPRA) 500 MG tablet Take 1 tablet (500 mg total) by mouth 2 (two) times daily. 10/18/23  Yes Gerhard Munch, MD  aspirin 81 MG chewable tablet Chew 1 tablet (81 mg total) by mouth daily. 01/22/23   Angiulli, Mcarthur Rossetti, PA-C  diclofenac Sodium (VOLTAREN) 1 % GEL Apply 4 g topically 2 (two) times daily. 01/22/23   Angiulli, Mcarthur Rossetti, PA-C  folic acid (FOLVITE) 1 MG tablet Take 1 tablet (1 mg total) by mouth daily. 01/22/23   Angiulli, Mcarthur Rossetti, PA-C  lidocaine (LIDODERM) 5 % Place 2 patches onto the skin daily. Remove & Discard patch within 12 hours or as directed by MD 05/30/23   Angelina Sheriff, DO  meloxicam (MOBIC) 15 MG tablet Take 1 tablet (15 mg total) by mouth daily. 07/30/23   Angelina Sheriff, DO  methocarbamol (ROBAXIN-750) 750 MG tablet Take 1 tablet (750 mg total) by mouth every 8 (eight) hours as needed for  muscle spasms. 07/30/23   Angelina Sheriff, DO  Multiple Vitamin (MULTIVITAMIN WITH MINERALS) TABS tablet Take 1 tablet by mouth daily. 01/22/23   Angiulli, Mcarthur Rossetti, PA-C  traZODone (DESYREL) 50 MG tablet Take 1 tablet (50 mg total) by mouth at bedtime as needed for sleep. 07/30/23   Angelina Sheriff, DO      Allergies    Patient has no known allergies.    Review of Systems   Review of Systems  Physical Exam Updated Vital Signs BP 113/81   Pulse 100   Temp 98.1 F (36.7 C) (Oral)   Resp (!) 23   Ht 5\' 10"  (1.778 m)   Wt 87.5 kg   SpO2 97%   BMI 27.69 kg/m  Physical Exam Vitals and nursing note reviewed.  Constitutional:      General: He is not in acute distress.    Appearance: He is well-developed.     Comments: Adult male cervical collar in place awake and alert speaking clearly  HENT:     Head: Normocephalic and atraumatic.  Eyes:     Conjunctiva/sclera: Conjunctivae normal.  Cardiovascular:     Rate and Rhythm: Regular rhythm. Tachycardia present.  Pulmonary:     Effort: Pulmonary effort is normal. No respiratory distress.     Breath sounds: No stridor.  Abdominal:     General: There is  no distension.  Skin:    General: Skin is warm and dry.  Neurological:     General: No focal deficit present.     Mental Status: He is alert and oriented to person, place, and time.     ED Results / Procedures / Treatments   Labs (all labs ordered are listed, but only abnormal results are displayed) Labs Reviewed  COMPREHENSIVE METABOLIC PANEL WITH GFR - Abnormal; Notable for the following components:      Result Value   Potassium 3.3 (*)    Glucose, Bld 123 (*)    BUN <5 (*)    Albumin 3.4 (*)    All other components within normal limits  CBC WITH DIFFERENTIAL/PLATELET - Abnormal; Notable for the following components:   WBC 15.2 (*)    MCH 34.3 (*)    Platelets 421 (*)    Neutro Abs 11.1 (*)    Abs Immature Granulocytes 0.11 (*)    All other components within normal  limits  URINALYSIS, ROUTINE W REFLEX MICROSCOPIC - Abnormal; Notable for the following components:   Color, Urine STRAW (*)    Specific Gravity, Urine 1.001 (*)    All other components within normal limits  ETHANOL  RAPID URINE DRUG SCREEN, HOSP PERFORMED    EKG EKG Interpretation Date/Time:  Thursday October 18 2023 20:05:15 EDT Ventricular Rate:  114 PR Interval:  144 QRS Duration:  91 QT Interval:  340 QTC Calculation: 469 R Axis:   101  Text Interpretation: Sinus tachycardia Right axis deviation Confirmed by Gerhard Munch [id number]) on 10/18/2023 8:15:03 PM  Radiology CT CERVICAL SPINE WO CONTRAST Result Date: 10/18/2023 CLINICAL DATA:  New onset seizures, fall. EXAM: CT HEAD WITHOUT CONTRAST CT CERVICAL SPINE WITHOUT CONTRAST TECHNIQUE: Multidetector CT imaging of the head and cervical spine was performed following the standard protocol without intravenous contrast. Multiplanar CT image reconstructions of the cervical spine were also generated. RADIATION DOSE REDUCTION: This exam was performed according to the departmental dose-optimization program which includes automated exposure control, adjustment of the mA and/or kV according to patient size and/or use of iterative reconstruction technique. COMPARISON:  CT head 01/05/2023.  CT cervical spine 12/08/2022. FINDINGS: CT HEAD FINDINGS Brain: No acute intracranial hemorrhage. No CT evidence of acute infarct. Similar appearance of encephalomalacia in the anterior inferior left frontal lobe along the anterior skull base as well as in the anterior left temporal lobe suggestive of prior trauma. Previously noted left posterior subdural hematoma is not appreciated on the current study. No extra-axial fluid collections. No edema, mass effect, or midline shift. The basilar cisterns are patent. Ventricles: The ventricles are normal. Vascular: No hyperdense vessel or unexpected calcification. Skull: No acute or aggressive finding. Orbits: Orbits are  symmetric. Sinuses: Mild mucosal thickening in the ethmoid sinuses and left sphenoid sinus. Mucous retention cyst in the right maxillary sinus. Other: Mastoid air cells are clear. Subtle soft tissue thickening within the right parietal scalp at the site of prior scalp hematoma, significantly decreased from prior. CT CERVICAL SPINE FINDINGS Alignment: Alignment is maintained. No listhesis. No facet subluxation or dislocation. Skull base and vertebrae: No compression fracture or displaced fracture in the cervical spine. No suspicious osseous lesion. Soft tissues and spinal canal: No prevertebral fluid or swelling. No visible canal hematoma. Multiple subcentimeter bilateral cervical lymph nodes are similar to prior. Disc levels: Intervertebral disc spaces are relatively maintained. Small disc bulges at multiple levels. No high-grade osseous spinal canal stenosis. No severe facet overgrowth or high-grade foraminal stenosis.  Upper chest: Negative. Other: None. IMPRESSION: No CT evidence of acute intracranial abnormality. No acute fracture or traumatic malalignment of the cervical spine. 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 Electronically Signed   By: Emily Filbert M.D.   On: 10/18/2023 22:21   CT HEAD WO CONTRAST ( ) Result Date: 10/18/2023 CLINICAL DATA:  New onset seizures, fall. EXAM: CT HEAD WITHOUT CONTRAST CT CERVICAL SPINE WITHOUT CONTRAST TECHNIQUE: Multidetector CT imaging of the head and cervical spine was performed following the standard protocol without intravenous contrast. Multiplanar CT image reconstructions of the cervical spine were also generated. RADIATION DOSE REDUCTION: This exam was performed according to the departmental dose-optimization program which includes automated exposure control, adjustment of the mA and/or kV according to patient size and/or use of iterative reconstruction technique. COMPARISON:  CT head 01/05/2023.  CT  cervical spine 12/08/2022. FINDINGS: CT HEAD FINDINGS Brain: No acute intracranial hemorrhage. No CT evidence of acute infarct. Similar appearance of encephalomalacia in the anterior inferior left frontal lobe along the anterior skull base as well as in the anterior left temporal lobe suggestive of prior trauma. Previously noted left posterior subdural hematoma is not appreciated on the current study. No extra-axial fluid collections. No edema, mass effect, or midline shift. The basilar cisterns are patent. Ventricles: The ventricles are normal. Vascular: No hyperdense vessel or unexpected calcification. Skull: No acute or aggressive finding. Orbits: Orbits are symmetric. Sinuses: Mild mucosal thickening in the ethmoid sinuses and left sphenoid sinus. Mucous retention cyst in the right maxillary sinus. Other: Mastoid air cells are clear. Subtle soft tissue thickening within the right parietal scalp at the site of prior scalp hematoma, significantly decreased from prior. CT CERVICAL SPINE FINDINGS Alignment: Alignment is maintained. No listhesis. No facet subluxation or dislocation. Skull base and vertebrae: No compression fracture or displaced fracture in the cervical spine. No suspicious osseous lesion. Soft tissues and spinal canal: No prevertebral fluid or swelling. No visible canal hematoma. Multiple subcentimeter bilateral cervical lymph nodes are similar to prior. Disc levels: Intervertebral disc spaces are relatively maintained. Small disc bulges at multiple levels. No high-grade osseous spinal canal stenosis. No severe facet overgrowth or high-grade foraminal stenosis. Upper chest: Negative. Other: None. IMPRESSION: No CT evidence of acute intracranial abnormality. No acute fracture or traumatic malalignment of the cervical spine. 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 Electronically Signed   By: Emily Filbert M.D.   On: 10/18/2023 22:21     Procedures Procedures    Medications Ordered in ED Medications  sodium chloride 0.9 % bolus 1,000 mL (0 mLs Intravenous Stopped 10/18/23 2258)    And  0.9 %  sodium chloride infusion ( Intravenous New Bag/Given 10/18/23 2125)  levETIRAcetam (KEPPRA) IVPB 1000 mg/100 mL premix (0 mg Intravenous Stopped 10/18/23 2144)  sodium chloride 0.9 % bolus 1,000 mL (0 mLs Intravenous Stopped 10/18/23 2144)  LORazepam (ATIVAN) injection 1 mg (1 mg Intravenous Given 10/18/23 2028)  fentaNYL (SUBLIMAZE) injection 50 mcg (50 mcg Intravenous Given 10/18/23 2210)    ED Course/ Medical Decision Making/ A&P                                 Medical Decision Making Patient with a history of prior TBI, ongoing alcohol use presents after witnessed seizure.  First seizure activity, with differential for intracranial abnormality including mass, trauma, alcohol intoxication versus  withdrawal.  Patient is tachycardic, but not hypotensive, low suspicion for infection, meningitis.  Cardiac 115 sinus tach abnormal pulse ox 96% room air normal  Amount and/or Complexity of Data Reviewed Independent Historian: EMS External Data Reviewed: notes. Labs: ordered. Decision-making details documented in ED Course. Radiology: ordered and independent interpretation performed. Decision-making details documented in ED Course. ECG/medicine tests: ordered and independent interpretation performed. Decision-making details documented in ED Course.  Risk Prescription drug management. Decision regarding hospitalization. Diagnosis or treatment significantly limited by social determinants of health.   11:14 PM Patient awake, alert, cervical collar removed, no new pain, no neurodeficits.  Patient has minimal tachycardia, otherwise states that he feels better.  I discussed the patient's case with our neurology team given his history of TBI, consideration of seizures, and patient has had a loading of Keppra, no additional seizure  activity here.  Patient will start Keppra, twice daily, follow-up with primary care.        Final Clinical Impression(s) / ED Diagnoses Final diagnoses:  Seizure [facility])    Rx / DC Orders ED Discharge Orders          Ordered    levETIRAcetam (KEPPRA) 500 MG tablet  2 times daily        10/18/23 2313              Gerhard Munch, MD 10/18/23 2314

## 2023-10-18 NOTE — Discharge Instructions (Addendum)
 As discussed, your evaluation tonight has been consistent with seizure disorder.  Please be sure to obtain and take your prescribed medication and follow-up with your physician.  Return here for concerning changes in your condition.

## 2023-10-18 NOTE — ED Notes (Signed)
 C collar removed

## 2023-11-10 ENCOUNTER — Encounter (HOSPITAL_COMMUNITY): Payer: Self-pay | Admitting: *Deleted

## 2023-11-10 ENCOUNTER — Emergency Department (HOSPITAL_COMMUNITY)
Admission: EM | Admit: 2023-11-10 | Discharge: 2023-11-11 | Disposition: A | Discharge status: Home / Self Care | Attending: Emergency Medicine | Admitting: Emergency Medicine

## 2023-11-10 ENCOUNTER — Other Ambulatory Visit: Payer: Self-pay

## 2023-11-10 DIAGNOSIS — R0789 Other chest pain: Secondary | ICD-10-CM | POA: Diagnosis present

## 2023-11-10 DIAGNOSIS — X16XXXA Contact with hot heating appliances, radiators and pipes, initial encounter: Secondary | ICD-10-CM | POA: Insufficient documentation

## 2023-11-10 DIAGNOSIS — Y908 Blood alcohol level of 240 mg/100 ml or more: Secondary | ICD-10-CM | POA: Diagnosis not present

## 2023-11-10 DIAGNOSIS — S22088A Other fracture of T11-T12 vertebra, initial encounter for closed fracture: Secondary | ICD-10-CM | POA: Diagnosis not present

## 2023-11-10 DIAGNOSIS — F10129 Alcohol abuse with intoxication, unspecified: Secondary | ICD-10-CM | POA: Diagnosis not present

## 2023-11-10 DIAGNOSIS — R569 Unspecified convulsions: Secondary | ICD-10-CM | POA: Insufficient documentation

## 2023-11-10 DIAGNOSIS — F10920 Alcohol use, unspecified with intoxication, uncomplicated: Secondary | ICD-10-CM

## 2023-11-10 DIAGNOSIS — S2222XA Fracture of body of sternum, initial encounter for closed fracture: Secondary | ICD-10-CM | POA: Diagnosis not present

## 2023-11-10 DIAGNOSIS — Z7982 Long term (current) use of aspirin: Secondary | ICD-10-CM | POA: Insufficient documentation

## 2023-11-10 DIAGNOSIS — R55 Syncope and collapse: Secondary | ICD-10-CM

## 2023-11-10 NOTE — ED Triage Notes (Signed)
 The pt was here 2 weeks ago for a very bad seizure and he has struck his chest during this time and since then he has been having chest pain worse for 2 days

## 2023-11-10 NOTE — ED Triage Notes (Signed)
 The pt also had a seizure earlier tonight

## 2023-11-11 ENCOUNTER — Emergency Department (HOSPITAL_COMMUNITY)

## 2023-11-11 LAB — HEPATIC FUNCTION PANEL
ALT: 67 U/L — ABNORMAL HIGH (ref 0–44)
AST: 82 U/L — ABNORMAL HIGH (ref 15–41)
Albumin: 3.6 g/dL (ref 3.5–5.0)
Alkaline Phosphatase: 162 U/L — ABNORMAL HIGH (ref 38–126)
Bilirubin, Direct: 0.1 mg/dL (ref 0.0–0.2)
Indirect Bilirubin: 0.6 mg/dL (ref 0.3–0.9)
Total Bilirubin: 0.7 mg/dL (ref 0.0–1.2)
Total Protein: 7.7 g/dL (ref 6.5–8.1)

## 2023-11-11 LAB — BASIC METABOLIC PANEL WITH GFR
Anion gap: 13 (ref 5–15)
BUN: <5 mg/dL — ABNORMAL LOW (ref 6–20)
CO2: 24 mmol/L (ref 22–32)
Calcium: 8.7 mg/dL — ABNORMAL LOW (ref 8.9–10.3)
Chloride: 98 mmol/L (ref 98–111)
Creatinine, Ser: 0.8 mg/dL (ref 0.61–1.24)
GFR, Estimated: >60 mL/min (ref >60)
Glucose, Bld: 102 mg/dL — ABNORMAL HIGH (ref 70–99)
Potassium: 3.6 mmol/L (ref 3.5–5.1)
Sodium: 135 mmol/L (ref 135–145)

## 2023-11-11 LAB — CBC
HCT: 51.1 % (ref 39.0–52.0)
Hemoglobin: 18 g/dL — ABNORMAL HIGH (ref 13.0–17.0)
MCH: 35.2 pg — ABNORMAL HIGH (ref 26.0–34.0)
MCHC: 35.2 g/dL (ref 30.0–36.0)
MCV: 100 fL (ref 80.0–100.0)
Platelets: 363 10*3/uL (ref 150–400)
RBC: 5.11 MIL/uL (ref 4.22–5.81)
RDW: 15.4 % (ref 11.5–15.5)
WBC: 8.8 10*3/uL (ref 4.0–10.5)
nRBC: 0 % (ref 0.0–0.2)

## 2023-11-11 LAB — TROPONIN I (HIGH SENSITIVITY)
Troponin I (High Sensitivity): 3 ng/L (ref <18)
Troponin I (High Sensitivity): 3 ng/L (ref <18)

## 2023-11-11 LAB — ETHANOL: Alcohol, Ethyl (B): 307 mg/dL (ref <15)

## 2023-11-11 LAB — MAGNESIUM: Magnesium: 2.4 mg/dL (ref 1.7–2.4)

## 2023-11-11 MED ORDER — KETOROLAC TROMETHAMINE 15 MG/ML IJ SOLN
15.0000 mg | Freq: Once | INTRAMUSCULAR | Status: AC
  Administered 2023-11-11: 15 mg via INTRAVENOUS
  Filled 2023-11-11: qty 1

## 2023-11-11 MED ORDER — OXYCODONE-ACETAMINOPHEN 5-325 MG PO TABS
1.0000 | ORAL_TABLET | Freq: Once | ORAL | Status: AC
  Administered 2023-11-11: 1 via ORAL
  Filled 2023-11-11: qty 1

## 2023-11-11 MED ORDER — IOHEXOL 350 MG/ML SOLN
75.0000 mL | Freq: Once | INTRAVENOUS | Status: AC | PRN
  Administered 2023-11-11: 75 mL via INTRAVENOUS

## 2023-11-11 MED ORDER — LIDOCAINE 5 % EX PTCH: 1.0000 | MEDICATED_PATCH | CUTANEOUS | 0 refills | Status: AC

## 2023-11-11 MED ORDER — LIDOCAINE 5 % EX PTCH
1.0000 | MEDICATED_PATCH | CUTANEOUS | Status: DC
  Administered 2023-11-11: 1 via TRANSDERMAL
  Filled 2023-11-11: qty 1

## 2023-11-11 MED ORDER — LEVETIRACETAM 500 MG PO TABS: 500.0000 mg | ORAL_TABLET | Freq: Two times a day (BID) | ORAL | 1 refills | Status: DC

## 2023-11-11 MED ORDER — OXYCODONE HCL 5 MG PO TABS: 5.0000 mg | ORAL_TABLET | Freq: Four times a day (QID) | ORAL | 0 refills | Status: DC | PRN

## 2023-11-11 MED ORDER — LEVETIRACETAM 500 MG PO TABS
1000.0000 mg | ORAL_TABLET | Freq: Once | ORAL | Status: AC
  Administered 2023-11-11: 1000 mg via ORAL
  Filled 2023-11-11: qty 2

## 2023-11-11 NOTE — ED Notes (Signed)
 Called lab & they're going to add on the hepatic function and mag.

## 2023-11-11 NOTE — ED Notes (Signed)
 Pt's mother provided a ride home upon discharge.

## 2023-11-11 NOTE — ED Notes (Signed)
 Pt also reports having a seizure tonight. +incontinence. Reports was supposed to be taking prescribed meds from a month ago but didn't get them filled due to cost.

## 2023-11-11 NOTE — ED Notes (Signed)
 Pt given some water to drink Also given an incentive spirometer with instructions for use. Verbalizes understanding.

## 2023-11-11 NOTE — ED Notes (Addendum)
 Pt back from CT.  Placed back on the monitor. Encouraged to stay in the bed and not move around in the room - verbalizes understanding. Bed in low lying/locked position.   No further needs right now.

## 2023-11-11 NOTE — ED Notes (Signed)
 Pt taken to CT.

## 2023-11-11 NOTE — ED Provider Notes (Signed)
 [place] EMERGENCY DEPARTMENT AT [facility] Provider Note   CSN: [id number] Arrival date & time: 11/10/23  2325     History  Chief Complaint  Patient presents with   Chest Pain    Shawn Mills is a 43 y.o. male.  The history is provided by the patient.  Chest Pain Shawn Mills is a 43 y.o. male who presents to the Emergency Department complaining of seizure and chest pain.  He presents to the emergency department accompanied by his parents for evaluation of possible seizure as well as chest pain.  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  He states when he had the seizure he landed on a heater with his chest and has been experiencing central chest pain since then with pain on inspiration.  He was unable to fill the Keppra  that he was prescribed due to financial constraints.  Tonight he was at home and he was face having a friend when he passed out.  He believes he had another seizure.  He did have bladder incontinence.  This was not a witnessed seizure.  He does not have any fevers.  He does vomit every morning for the last 2 weeks.  No diarrhea.  He states that he does not drink often and had 1 drink tonight.  He states that his last drink was at a minimum several weeks ago.  Mother was interviewed separately and she states that he drinks heavily every night.  She does report that he was walking a little bit off from his baseline gait, which may have been secondary to intoxication.  That occurred just prior to being notified by his friend that he passed out.      Home Medications Prior to Admission medications   Medication Sig Start Date End Date Taking? Authorizing Provider  lidocaine  (LIDODERM ) 5 % Place 1 patch onto the skin daily. Remove & Discard patch within 12 hours or as directed by MD 11/11/23  Yes Kelsey Patricia, MD  oxyCODONE  (ROXICODONE ) 5 MG immediate release  tablet Take 1 tablet (5 mg total) by mouth every 6 (six) hours as needed for severe pain (pain score 7-10). 11/11/23  Yes Kelsey Patricia, MD  aspirin  81 MG chewable tablet Chew 1 tablet (81 mg total) by mouth daily. 01/22/23   Angiulli, Everlyn Hockey, PA-C  diclofenac  Sodium (VOLTAREN ) 1 % GEL Apply 4 g topically 2 (two) times daily. 01/22/23   Angiulli, Everlyn Hockey, PA-C  folic acid  (FOLVITE ) 1 MG tablet Take 1 tablet (1 mg total) by mouth daily. 01/22/23   Angiulli, Everlyn Hockey, PA-C  levETIRAcetam  (KEPPRA ) 500 MG tablet Take 1 tablet (500 mg total) by mouth 2 (two) times daily. 11/11/23   Kelsey Patricia, MD  meloxicam  (MOBIC ) 15 MG tablet Take 1 tablet (15 mg total) by mouth daily. 07/30/23   Bea Lime, DO  methocarbamol  (ROBAXIN -750) 750 MG tablet Take 1 tablet (750 mg total) by mouth every 8 (eight) hours as needed for muscle spasms. 07/30/23   Engler, Morgan C, DO  Multiple Vitamin (MULTIVITAMIN WITH MINERALS) TABS tablet Take 1 tablet by mouth daily. 01/22/23   Angiulli, Everlyn Hockey, PA-C  traZODone  (DESYREL ) 50 MG tablet Take 1 tablet (50 mg total) by mouth at bedtime as needed for sleep. 07/30/23   Engler, Morgan C, DO      Allergies    Patient has no  known allergies.    Review of Systems   Review of Systems  Cardiovascular:  Positive for chest pain.  All other systems reviewed and are negative.   Physical Exam Updated Vital Signs BP (!) 140/93   Pulse (!) 103   Temp 98.3 F (36.8 C)   Resp (!) 25   Ht 5\' 10"  (1.778 m)   Wt 87.5 kg   SpO2 98%   BMI 27.68 kg/m  Physical Exam Vitals and nursing note reviewed.  Constitutional:      Appearance: He is well-developed.     Comments: Smells of EtOH  HENT:     Head: Normocephalic and atraumatic.  Cardiovascular:     Rate and Rhythm: Regular rhythm. Tachycardia present.     Heart sounds: No murmur heard. Pulmonary:     Effort: Pulmonary effort is normal. No respiratory distress.     Breath sounds: Normal breath sounds.  Chest:     Chest  wall: Tenderness present.  Abdominal:     Palpations: Abdomen is soft.     Tenderness: There is no abdominal tenderness. There is no guarding or rebound.  Musculoskeletal:        General: No tenderness.  Skin:    General: Skin is warm and dry.  Neurological:     Mental Status: He is alert and oriented to person, place, and time.     Comments: Nystagmus on horizontal gaze bilaterally.  No asymmetry of facial movements.  5 out of 5 strength in all 4 extremities  Psychiatric:        Behavior: Behavior normal.     ED Results / Procedures / Treatments   Labs (all labs ordered are listed, but only abnormal results are displayed) Labs Reviewed  BASIC METABOLIC PANEL WITH GFR - Abnormal; Notable for the following components:      Result Value   Glucose, Bld 102 (*)    BUN <5 (*)    Calcium 8.7 (*)    All other components within normal limits  CBC - Abnormal; Notable for the following components:   Hemoglobin 18.0 (*)    MCH 35.2 (*)    All other components within normal limits  HEPATIC FUNCTION PANEL - Abnormal; Notable for the following components:   AST 82 (*)    ALT 67 (*)    Alkaline Phosphatase 162 (*)    All other components within normal limits  ETHANOL - Abnormal; Notable for the following components:   Alcohol, Ethyl (B) 307 (*)    All other components within normal limits  MAGNESIUM   TROPONIN I (HIGH SENSITIVITY)  TROPONIN I (HIGH SENSITIVITY)    EKG EKG Interpretation Date/Time:  Sunday Nov 11 2023 00:18:06 EDT Ventricular Rate:  97 PR Interval:  147 QRS Duration:  96 QT Interval:  372 QTC Calculation: 473 R Axis:   99  Text Interpretation: Sinus rhythm Borderline right axis deviation Confirmed by Kelsey Patricia [phone]) on 11/11/2023 12:55:39 AM  Radiology CT CHEST ABDOMEN PELVIS W CONTRAST Result Date: 11/11/2023 CLINICAL DATA:  Alcohol abuse with recurrent seizures. Chest pain, abdominal pain, chest trauma, abnormal mental status EXAM: CT CHEST, ABDOMEN,  AND PELVIS WITH CONTRAST TECHNIQUE: Multidetector CT imaging of the chest, abdomen and pelvis was performed following the standard protocol during bolus administration of intravenous contrast. RADIATION DOSE REDUCTION: This exam was performed according to the departmental dose-optimization program which includes automated exposure control, adjustment of the mA and/or kV according to patient size and/or use of iterative reconstruction technique. CONTRAST:  75mL OMNIPAQUE  IOHEXOL  350 MG/ML SOLN COMPARISON:  Same day chest radiograph; CT 12/08/2022 FINDINGS: CT CHEST FINDINGS Cardiovascular: No pericardial effusion. Normal caliber thoracic aorta without evidence of acute aortic syndrome. Mediastinum/Nodes: Unremarkable trachea and esophagus. No mediastinal hematoma. Lungs/Pleura: No focal consolidation, pleural effusion, or pneumothorax. Mild bronchial wall thickening and mucous plugging in the lower lobes. Musculoskeletal: Acute nondisplaced fracture of the body of the sternum. Additional age-indeterminate superior endplate compression fracture of T11 with less than 10% vertebral body height loss. No retropulsion. This is new since 12/08/2022. CT ABDOMEN PELVIS FINDINGS Hepatobiliary: Hepatic steatosis. Unremarkable gallbladder and biliary tree. Pancreas: Unremarkable. Spleen: Unremarkable. Adrenals/Urinary Tract: Normal adrenal glands. Punctate nonobstructing bilateral nephrolithiasis. No hydronephrosis. Unremarkable bladder. Stomach/Bowel: Normal caliber large and small bowel. Normal appendix. No bowel wall thickening. Stomach is within normal limits. Vascular/Lymphatic: No significant vascular findings are present. No enlarged abdominal or pelvic lymph nodes. Reproductive: Unremarkable. Other: No free intraperitoneal fluid or air. Musculoskeletal: No acute fracture. IMPRESSION: 1. Acute nondisplaced fracture of the body of the sternum. 2. Age-indeterminate superior endplate compression fracture of T11 with less  than 10% vertebral body height loss. No retropulsion. 3. No evidence of acute traumatic injury in the abdomen or pelvis. 4. Hepatic steatosis. 5. Punctate nonobstructing bilateral nephrolithiasis. Electronically Signed   By: Rozell Cornet M.D.   On: 11/11/2023 02:11   CT Head Wo Contrast Result Date: 11/11/2023 CLINICAL DATA:  Head trauma, abnormal mental status (Age 82-64y) Recurrent seizure, alcohol intoxication, syncope. EXAM: CT HEAD WITHOUT CONTRAST TECHNIQUE: Contiguous axial images were obtained from the base of the skull through the vertex without intravenous contrast. RADIATION DOSE REDUCTION: This exam was performed according to the departmental dose-optimization program which includes automated exposure control, adjustment of the mA and/or kV according to patient size and/or use of iterative reconstruction technique. COMPARISON:  10/18/2023 FINDINGS: Brain: No acute intracranial abnormality. Specifically, no hemorrhage, hydrocephalus, mass lesion, acute infarction, or significant intracranial injury. Again noted are areas of encephalomalacia within the anterior left frontal lobe and anterior left temporal lobe, unchanged. Vascular: No hyperdense vessel or unexpected calcification. Skull: No acute calvarial abnormality. Sinuses/Orbits: No acute findings Other: None IMPRESSION: No acute intracranial abnormality. Electronically Signed   By: Janeece Mechanic M.D.   On: 11/11/2023 02:02   DG Chest Port 1 View Result Date: 11/11/2023 CLINICAL DATA:  Chest pain EXAM: PORTABLE CHEST 1 VIEW COMPARISON:  12/20/2022 FINDINGS: Stable cardiomediastinal silhouette. No focal consolidation, pleural effusion, or pneumothorax. No displaced rib fractures. IMPRESSION: No active disease. Electronically Signed   By: Rozell Cornet M.D.   On: 11/11/2023 01:04    Procedures Procedures    Medications Ordered in ED Medications  lidocaine  (LIDODERM ) 5 % 1 patch (1 patch Transdermal Patch Applied 11/11/23 0241)   oxyCODONE -acetaminophen  (PERCOCET/ROXICET) 5-325 MG per tablet 1 tablet (1 tablet Oral Given 11/11/23 0052)  iohexol  (OMNIPAQUE ) 350 MG/ML injection 75 mL (75 mLs Intravenous Contrast Given 11/11/23 0158)  ketorolac  (TORADOL ) 15 MG/ML injection 15 mg (15 mg Intravenous Given 11/11/23 0243)  levETIRAcetam  (KEPPRA ) tablet 1,000 mg (1,000 mg Oral Given 11/11/23 0300)    ED Course/ Medical Decision Making/ A&P                                 Medical Decision Making Amount and/or Complexity of Data Reviewed Labs: ordered. Radiology: ordered.  Risk Prescription drug management.   Patient with history of prior TBI, alcohol use disorder, recent diagnosis of  new onset seizure here for evaluation after unresponsive episode that was unwitnessed.  He did have a loss of bladder.  He has been drinking today.  He has no focal neurologic deficits at time of evaluation.  His only complaint is chest pain, which has been present for the last 3 weeks since he was evaluated for seizure.  He does have tenderness over the chest wall on examination.  Imaging is significant for sternal fracture, T11 fracture.  His alcohol level is elevated.  He is able to ambulate with a steady gait.  He was treated with incentive spirometer, Lidoderm , pain medication.  Discussed with patient, family member over the phone that he will need to avoid alcohol while on pain medication.  Discussed importance of PCP follow-up as well as return precautions.  He was given a dose of Keppra  as he has not picked up the Keppra  that he was prescribed 3 weeks ago.  Current presentation today is inconclusive for breakthrough seizure as there was no clear postictal period.  Feel it is more likely that he had a syncopal episode secondary to intoxication and potentially from his sternal fracture pain.        Final Clinical Impression(s) / ED Diagnoses Final diagnoses:  Syncope, unspecified syncope type  Closed fracture of body of sternum, initial  encounter  Other closed fracture of eleventh thoracic vertebra, initial encounter [facility])  Alcoholic intoxication without complication (HCC)    Rx / DC Orders ED Discharge Orders          Ordered    levETIRAcetam  (KEPPRA ) 500 MG tablet  2 times daily        11/11/23 0257    oxyCODONE  (ROXICODONE ) 5 MG immediate release tablet  Every 6 hours PRN        11/11/23 0257    lidocaine  (LIDODERM ) 5 %  Every 24 hours        11/11/23 0257              Kelsey Patricia, MD 11/11/23 [phone]

## 2024-01-21 ENCOUNTER — Encounter: Admitting: Physical Medicine and Rehabilitation

## 2024-01-21 ENCOUNTER — Encounter: Admitting: Registered Nurse

## 2024-02-20 ENCOUNTER — Encounter: Attending: Physical Medicine and Rehabilitation | Admitting: Physical Medicine and Rehabilitation

## 2024-02-20 ENCOUNTER — Encounter: Payer: Self-pay | Admitting: Physical Medicine and Rehabilitation

## 2024-02-20 ENCOUNTER — Other Ambulatory Visit: Payer: Self-pay | Admitting: Physical Medicine and Rehabilitation

## 2024-02-20 VITALS — BP 106/70 | HR 88 | Ht 70.0 in | Wt 192.0 lb

## 2024-02-20 DIAGNOSIS — R413 Other amnesia: Secondary | ICD-10-CM | POA: Insufficient documentation

## 2024-02-20 DIAGNOSIS — R569 Unspecified convulsions: Secondary | ICD-10-CM | POA: Insufficient documentation

## 2024-02-20 DIAGNOSIS — S069X1D Unspecified intracranial injury with loss of consciousness of 30 minutes or less, subsequent encounter: Secondary | ICD-10-CM | POA: Insufficient documentation

## 2024-02-20 DIAGNOSIS — Z79891 Long term (current) use of opiate analgesic: Secondary | ICD-10-CM | POA: Diagnosis not present

## 2024-02-20 DIAGNOSIS — M5416 Radiculopathy, lumbar region: Secondary | ICD-10-CM | POA: Insufficient documentation

## 2024-02-20 DIAGNOSIS — Z5181 Encounter for therapeutic drug level monitoring: Secondary | ICD-10-CM | POA: Insufficient documentation

## 2024-02-20 DIAGNOSIS — F32A Depression, unspecified: Secondary | ICD-10-CM | POA: Insufficient documentation

## 2024-02-20 DIAGNOSIS — G894 Chronic pain syndrome: Secondary | ICD-10-CM | POA: Insufficient documentation

## 2024-02-20 MED ORDER — LEVETIRACETAM 500 MG PO TABS: 500.0000 mg | ORAL_TABLET | Freq: Two times a day (BID) | ORAL | 1 refills | Status: AC

## 2024-02-20 MED ORDER — MELOXICAM 15 MG PO TABS: 15.0000 mg | ORAL_TABLET | Freq: Every day | ORAL | 1 refills | Status: AC

## 2024-02-20 MED ORDER — PREGABALIN 50 MG PO CAPS: 50.0000 mg | ORAL_CAPSULE | Freq: Three times a day (TID) | ORAL | 3 refills | Status: AC

## 2024-02-20 MED ORDER — TIZANIDINE HCL 4 MG PO TABS: 2.0000 mg | ORAL_TABLET | Freq: Three times a day (TID) | ORAL | 0 refills | Status: AC | PRN

## 2024-02-20 MED ORDER — DULOXETINE HCL 30 MG PO CPEP: ORAL_CAPSULE | ORAL | 3 refills | Status: AC

## 2024-02-20 NOTE — Progress Notes (Unsigned)
 Subjective:    Patient ID: Shawn Mills, male    DOB: Apr 10, 1981, 43 y.o.   MRN: [id number]  HPI   Shawn Mills is a 43 y.o. year old male  who  has a past medical history of Carpal tunnel syndrome, Kidney stones, and TBI (traumatic brain injury) (HCC).  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   Plan from last visit:  Chronic pain syndrome Encounter for therapeutic drug monitoring Encounter for long-term opiate analgesic use Chronic right-sided low back pain with bilateral sciatica -     Ambulatory referral to Neurosurgery   Pain from low back with sensory deficits in RLE consistent with L5-S1 radiculopathy, along with recent falls from sudden RLE weakness. CT lumbar spine 04/2023 showed mild degenerative changes at L4/5 and L5/S1. Attempted to get ESI with Dr. Carilyn but missed appointment due to not having a phone to return calls; at this point wishes for neurosurgical referral to get ESI and coordinate surgical intervention if needed given ongoing severity of symptoms.    For your pain, continue Norco and get Meloxicam  15 mg daily to take for 2 weeks. At the end of 2 weeks, call or message me through mychart to let me know if this has had an effect; if not, we will transition your Norco over to [facility] patch with a gradual wean.   Please look into pro bono PT clinics; without attending these it is unlikely anyone will offer you a back injection.   Local Pro-Bono PT clinics:   1) [facility] [email or web address] Clinic #: [phone]    2) [place] [facility]  [email or web address] Clinic #: [phone]   3) [facility] [email or web address] Clinic #: [phone]    You will not receive early refills or more medication if you do not work with our office on adjustments. This is a warning; any further noncompliance with our pain contract or recommendations, including things like PT and injections, may result in termination from the practice.    Follow up in 2 months   Traumatic brain injury, with loss of consciousness of 30 minutes or less, subsequent encounter Memory deficits -     VITAMIN D 25 Hydroxy (Vit-D Deficiency, Fractures); Future -     B12 and Folate Panel -     TSH + free T4; Future   Please get labs done and reschedule with Dr. Corina for ongoing memory issues. Also, stop topomax.    For sleep, I am prescribing trazodone  50 mg nightly as needed. We can evaluate increasing this further next visit.   Continue Sertraline    Right hip pain -     DG HIP UNILAT W OR W/O PELVIS 2-3 VIEWS RIGHT; Future   Notable R groin pain with SI joint maneuvers on exam today; ? R hip impingement syndrome.    Please follow up with Dr. Carilyn for flouroscopic guided R hip injection as soon as available. Please get xray done before this appointment.     Interval Hx:  - Therapies: none   - Follow ups: He's been hospitalized 4/10 and 5/3 for seizures; had another one 2 nights ago. The second time, he passed out and cracked his sternum and 2 vertebrae in his back.   He saw Dr. Cabbell  in January - he is unsure what happened, he does not think he made the appointment.    - Falls: seizures as above   - DME: none   - Medications: ER doctor started him on oxycontin  and seizure medication; never made a follow up with neurology.    - Other concerns: Recent incarcerated for 12 days for expired tag and DWI without breathalyzer. Got out last month. Corlis dropped charges to driving without a  license only. Up til then he was drinking alcohol, but has stopped since. I've cut out a lot of bad people in my life.   He's trying to get a job because no income is killing me. Melissa had tried to get him disability but the process fell through somewhere.   Endorses worsening hearing loss at both sides, worse on right, as well as loss of teste and smell.   Re: Depression - he saw he WAS suicidal but he met a Programmer, multimedia and started going to church. He says this has helped him significantly - no longer actively suicidal or homicidal but depressed as fuck.   He notes increased issues with anger and frustration; he has a brother at home with an autistic son who will get under his skin. He feels guilty for getting frustrated. He fought 2 guys in jail but has never been physically aggressive with family or friends; he does get verbally abusive with family.   Pain Inventory Average Pain 6 Pain Right Now 7 My pain is constant, sharp, and throb  LOCATION OF PAIN  chest, back, headache  BOWEL Number of stools per week: 7 Oral laxative use No  Type of laxative None  BLADDER Normal    Mobility walk without assistance how many minutes can you walk? No problem walking but it hurts to walk. ability to climb steps?  yes do you drive?  no Do you have any goals in this area?  yes  Function what is your job? Not employed  Do you have any goals in this area?  yes  Neuro/Psych weakness anxiety suicidal thoughts  Prior Studies Any changes since last visit?  yes [facility]  Physicians involved in your care Any changes since last visit?  no   History reviewed. No pertinent family history. Social History   Socioeconomic History   Marital status: Married    Spouse name: Not on file   Number of children: Not on file   Years of education: Not on file   Highest education level: Not on file  Occupational History   Not on file  Tobacco Use   Smoking status: Every Day    Types:  Cigarettes   Smokeless tobacco: Not on file  Vaping Use   Vaping status: Never Used  Substance and Sexual Activity   Alcohol use: Not Currently   Drug use: No   Sexual activity: Yes    Birth control/protection: None  Other Topics Concern   Not on file  Social History Narrative   ** Merged History Encounter **       Social Drivers of Corporate investment banker Strain: Not on file  Food Insecurity: Not on file  Transportation Needs: Not on file  Physical Activity: Not on file  Stress: Not on file  Social Connections: Not on file   Past Surgical History:  Procedure Laterality Date   IR ANGIO INTRA EXTRACRAN SEL COM CAROTID INNOMINATE BILAT MOD SED  12/09/2022   IR ANGIO VERTEBRAL SEL VERTEBRAL BILAT MOD SED  12/09/2022   LITHOTRIPSY     Past Medical History:  Diagnosis Date   Carpal tunnel syndrome    Kidney stones    TBI (traumatic brain injury) (HCC)    Ht 5' 10 (1.778 m)   Wt 192 lb (87.1 kg)   BMI 27.55 kg/m   Opioid Risk Score:   Fall Risk Score:  `1  Depression screen [facility] 2/9     02/20/2024   12:03 PM 07/30/2023   11:42 AM 05/30/2023   11:32 AM 02/28/2023   10:01 AM 01/31/2023    9:59 AM  Depression screen PHQ 2/9  Decreased Interest 2 0 1 1 0  Down, Depressed, Hopeless 2 0 1 1 0  PHQ - 2 Score 4 0 2 2 0  Altered sleeping 0    1  Tired, decreased energy 3    2  Change in appetite 0    0  Feeling bad or failure about yourself  3    2  Trouble concentrating 3    0  Moving slowly or fidgety/restless 3    1  Suicidal thoughts 3    0  PHQ-9 Score 19    6    Review of Systems  Constitutional:        Lack of taste & smell  HENT:  Positive for hearing loss.        Hearing loss right ear  Musculoskeletal:  Positive for back pain.       Sternum pain   Neurological:  Positive for seizures and headaches.       Problem with memory  Psychiatric/Behavioral:  Positive for confusion and suicidal ideas.        Anxiety, depression  All other systems reviewed and  are negative.      Objective:   Physical Exam   PE: Constitution: Appropriate appearance for age. Restless but no apparent distress.  Resp: No respiratory distress. No accessory muscle usage. on RA and CTAB Cardio: Well perfused appearance. No peripheral edema. Abdomen: Nondistended. Nontender.   Psych: Irritable, elevated, pressured speech. Passive SI, no active SI. No HI.  Neuro: AAOx4. + memory deficits, c/b elevated mood and poor attention.   Neurologic Exam:   DTRs: Reflexes were 2+ in bilateral biceps, BR and triceps. Reduced in R patella compared to L.  Babinsky: flexor responses b/l.   Hoffmans: negative b/l Sensory exam: revealed  reduced sensation to light touch in right 5th digit and right lateral knee-- unchanged Motor exam: strength 5/5 throughout bilateral upper extremities; 4/5 RLE HF, KE limited by pain. Otherwise 5/5 bilaterally.   Coordination: Fine motor coordination was normal.   Gait: +antalgic gait favoring left foot; stable   MSK: + TTP throughout R thoracic and lumbar paraspinals + Pain with facet loading -  Slump + mild SLR on R       Assessment & Plan:   Dougles Kimmey is a 43 y.o. year old male  who  has a past medical history of Carpal tunnel syndrome, Kidney stones, and TBI (traumatic brain injury) (HCC).   They are presenting to PM&R clinic for follow up related to traumatic SDH/SAH left frontal lobe and temporal lobe s/p fall off deck, with IPR 6/27-7/15/24, c/b pre-existing now severe RLE radiculopathy.  02/2024: Notable recent seizures with medication noncompliance, alcohol abuse, DUI, incarceration, and aggression (fighting in prison, verbal abuse with family); endorsing passive but no active SI; established with a church and motivated to stay clean, improve his life.  Chronic pain syndrome Encounter for therapeutic drug monitoring Encounter for long-term opiate analgesic use Right lumbar radiculitis  Resume Lyrica  at 50 mg three times  daily, keppra  500 mg twice daily, meloxicam  15 mg daily--prescribed  Stop baclofen  and robaxin . Start Tizanidine  2-4 mg three times daily as needed for back spasms.  Reschedule your appointment with Neurosurgery Dr. Shelagh office for back injections and spinal interventions - this is the SINGLE MOST IMPORTANT THING YOU CAN DO FOR YOUR PAIN Contact is: [facility] [address] [place], [address] [place], [place] [address] [phone]  Follow up with me in 1 month  We cannot prescribe narcotics further given recent alcohol use while under prescription; we will manage your pain through alternate means. ABSTAIN FROM ALCOHOL USE WITH CURRENT MEDICATIONS.   Traumatic brain injury, with loss of consciousness of 30 minutes or less, subsequent encounter Memory deficits Depression with passive SI   Schedule you neuropsychology assessment with Dr. Corina  Start Duloxetine  30 mg daily; after 4 weeks increase to 60 mg daily   Seizures (HCC) -     Ambulatory referral to Neurology  Resume keppra  as above  I am referring you to neurology for new seizures  Other orders -     levETIRAcetam ; Take 1 tablet (500 mg total) by mouth 2 (two) times daily.  Dispense: 60 tablet; Refill: 1 -     Pregabalin ; Take 1 capsule (50 mg total) by mouth 3 (three) times daily.  Dispense: 90 capsule; Refill: 3 -     tiZANidine  HCl; Take 0.5-1 tablets (2-4 mg total) by mouth every 8 (eight) hours as needed for muscle spasms (back pain and spasms).  Dispense: 30 tablet; Refill: 0 -     Meloxicam ; Take 1 tablet (15 mg total) by mouth daily.  Dispense: 30 tablet; Refill: 1 -     DULoxetine  HCl; Take 1 capsule (30 mg total) by mouth daily for 30 days, THEN 2 capsules (60 mg total) daily.  Dispense: 90 capsule; Refill: 3

## 2024-02-20 NOTE — Patient Instructions (Addendum)
 Resume Lyrica at 50 mg three times daily, keppra 500 mg twice daily, meloxicam 15 mg daily  Stop baclofen and robaxin. Start Tizanidine 2-4 mg three times daily as needed for back spasms.  Reschedule your appointment with Neurosurgery Dr. Shelagh office for back injections and spinal interventions - this is the SINGLE MOST IMPORTANT THING YOU CAN DO FOR YOUR PAIN Contact is: [facility] [address] 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 Corina Pickerel Duloxetine 30 mg daily; after 4 weeks increase to 60 mg daily  Follow up with me in 1 month  We cannot prescribe narcotics further given recent alcohol use while under prescription; we will manage your pain through alternate means. ABSTAIN FROM ALCOHOL USE WITH CURRENT MEDICATIONS.

## 2024-02-29 DIAGNOSIS — F32A Depression, unspecified: Secondary | ICD-10-CM | POA: Insufficient documentation

## 2024-03-27 ENCOUNTER — Encounter: Attending: Physical Medicine and Rehabilitation | Admitting: Physical Medicine and Rehabilitation

## 2024-03-27 DIAGNOSIS — R569 Unspecified convulsions: Secondary | ICD-10-CM | POA: Insufficient documentation

## 2024-03-27 DIAGNOSIS — S069X1D Unspecified intracranial injury with loss of consciousness of 30 minutes or less, subsequent encounter: Secondary | ICD-10-CM | POA: Insufficient documentation

## 2024-03-27 DIAGNOSIS — M5416 Radiculopathy, lumbar region: Secondary | ICD-10-CM | POA: Insufficient documentation

## 2024-03-27 DIAGNOSIS — G894 Chronic pain syndrome: Secondary | ICD-10-CM | POA: Insufficient documentation

## 2024-03-27 DIAGNOSIS — Z5181 Encounter for therapeutic drug level monitoring: Secondary | ICD-10-CM | POA: Insufficient documentation

## 2024-03-27 DIAGNOSIS — F32A Depression, unspecified: Secondary | ICD-10-CM | POA: Insufficient documentation

## 2024-03-27 DIAGNOSIS — R413 Other amnesia: Secondary | ICD-10-CM | POA: Insufficient documentation

## 2024-03-27 DIAGNOSIS — Z79891 Long term (current) use of opiate analgesic: Secondary | ICD-10-CM | POA: Insufficient documentation

## 2024-04-16 ENCOUNTER — Encounter: Admitting: Psychology

## 2024-05-22 ENCOUNTER — Encounter: Attending: Physical Medicine and Rehabilitation | Admitting: Psychology

## 2024-05-22 DIAGNOSIS — M5416 Radiculopathy, lumbar region: Secondary | ICD-10-CM | POA: Insufficient documentation

## 2024-05-22 DIAGNOSIS — F32A Depression, unspecified: Secondary | ICD-10-CM | POA: Insufficient documentation

## 2024-05-22 DIAGNOSIS — Z5181 Encounter for therapeutic drug level monitoring: Secondary | ICD-10-CM | POA: Insufficient documentation

## 2024-05-22 DIAGNOSIS — S069X1D Unspecified intracranial injury with loss of consciousness of 30 minutes or less, subsequent encounter: Secondary | ICD-10-CM | POA: Insufficient documentation

## 2024-05-22 DIAGNOSIS — R413 Other amnesia: Secondary | ICD-10-CM | POA: Insufficient documentation

## 2024-05-22 DIAGNOSIS — G894 Chronic pain syndrome: Secondary | ICD-10-CM | POA: Insufficient documentation

## 2024-05-22 DIAGNOSIS — Z79891 Long term (current) use of opiate analgesic: Secondary | ICD-10-CM | POA: Insufficient documentation

## 2024-05-22 DIAGNOSIS — R569 Unspecified convulsions: Secondary | ICD-10-CM | POA: Insufficient documentation

## 2024-05-30 ENCOUNTER — Ambulatory Visit: Admitting: Neurology

## 2024-05-30 ENCOUNTER — Encounter: Payer: Self-pay | Admitting: Neurology

## 2024-08-07 ENCOUNTER — Emergency Department (HOSPITAL_COMMUNITY)

## 2024-08-07 ENCOUNTER — Emergency Department (HOSPITAL_COMMUNITY)
Admission: EM | Admit: 2024-08-07 | Discharge: 2024-08-07 | Disposition: A | Discharge status: Home / Self Care | Attending: Emergency Medicine | Admitting: Emergency Medicine

## 2024-08-07 DIAGNOSIS — S46912A Strain of unspecified muscle, fascia and tendon at shoulder and upper arm level, left arm, initial encounter: Secondary | ICD-10-CM | POA: Insufficient documentation

## 2024-08-07 DIAGNOSIS — X500XXA Overexertion from strenuous movement or load, initial encounter: Secondary | ICD-10-CM | POA: Diagnosis not present

## 2024-08-07 DIAGNOSIS — Z7982 Long term (current) use of aspirin: Secondary | ICD-10-CM | POA: Insufficient documentation

## 2024-08-07 DIAGNOSIS — M25512 Pain in left shoulder: Secondary | ICD-10-CM | POA: Diagnosis present

## 2024-08-07 MED ORDER — KETOROLAC TROMETHAMINE 60 MG/2ML IM SOLN: 30.0000 mg | Freq: Once | INTRAMUSCULAR | Status: DC

## 2024-08-07 MED ORDER — ACETAMINOPHEN 500 MG PO TABS
1000.0000 mg | ORAL_TABLET | Freq: Once | ORAL | Status: AC
  Administered 2024-08-07: 1000 mg via ORAL
  Filled 2024-08-07: qty 2

## 2024-08-07 MED ORDER — KETOROLAC TROMETHAMINE 30 MG/ML IJ SOLN
30.0000 mg | Freq: Once | INTRAMUSCULAR | Status: AC
  Administered 2024-08-07: 30 mg via INTRAMUSCULAR
  Filled 2024-08-07: qty 1

## 2024-08-07 MED ORDER — CYCLOBENZAPRINE HCL 10 MG PO TABS: 10.0000 mg | ORAL_TABLET | Freq: Two times a day (BID) | ORAL | 0 refills | Status: AC | PRN

## 2024-08-07 NOTE — ED Triage Notes (Signed)
 Pt states he was moving heavy stuff on Saturday and has had left shoulder pain since. Pt states it is getting worse every day, unable to straighten arm without pain, holding arm like splint on check in. Pain 8/10

## 2024-08-07 NOTE — ED Provider Notes (Signed)
 " [facility] Provider Note   CSN: [id number] Arrival date & time: 08/07/24  1129     Patient presents with: Shoulder Injury   Shawn Mills is a 44 y.o. male.   Patient presents with left shoulder pain since Saturday when he was moving heavy things and lifting.  No direct fall or trauma.  No history of significant shoulder problems or surgery.  No other injuries.  Patient keeping arm at side difficult moving it to the outside or backwards.  Pain right upper shoulder blade as well.  No weakness or persistent numbness.  Pain worse with movement.  The history is provided by the patient.  Shoulder Injury Pertinent negatives include no chest pain, no abdominal pain, no headaches and no shortness of breath.       Prior to Admission medications  Medication Sig Start Date End Date Taking? Authorizing Provider  cyclobenzaprine  (FLEXERIL ) 10 MG tablet Take 1 tablet (10 mg total) by mouth 2 (two) times daily as needed for muscle spasms. 08/07/24  Yes Tonia Chew, MD  aspirin  81 MG chewable tablet Chew 1 tablet (81 mg total) by mouth daily. Patient not taking: Reported on 02/20/2024 01/22/23   Angiulli, Toribio PARAS, PA-C  DULoxetine  (CYMBALTA ) 30 MG capsule Take 1 capsule (30 mg total) by mouth daily for 30 days, THEN 2 capsules (60 mg total) daily. 02/20/24 04/20/24  Emeline Joesph BROCKS, DO  folic acid  (FOLVITE ) 1 MG tablet Take 1 tablet (1 mg total) by mouth daily. Patient not taking: Reported on 02/20/2024 01/22/23   Angiulli, Toribio PARAS, PA-C  levETIRAcetam  (KEPPRA ) 500 MG tablet Take 1 tablet (500 mg total) by mouth 2 (two) times daily. 02/20/24   Emeline Joesph C, DO  lidocaine  (LIDODERM ) 5 % Place 1 patch onto the skin daily. Remove & Discard patch within 12 hours or as directed by MD Patient not taking: Reported on 02/20/2024 11/11/23   Griselda Norris, MD  meloxicam  (MOBIC ) 15 MG tablet Take 1 tablet (15 mg total) by mouth daily. 02/20/24   Emeline Joesph BROCKS, DO  Multiple Vitamin (MULTIVITAMIN WITH MINERALS) TABS tablet Take 1 tablet by mouth daily. Patient not taking: Reported on 02/20/2024 01/22/23   Angiulli, Toribio PARAS, PA-C  pregabalin  (LYRICA ) 50 MG capsule Take 1 capsule (50 mg total) by mouth 3 (three) times daily. 02/20/24   Emeline Joesph BROCKS, DO  QUEtiapine  (SEROQUEL ) 50 MG tablet Take by mouth. Patient not taking: Reported on 02/20/2024    [provider]  tiZANidine  (ZANAFLEX ) 4 MG tablet Take 0.5-1 tablets (2-4 mg total) by mouth every 8 (eight) hours as needed for muscle spasms (back pain and spasms). 02/20/24   Engler, Morgan C, DO    Allergies: Patient has no known allergies.    Review of Systems  Constitutional:  Negative for chills and fever.  HENT:  Negative for congestion.   Eyes:  Negative for visual disturbance.  Respiratory:  Negative for shortness of breath.   Cardiovascular:  Negative for chest pain.  Gastrointestinal:  Negative for abdominal pain and vomiting.  Genitourinary:  Negative for dysuria and flank pain.  Musculoskeletal:  Positive for back pain. Negative for neck pain and neck stiffness.  Skin:  Negative for rash.  Neurological:  Negative for light-headedness and headaches.    Updated Vital Signs BP (!) 163/99   Pulse 88   Temp 97.7 F (36.5 C)   Resp 18   SpO2 100%   Physical Exam Vitals and nursing  note reviewed.  Constitutional:      General: He is not in acute distress.    Appearance: He is well-developed.  HENT:     Head: Normocephalic and atraumatic.     Mouth/Throat:     Mouth: Mucous membranes are moist.  Eyes:     General:        Right eye: No discharge.        Left eye: No discharge.     Conjunctiva/sclera: Conjunctivae normal.  Neck:     Trachea: No tracheal deviation.  Cardiovascular:     Rate and Rhythm: Normal rate.  Pulmonary:     Effort: Pulmonary effort is normal.     Breath sounds: Normal breath sounds.  Abdominal:     General: There is no distension.   Musculoskeletal:        General: Tenderness present. No swelling.     Cervical back: Normal range of motion and neck supple.  Feet:     Comments: Patient has tenderness tight musculature posterior trapezius extending to medial and inferior scapula on the left.  Patient has pain with significant external rotation or abduction.  No humerus tenderness or superior anterior shoulder.  No warmth or swelling to the shoulder.  No elbow tenderness.  Neurovasc intact left arm.  No midline cervical tenderness. Skin:    General: Skin is warm.     Capillary Refill: Capillary refill takes less than 2 seconds.     Findings: No rash.  Neurological:     General: No focal deficit present.     Mental Status: He is alert.  Psychiatric:        Mood and Affect: Mood normal.     (all labs ordered are listed, but only abnormal results are displayed) Labs Reviewed - No data to display  EKG: None  Radiology: No results found.   Procedures   Medications Ordered in the ED  acetaminophen  (TYLENOL ) tablet 1,000 mg (1,000 mg Oral Given 08/07/24 1325)  ketorolac  (TORADOL ) 30 MG/ML injection 30 mg (30 mg Intramuscular Given 08/07/24 1329)                                    Medical Decision Making Amount and/or Complexity of Data Reviewed Radiology: ordered.  Risk OTC drugs. Prescription drug management.   Patient presents with clinical concern for musculoskeletal injury such as muscle strain, muscle tear, rotator cuff injury, nerve impingement.  No neurodeficits.  X-ray ordered independent reviewed no dislocation or fracture or AC joint injury.  Toradol  shot given, sling for support and follow-up with orthopedics outpatient with work note.  Patient comfortable plan.  X-ray independent reviewed no dislocation or fracture.  Patient stable for outpatient follow-up.     Final diagnoses:  Left shoulder strain, initial encounter    ED Discharge Orders          Ordered    cyclobenzaprine   (FLEXERIL ) 10 MG tablet  2 times daily PRN        08/07/24 1305               Tonia Chew, MD 08/07/24 1349  "

## 2024-08-07 NOTE — Discharge Instructions (Addendum)
 No broken bones seen on your x-ray.  Use ice as needed. Use Tyle every 4 hours and ibuprofen  every 6 as needed for pain.  Use sling as needed for support.  Call for soonest available appointment with orthopedics or sports medicine. Remember ibuprofen /Advil /meloxicam /Motrin  are all very similar so only take 1 type of NSAID. If heat pad not working you can try muscle relaxant but no operating machinery or driving makes you sleepy.

## 2024-08-07 NOTE — ED Notes (Signed)
 Pt very anxious to leave. Reviewed discharge instructions while standing in room doorway with coat on. Pt states he is ready to go. Reviewed discharge instructions including tylenol /motrin  for pain and f/u with providers as needed. States he understands
